# Patient Record
Sex: Male | Born: 1965 | Race: White | Hispanic: No | Smoking: Current every day smoker
Health system: Southern US, Community
[De-identification: ages and names within clinical notes are randomized; demographics above are authoritative.]

## PROBLEM LIST (undated history)

## (undated) DIAGNOSIS — K3184 Gastroparesis: Secondary | ICD-10-CM

## (undated) DIAGNOSIS — I255 Ischemic cardiomyopathy: Secondary | ICD-10-CM

## (undated) DIAGNOSIS — I509 Heart failure, unspecified: Secondary | ICD-10-CM

## (undated) DIAGNOSIS — E119 Type 2 diabetes mellitus without complications: Secondary | ICD-10-CM

## (undated) DIAGNOSIS — Z9114 Patient's other noncompliance with medication regimen: Secondary | ICD-10-CM

## (undated) DIAGNOSIS — I340 Nonrheumatic mitral (valve) insufficiency: Secondary | ICD-10-CM

## (undated) DIAGNOSIS — E785 Hyperlipidemia, unspecified: Secondary | ICD-10-CM

## (undated) DIAGNOSIS — I10 Essential (primary) hypertension: Secondary | ICD-10-CM

## (undated) DIAGNOSIS — I639 Cerebral infarction, unspecified: Secondary | ICD-10-CM

## (undated) DIAGNOSIS — I219 Acute myocardial infarction, unspecified: Secondary | ICD-10-CM

## (undated) DIAGNOSIS — K219 Gastro-esophageal reflux disease without esophagitis: Secondary | ICD-10-CM

## (undated) DIAGNOSIS — K599 Functional intestinal disorder, unspecified: Secondary | ICD-10-CM

## (undated) DIAGNOSIS — I214 Non-ST elevation (NSTEMI) myocardial infarction: Secondary | ICD-10-CM

## (undated) DIAGNOSIS — Z91148 Patient's other noncompliance with medication regimen for other reason: Secondary | ICD-10-CM

## (undated) DIAGNOSIS — G458 Other transient cerebral ischemic attacks and related syndromes: Secondary | ICD-10-CM

## (undated) DIAGNOSIS — I236 Thrombosis of atrium, auricular appendage, and ventricle as current complications following acute myocardial infarction: Secondary | ICD-10-CM

## (undated) DIAGNOSIS — I2510 Atherosclerotic heart disease of native coronary artery without angina pectoris: Secondary | ICD-10-CM

## (undated) HISTORY — DX: Hyperlipidemia, unspecified: E78.5

## (undated) HISTORY — DX: Acute myocardial infarction, unspecified: I21.9

## (undated) HISTORY — DX: Thrombosis of atrium, auricular appendage, and ventricle as current complications following acute myocardial infarction: I23.6

## (undated) HISTORY — DX: Other transient cerebral ischemic attacks and related syndromes: G45.8

## (undated) HISTORY — DX: Heart failure, unspecified: I50.9

## (undated) HISTORY — DX: Type 2 diabetes mellitus without complications: E11.9

## (undated) HISTORY — DX: Patient's other noncompliance with medication regimen for other reason: Z91.148

## (undated) HISTORY — DX: Atherosclerotic heart disease of native coronary artery without angina pectoris: I25.10

## (undated) HISTORY — DX: Nonrheumatic mitral (valve) insufficiency: I34.0

## (undated) HISTORY — DX: Functional intestinal disorder, unspecified: K59.9

## (undated) HISTORY — DX: Gastroparesis: K31.84

## (undated) HISTORY — DX: Patient's other noncompliance with medication regimen: Z91.14

## (undated) HISTORY — DX: Essential (primary) hypertension: I10

## (undated) HISTORY — DX: Ischemic cardiomyopathy: I25.5

---

## 2000-04-16 ENCOUNTER — Encounter: Payer: Self-pay | Admitting: Neurosurgery

## 2000-04-16 ENCOUNTER — Ambulatory Visit (HOSPITAL_COMMUNITY): Admission: RE | Admit: 2000-04-16 | Discharge: 2000-04-16 | Payer: Self-pay | Admitting: Neurosurgery

## 2000-04-23 ENCOUNTER — Encounter: Payer: Self-pay | Admitting: Neurosurgery

## 2000-04-23 ENCOUNTER — Ambulatory Visit (HOSPITAL_COMMUNITY): Admission: RE | Admit: 2000-04-23 | Discharge: 2000-04-23 | Payer: Self-pay | Admitting: Neurosurgery

## 2000-05-14 ENCOUNTER — Ambulatory Visit (HOSPITAL_COMMUNITY): Admission: RE | Admit: 2000-05-14 | Discharge: 2000-05-14 | Payer: Self-pay | Admitting: Neurosurgery

## 2000-05-14 ENCOUNTER — Encounter: Payer: Self-pay | Admitting: Neurosurgery

## 2000-05-28 ENCOUNTER — Encounter: Payer: Self-pay | Admitting: Neurosurgery

## 2000-05-28 ENCOUNTER — Ambulatory Visit (HOSPITAL_COMMUNITY): Admission: RE | Admit: 2000-05-28 | Discharge: 2000-05-28 | Payer: Self-pay | Admitting: Neurosurgery

## 2004-07-02 HISTORY — PX: CORONARY ANGIOPLASTY WITH STENT PLACEMENT: SHX49

## 2004-09-13 ENCOUNTER — Inpatient Hospital Stay (HOSPITAL_COMMUNITY): Admission: AD | Admit: 2004-09-13 | Discharge: 2004-09-20 | Payer: Self-pay | Admitting: Cardiology

## 2004-09-13 ENCOUNTER — Ambulatory Visit: Payer: Self-pay | Admitting: Cardiology

## 2004-09-13 DIAGNOSIS — I219 Acute myocardial infarction, unspecified: Secondary | ICD-10-CM

## 2004-09-13 HISTORY — DX: Acute myocardial infarction, unspecified: I21.9

## 2004-09-14 ENCOUNTER — Encounter: Payer: Self-pay | Admitting: Cardiology

## 2004-09-22 ENCOUNTER — Ambulatory Visit: Payer: Self-pay | Admitting: Internal Medicine

## 2004-09-22 ENCOUNTER — Observation Stay (HOSPITAL_COMMUNITY): Admission: AD | Admit: 2004-09-22 | Discharge: 2004-09-22 | Payer: Self-pay | Admitting: Cardiology

## 2004-09-24 ENCOUNTER — Inpatient Hospital Stay (HOSPITAL_COMMUNITY): Admission: EM | Admit: 2004-09-24 | Discharge: 2004-09-30 | Payer: Self-pay | Admitting: Emergency Medicine

## 2004-09-24 ENCOUNTER — Ambulatory Visit: Payer: Self-pay | Admitting: *Deleted

## 2004-10-11 ENCOUNTER — Ambulatory Visit: Payer: Self-pay | Admitting: Cardiology

## 2004-10-23 ENCOUNTER — Ambulatory Visit: Payer: Self-pay | Admitting: Cardiology

## 2004-10-26 ENCOUNTER — Ambulatory Visit: Payer: Self-pay | Admitting: Cardiology

## 2004-11-17 ENCOUNTER — Ambulatory Visit: Payer: Self-pay | Admitting: Cardiology

## 2005-07-02 HISTORY — PX: CORONARY ARTERY BYPASS GRAFT: SHX141

## 2005-08-30 ENCOUNTER — Ambulatory Visit: Payer: Self-pay | Admitting: Cardiology

## 2005-10-02 ENCOUNTER — Ambulatory Visit: Payer: Self-pay | Admitting: Internal Medicine

## 2005-10-03 ENCOUNTER — Inpatient Hospital Stay (HOSPITAL_COMMUNITY): Admission: EM | Admit: 2005-10-03 | Discharge: 2005-10-04 | Payer: Self-pay | Admitting: Emergency Medicine

## 2005-10-03 ENCOUNTER — Encounter: Payer: Self-pay | Admitting: Cardiology

## 2005-10-04 ENCOUNTER — Encounter: Payer: Self-pay | Admitting: Vascular Surgery

## 2005-10-12 ENCOUNTER — Inpatient Hospital Stay (HOSPITAL_COMMUNITY): Admission: RE | Admit: 2005-10-12 | Discharge: 2005-10-16 | Payer: Self-pay | Admitting: Surgery

## 2005-12-25 ENCOUNTER — Encounter: Admission: RE | Admit: 2005-12-25 | Discharge: 2005-12-25 | Payer: Self-pay | Admitting: Surgery

## 2006-01-04 ENCOUNTER — Ambulatory Visit: Payer: Self-pay | Admitting: Cardiology

## 2006-01-11 ENCOUNTER — Ambulatory Visit: Payer: Self-pay | Admitting: Cardiology

## 2006-01-16 ENCOUNTER — Ambulatory Visit: Payer: Self-pay | Admitting: Cardiology

## 2006-02-04 ENCOUNTER — Ambulatory Visit: Payer: Self-pay | Admitting: Cardiology

## 2006-02-19 ENCOUNTER — Ambulatory Visit: Payer: Self-pay | Admitting: Cardiology

## 2006-02-27 ENCOUNTER — Ambulatory Visit: Payer: Self-pay | Admitting: Cardiology

## 2006-03-26 ENCOUNTER — Ambulatory Visit: Payer: Self-pay | Admitting: Cardiology

## 2006-04-12 ENCOUNTER — Ambulatory Visit: Payer: Self-pay | Admitting: Cardiology

## 2006-07-11 ENCOUNTER — Ambulatory Visit: Payer: Self-pay | Admitting: Cardiology

## 2006-09-04 ENCOUNTER — Encounter: Payer: Self-pay | Admitting: Cardiology

## 2006-09-11 ENCOUNTER — Ambulatory Visit: Payer: Self-pay | Admitting: Cardiology

## 2008-05-10 ENCOUNTER — Encounter: Payer: Self-pay | Admitting: Cardiology

## 2009-08-18 ENCOUNTER — Emergency Department (HOSPITAL_COMMUNITY): Admission: EM | Admit: 2009-08-18 | Discharge: 2009-08-18 | Payer: Self-pay | Admitting: Emergency Medicine

## 2009-12-24 ENCOUNTER — Encounter: Payer: Self-pay | Admitting: Cardiology

## 2010-01-21 ENCOUNTER — Emergency Department (HOSPITAL_COMMUNITY)
Admission: EM | Admit: 2010-01-21 | Discharge: 2010-01-21 | Payer: Self-pay | Source: Home / Self Care | Admitting: Emergency Medicine

## 2010-03-07 ENCOUNTER — Encounter: Payer: Self-pay | Admitting: Cardiology

## 2010-03-13 ENCOUNTER — Encounter: Payer: Self-pay | Admitting: Cardiology

## 2010-04-05 ENCOUNTER — Encounter: Payer: Self-pay | Admitting: Cardiology

## 2010-05-24 ENCOUNTER — Ambulatory Visit: Payer: Self-pay | Admitting: Cardiology

## 2010-05-24 DIAGNOSIS — E1165 Type 2 diabetes mellitus with hyperglycemia: Secondary | ICD-10-CM

## 2010-05-24 DIAGNOSIS — F17200 Nicotine dependence, unspecified, uncomplicated: Secondary | ICD-10-CM

## 2010-05-24 DIAGNOSIS — R0602 Shortness of breath: Secondary | ICD-10-CM

## 2010-05-24 DIAGNOSIS — Z951 Presence of aortocoronary bypass graft: Secondary | ICD-10-CM

## 2010-05-24 DIAGNOSIS — K219 Gastro-esophageal reflux disease without esophagitis: Secondary | ICD-10-CM

## 2010-05-24 DIAGNOSIS — E7800 Pure hypercholesterolemia, unspecified: Secondary | ICD-10-CM

## 2010-05-24 DIAGNOSIS — I509 Heart failure, unspecified: Secondary | ICD-10-CM | POA: Insufficient documentation

## 2010-05-24 DIAGNOSIS — I7389 Other specified peripheral vascular diseases: Secondary | ICD-10-CM | POA: Insufficient documentation

## 2010-05-24 DIAGNOSIS — I2510 Atherosclerotic heart disease of native coronary artery without angina pectoris: Secondary | ICD-10-CM | POA: Insufficient documentation

## 2010-05-24 DIAGNOSIS — Z9119 Patient's noncompliance with other medical treatment and regimen: Secondary | ICD-10-CM

## 2010-05-24 DIAGNOSIS — I252 Old myocardial infarction: Secondary | ICD-10-CM

## 2010-05-24 DIAGNOSIS — I5022 Chronic systolic (congestive) heart failure: Secondary | ICD-10-CM | POA: Insufficient documentation

## 2010-05-24 DIAGNOSIS — I10 Essential (primary) hypertension: Secondary | ICD-10-CM | POA: Insufficient documentation

## 2010-05-24 DIAGNOSIS — J449 Chronic obstructive pulmonary disease, unspecified: Secondary | ICD-10-CM

## 2010-05-29 ENCOUNTER — Telehealth: Payer: Self-pay | Admitting: Cardiology

## 2010-05-29 ENCOUNTER — Encounter: Payer: Self-pay | Admitting: Cardiology

## 2010-06-09 ENCOUNTER — Encounter: Payer: Self-pay | Admitting: Cardiology

## 2010-07-23 ENCOUNTER — Encounter: Payer: Self-pay | Admitting: Surgery

## 2010-07-28 LAB — CBC
HCT: 45.8 % (ref 39.0–52.0)
MCV: 87.6 fL (ref 78.0–100.0)
Platelets: 135 10*3/uL — ABNORMAL LOW (ref 150–400)
RBC: 5.23 MIL/uL (ref 4.22–5.81)
WBC: 9.1 10*3/uL (ref 4.0–10.5)

## 2010-07-28 LAB — COMPREHENSIVE METABOLIC PANEL
ALT: 20 U/L (ref 0–53)
Albumin: 4 g/dL (ref 3.5–5.2)
CO2: 28 mEq/L (ref 19–32)
Calcium: 9.6 mg/dL (ref 8.4–10.5)
Creatinine, Ser: 1.09 mg/dL (ref 0.4–1.5)
Glucose, Bld: 202 mg/dL — ABNORMAL HIGH (ref 70–99)
Potassium: 4.4 mEq/L (ref 3.5–5.1)
Total Protein: 6.6 g/dL (ref 6.0–8.3)

## 2010-07-31 ENCOUNTER — Ambulatory Visit (HOSPITAL_COMMUNITY)
Admission: RE | Admit: 2010-07-31 | Discharge: 2010-07-31 | Payer: Self-pay | Source: Home / Self Care | Attending: Oral Surgery | Admitting: Oral Surgery

## 2010-07-31 LAB — GLUCOSE, CAPILLARY: Glucose-Capillary: 200 mg/dL — ABNORMAL HIGH (ref 70–99)

## 2010-08-01 NOTE — Letter (Signed)
Summary: External Correspondence/ OFFICE VISIT [facility]  External Correspondence/ OFFICE VISIT [facility]   Imported By: Dorise Hiss 04/06/2010 10:46:49  _____________________________________________________________________  External Attachment:    Type:   Image     Comment:   External Document

## 2010-08-01 NOTE — Op Note (Signed)
NAME:  Edwin White, Edwin White NO.:  [id number]  MEDICAL RECORD NO.:  [id number]          PATIENT TYPE:  AMB  LOCATION:  SDS                          FACILITY:  [facility]  PHYSICIAN:  Georgia Lopes, M.D.  DATE OF BIRTH:  [birth year]  DATE OF PROCEDURE:  07/31/2010 DATE OF DISCHARGE:  07/31/2010                              OPERATIVE REPORT   PREOPERATIVE DIAGNOSIS:  Dental caries, nonrestorable teeth #2, #3, #4, #5, #6, #7, #11, #12, #13, #14, #15, #19, #20, #21, #22, #23, #24, #25, #26, #27, #28, #29, #30, and #31.  POSTOPERATIVE DIAGNOSIS:  Dental caries, nonrestorable teeth #2, #3, #4, #5, #6, #7, #11, #12, #13, #14, #15, #19, #20, #21, #22, #23, #24, #25, #26, #27, #28, #29, #30 and #31.  PROCEDURES:  Removal of teeth #2, #3, #4, #5, #6, #7, #11, #12, #13, #14, #15, #19, #20, #21, #22, #23, #24, #25, #26, #27, #28, #29, #30 and #31, alveoplasty maxilla and mandible.  SURGEON:  Georgia Lopes, MD  ASSISTANTS:  __________.  ANESTHESIA:  General nasal, Dr. Gypsy Balsam attending.  INDICATIONS FOR PROCEDURE:  [name] is a 45 year old white male with extensive medical history of recurrent congestive heart failure, ischemic cardiomyopathy with an ejection fraction 15% to 20%, coronary artery disease, mitral regurg, type 2 diabetes, history of apical thrombus, history of multiple TIAs who was referred to my office for removal of all remaining teeth by his general dentist.  Because of the complexity of the surgery, the number of teeth we removed, and the patient's poor medical condition, it was recommended that the patient go to [facility] where he could be intubated and the airway protected and have anesthesiologist monitor the patient during surgery.  PROCEDURE:  The patient was taken to the operating room, placed on the table in a supine position.  General anesthesia was administered intravenously and nasal endotracheal tube was placed and secured.  The eyes were  protected.  Throat pack was suctioned after the patient was draped and then a throat pack was placed.  2% lidocaine 1:100,000 epinephrine was infiltrated in an inferior alveolar block on the right and left side and in maxillary buccal and palatal infiltration, a total of 19 mL was utilized.  A bite block was placed on the right side of the mouth and left side was operated first.  A #15 blade was used to make a full-thickness incision around teeth #19, #20, #21, #22, #23, #24, #25, and #26 in the mandible and around teeth #11, #12, #13, #14, #15 in the maxilla.  The periosteum was reflected around these teeth and then a striker handpiece with a fissure bur was used to remove bone air proximally between teeth #11, #12, #13, #14, #15, #19, #20, #21, #22. Then the teeth were elevated with a 301 elevator and the teeth were removed in the mandible with the #150 universal forceps and in the maxilla with a #150 universal forceps.  The sockets were then curetted, the periosteum was reflected further to expose the alveolar ridge, then alveoplasty was performed with an egg-shaped bur and a bone file and the areas were irrigated  and closed with 3-0 chromic.  The bite block was repositioned.  Attention was turned to the right side of the mouth.  A #15 blade was used to make a full-thickness incision around teeth #27, #28, #29, #30, #31 and #2, #3, #4, #5, #6, #7.  The periosteum was reflected and then bone was removed in approximately between the posterior teeth and the maxilla and mandible.  Then the 301 elevator was used to elevate teeth.  The lower teeth were then removed with a #151 forceps and the maxillary teeth were removed with the #150 forceps. Sockets were then curetted.  The periosteum was further reflected to expose the alveolar crest in both incision areas and then the egg-shaped bur and bone file was used to perform alveoplasty.  The areas were irrigated and then closed with 3-0 chromic.   The oral cavity was then palpated, inspected, and found to have good contour and good closure and then the posterior pharynx was suctioned.  Throat pack was removed.  The patient was taken to the recovery room extubated, breathing spontaneously in good condition.  ESTIMATED BLOOD LOSS:  Minimum.  COMPLICATIONS:  None.  SPECIMENS:  None.     Georgia Lopes, M.D.     SMJ/MEDQ  D:  07/31/2010  T:  08/01/2010  Job:  [id number]  Electronically Signed by Ocie Doyne M.D. on 08/01/2010 07:22:43 AM

## 2010-08-01 NOTE — Progress Notes (Signed)
Summary: PHONE: PAIN IN LEGS   Phone Note Call from Patient Call back at Home Phone [phone]   Caller: Joana Reamer- cell # [phone] Reason for Call: Talk to Doctor Summary of Call: Had ABI's done today. Mr. Gasper thought he was suppose to have an Echo but we see no mention of this. Patient is now having pain in both legs from hips down. Want to know if he can have something for pain. Patient has started taking Lortab 500 mg that was given to [facility] in [place], [place] please call French Ana on her cell # [phone] Initial call taken by: Zachary George,  May 29, 2010 3:16 PM  Follow-up for Phone Call        Discussed above with wife.  Advised her that ABI results not back yet.  Also thought you mentioned him having echo done. Hoover Brunette, LPN  May 30, 2010 10:35 AM   Additional Follow-up for Phone Call Additional follow up Details #1::        CALLED TODAY FOR ABI RESULTS    RETURN CALL ON CELL [phone] Dorise Hiss  June 02, 2010 2:51 PM Additional Follow-up by: Dorise Hiss,  June 02, 2010 2:41 PM    Additional Follow-up for Phone Call Additional follow up Details #2::    call patient to reassured that ABI results are normal. Follow-up by: Lewayne Bunting, MD, [facility],  June 09, 2010 9:17 AM  Additional Follow-up for Phone Call Additional follow up Details #3:: Details for Additional Follow-up Action Taken: Does patient need to have echo?   Was planning to have dental work done.  Thinks he was suppose to from conversation during OV.  Dictation states echo "at some point" .  Hoover Brunette, LPN  June 09, 2010 11:15 AM  Does not need echo for dental work at this point. Can be addressed next OV.  Lewayne Bunting, MD, [facility]  June 09, 2010 11:28 AM  Patient notified.    Hoover Brunette, LPN  June 09, 2010 11:41 AM

## 2010-08-01 NOTE — Letter (Signed)
Summary: External Correspondence/ [facility]ICE [facility]IT [facility]  External Correspondence/ [facility]ICE [facility]   Imported By: Dorise Hiss 04/06/2010 10:48:22  _____________________________________________________________________  External Attachment:    Type:   Image     Comment:   External Document

## 2010-08-01 NOTE — Letter (Signed)
Summary: External Correspondence/ MEDICAL  CLEARANCE DR. [facility]  External Correspondence/ MEDICAL  CLEARANCE DR. [facility]   Imported By: Dorise Hiss 05/04/2010 15:35:47  _____________________________________________________________________  External Attachment:    Type:   Image     Comment:   External Document

## 2010-08-01 NOTE — Medication Information (Signed)
Summary: RX Folder/ [facility]  RX Folder/ [facility]   Imported By: Dorise Hiss 04/06/2010 11:03:44  _____________________________________________________________________  External Attachment:    Type:   Image     Comment:   External Document

## 2010-08-01 NOTE — Letter (Signed)
Summary: Letter/ DR. Sheliah Plane FOR MEDICAL CLEARANCE  Letter/ DR. Sheliah Plane FOR MEDICAL CLEARANCE   Imported By: Dorise Hiss 06/09/2010 12:16:05  _____________________________________________________________________  External Attachment:    Type:   Image     Comment:   External Document

## 2010-08-01 NOTE — Assessment & Plan Note (Signed)
Summary: EST- NEEDS SURGICAL CLEARANCE   Visit Type:  Pre-op Evaluation Primary Edwin White:  Dr. Franchot Mimes   History of Present Illness: the patient has an ischemic cardiomyopathy with prior coronary artery bypass grafting. He also has right middle cerebral artery stenosis not amenable to interventional procedure. Laboratory work from August 2011 shows triglycerides of 218 cholesterol 206 HDL cholesterol 35 LDL cholesterol 127. echocardiogram from 2009 shows an ejection fraction of 35%. This was done the patient's primary care office. No chf admissions. No scp or sob.  Leg pain last few months. Legs hurting when walking. Nu,mess/neuropathy in hands.  Feels likes walking on needles. Poor dentition-ull dental extraction. Needs clearance. A I and exercise BI. Only right dorsal is pedis is palpable. Feet feel cold all the time. Echo, increase lisinopril, ABI's ICD decision Plavix DC 7 days before.   Preventive Screening-Counseling & Management  Alcohol-Tobacco     Smoking Status: current     Packs/Day: 1.0  Comments: started smoking at 45 yrs old  Current Medications (verified): 1)  Furosemide 80 Mg Tabs (Furosemide) .... Take 1 Tablet By Mouth Once A Day 2)  Aspirin Ec 325 Mg Tbec (Aspirin) .... Take 2  Tablet By Mouth Daily 3)  Glyburide 5 Mg Tabs (Glyburide) .... Take 1 Tablet By Mouth One To 2 Times Per Day 4)  Lisinopril 20 Mg Tabs (Lisinopril) .... Take 1 Tablet By Mouth Once A Day 5)  Plavix 75 Mg Tabs (Clopidogrel Bisulfate) .... Take One Tablet By Mouth Daily 6)  Pravastatin Sodium 20 Mg Tabs (Pravastatin Sodium) .... Take One Tablet By Mouth Daily At Bedtime 7)  Nifedipine 30 Mg Xr24h-Tab (Nifedipine) .... Take 1 Tablet By Mouth Once A Day 8)  Potassium Chloride Crys Cr 20 Meq Cr-Tabs (Potassium Chloride Crys Cr) .... Take One Tablet By Mouth Daily 9)  Bisoprolol-Hydrochlorothiazide 5-6.25 Mg Tabs (Bisoprolol-Hydrochlorothiazide) .... Take 1 Tablet By Mouth Once A Day 10)   Lantus Solostar 100 Unit/ml Soln (Insulin Glargine) .... Inject Subcutaneously As Needed As Directed  Allergies (verified): No Known Drug Allergies  Comments:  Nurse/Medical Assistant: The patient's medications were reviewed with the patient and were updated in the Medication List. Pt brought medication bottles to office visit.  Cyril Loosen, RN, BSN (May 24, 2010 1:50 PM)  Past History:  Past Medical History: Last updated: 05/24/2010  1.  Recurrent congestive heart failure.      1.  Currently compensated.  2.  Severe ischemic cardiomyopathy.      1.  Ejection fraction 15-20% by current echocardiogram.  3.  Coronary artery disease.      1.  Status post three vessel coronary artery bypass graft April 2007:          left internal mammary artery to left anterior descending; saphenous          vein graft to first obtuse marginal; saphenous vein graft to right          coronary artery.  4.  Medication noncompliance.      1.  Currently reporting compliance.  5.  Ongoing tobacco.  6.  Moderate mitral regurgitation.  7.  Type 2 diabetes mellitus.  8.  History of apical thrombus.      1.  Previously treated with Coumadin.  9.  History of multiple transient ischemic attacks.      1.  Secondary to right middle cerebral artery stenosis - not amendable          to intervention, treated with Plavix.  10.  Hyperlipidemia. 11. MI September 13, 2004 and had a Cypher stent  Past Surgical History: Last updated: 05/24/2010 CABG Cardiac Catheterization  Social History: Last updated: 05/24/2010 Disabled  Tobacco Use - Yes.   Risk Factors: Smoking Status: current (05/24/2010) Packs/Day: 1.0 (05/24/2010)  Social History: Packs/Day:  1.0  Review of Systems       The patient complains of muscle weakness and joint pain.  The patient denies fatigue, malaise, fever, weight gain/loss, vision loss, decreased hearing, hoarseness, chest pain, palpitations, shortness of breath, prolonged  cough, wheezing, sleep apnea, coughing up blood, abdominal pain, blood in stool, nausea, vomiting, diarrhea, heartburn, incontinence, blood in urine, leg swelling, rash, skin lesions, headache, fainting, dizziness, depression, anxiety, enlarged lymph nodes, easy bruising or bleeding, and environmental allergies.    Vital Signs:  Patient profile:   45 year old male Height:      72 inches Weight:      233.25 pounds BMI:     31.75 Pulse rate:   74 / minute BP sitting:   128 / 87  (left arm) Cuff size:   regular  Vitals Entered By: Cyril Loosen, RN, BSN (May 24, 2010 1:41 PM)  Nutrition Counseling: Patient's BMI is greater than 25 and therefore counseled on weight management options. Comments Surgical clearance for dental work. Pt will have teeth pulled with anes. C/o legs hurting (all the time) for the last 3 months. Concerned about BP staying up despite taking meds. Also wants to know about the blood clot in the back of his head.    Physical Exam  Additional Exam:  General: Well-developed, well-nourished in no distress head: Normocephalic and atraumatic eyes PERRLA/EOMI intact, conjunctiva and lids normal nose: No deformity or lesions mouthpoor dentition normal posterior pharynx neck: Supple, no JVD.  No masses, thyromegaly or abnormal cervical nodes lungs: Normal breath sounds bilaterally without wheezing.  Normal percussion heart: regular rate and rhythm with normal S1 and S2, no S3 or S4.  PMI is normal.  No pathological murmurs abdomen: Normal bowel sounds, abdomen is soft and nontender without masses, organomegaly or hernias noted.  No hepatosplenomegaly musculoskeletal: Back normal, normal gait muscle strength and tone normal pulsus: unable to palpate pulses Extremities: No peripheral pitting edema neurologic: Alert and oriented x 3 skin: Intact without lesions or rashes cervical nodes: No significant adenopathy psychologic: Normal affect    Impression &  Recommendations:  Problem # 1:  POSTSURGICAL AORTOCORONARY BYPASS STATUS (ICD-V45.81) the patient is status post coronary artery bypass grafting.  He has no recurrent chest pain.  He started on Plavix which can be discontinued prior to full mouth dental extraction.  Typically Plavix does not need to be held for dental extraction but given the fact it is a full mouth dental extraction certainly this is indicated.  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  He appears to have claudication.  We will order ABIs.  Problem # 3:  CHRONIC SYSTOLIC HEART FAILURE (ICD-428.22) the patient has LV systolic dysfunction and increase his lisinopril.  At some point will need a follow-up echocardiogram and decide on ICD. The following medications were removed from the medication list:    Nitrostat 0.4 Mg Subl (Nitroglycerin) .Marland Kitchen... 1 tablet under tongue at onset of chest pain; you may repeat every 5 minutes for up to 3 doses. His updated medication list for this problem includes:    Furosemide 80  Mg Tabs (Furosemide) .Marland Kitchen... Take 1 tablet by mouth once a day    Aspirin Ec 325 Mg Tbec (Aspirin) .Marland Kitchen... Take 2  tablet by mouth daily    Lisinopril 20 Mg Tabs (Lisinopril) .Marland Kitchen... Take 1 tablet by mouth once a day    Plavix 75 Mg Tabs (Clopidogrel bisulfate) .Marland Kitchen... Take one tablet by mouth daily    Nifedipine 30 Mg Xr24h-tab (Nifedipine) .Marland Kitchen... Take 1 tablet by mouth once a day    Bisoprolol-hydrochlorothiazide 5-6.25 Mg Tabs (Bisoprolol-hydrochlorothiazide) .Marland Kitchen... Take 1 tablet by mouth once a day  Orders: ABI (ABI)  Other Orders: EKG w/ Interpretation (93000)  Patient Instructions: 1)  ABI's  2)  Stop Plavix 7 days before dental surgery 3)  Increase Lisinopril to 20mg  daily 4)  Follow up in  6 months.   Prescriptions: LISINOPRIL 20 MG TABS (LISINOPRIL) Take 1 tablet by mouth once a day  #30 x 6    Entered by:   Hoover Brunette, LPN   Authorized by:   Lewayne Bunting, MD, [facility]   Signed by:   Hoover Brunette, LPN on 93/23/5573   Method used:   Electronically to        [facility]  [address] (retail)       [address]       [place], [place]  [address]       Ph: [phone]       Fax: [phone]   RxID:   [id number]

## 2010-09-21 LAB — COMPREHENSIVE METABOLIC PANEL
ALT: 21 U/L (ref 0–53)
BUN: 14 mg/dL (ref 6–23)
CO2: 23 mEq/L (ref 19–32)
Calcium: 9.6 mg/dL (ref 8.4–10.5)
Creatinine, Ser: 0.91 mg/dL (ref 0.4–1.5)
GFR calc non Af Amer: >60 mL/min (ref >60)
Glucose, Bld: 263 mg/dL — ABNORMAL HIGH (ref 70–99)
Total Protein: 7 g/dL (ref 6.0–8.3)

## 2010-09-21 LAB — APTT: aPTT: 28 seconds (ref 24–37)

## 2010-09-21 LAB — CBC
HCT: 45.3 % (ref 39.0–52.0)
Hemoglobin: 15.8 g/dL (ref 13.0–17.0)
MCHC: 34.8 g/dL (ref 30.0–36.0)
MCV: 91.7 fL (ref 78.0–100.0)
MCV: 91.7 fL (ref 78.0–100.0)
Platelets: 167 10*3/uL (ref 150–400)
RBC: 4.94 MIL/uL (ref 4.22–5.81)
RDW: 13.8 % (ref 11.5–15.5)
RDW: 13.8 % (ref 11.5–15.5)
WBC: 10.5 10*3/uL (ref 4.0–10.5)

## 2010-09-21 LAB — POCT CARDIAC MARKERS
CKMB, poc: <1 ng/mL — ABNORMAL LOW (ref 1.0–8.0)
Troponin i, poc: <0.05 ng/mL (ref 0.00–0.09)
Troponin i, poc: <0.05 ng/mL (ref 0.00–0.09)

## 2010-09-21 LAB — PROTIME-INR
INR: 0.98 (ref 0.00–1.49)
Prothrombin Time: 12.9 seconds (ref 11.6–15.2)

## 2010-09-21 LAB — DIFFERENTIAL
Basophils Absolute: 0.1 10*3/uL (ref 0.0–0.1)
Eosinophils Absolute: 0 10*3/uL (ref 0.0–0.7)
Eosinophils Absolute: 0.1 10*3/uL (ref 0.0–0.7)
Lymphocytes Relative: 20 % (ref 12–46)
Lymphocytes Relative: 21 % (ref 12–46)
Lymphs Abs: 2.1 10*3/uL (ref 0.7–4.0)
Lymphs Abs: 2.2 10*3/uL (ref 0.7–4.0)
Monocytes Relative: 4 % (ref 3–12)
Neutro Abs: 8.1 10*3/uL — ABNORMAL HIGH (ref 1.7–7.7)
Neutrophils Relative %: 73 % (ref 43–77)
Neutrophils Relative %: 76 % (ref 43–77)

## 2010-09-21 LAB — D-DIMER, QUANTITATIVE: D-Dimer, Quant: 0.4 ug/mL-FEU (ref 0.00–0.48)

## 2010-09-21 LAB — BASIC METABOLIC PANEL
BUN: 15 mg/dL (ref 6–23)
Creatinine, Ser: 0.87 mg/dL (ref 0.4–1.5)
GFR calc non Af Amer: >60 mL/min (ref >60)
Glucose, Bld: 302 mg/dL — ABNORMAL HIGH (ref 70–99)

## 2010-11-17 NOTE — Consult Note (Signed)
NAME:  Edwin White, Edwin White NO.:  [id number]   MEDICAL RECORD NO.:  [id number]           PATIENT TYPE:   LOCATION:                                 FACILITY:   PHYSICIAN:  Theodore Demark, P.A. LHCDATE OF BIRTH:  1966/02/17   DATE OF CONSULTATION:  DATE OF DISCHARGE:  09/30/2004                                   CONSULTATION   PRIMARY CARDIOLOGIST:  Jonelle Sidle, M.D. [facility]   PRIMARY CARE PHYSICIAN:  Dr. Sherryll Burger.   CHIEF COMPLAINT:  Left-sided weakness.   SECONDARY DIAGNOSES:  1.  Coronary artery disease, status post Cypher stent to the ostial LAD in      2006 secondary to anterior wall myocardial infarction.  2.  Ischemic cardiomyopathy with an EF of 20% and an apical thrombus at      cath.  3.  Diabetes.  4.  Hypertension.  5.  Gastroesophageal reflux disease symptoms.  6.  History of sternal fracture.  7.  History of priapism.  8.  Hyperlipidemia.  9.  History of back surgery.  10. Occasional melena and hematuria.   PROCEDURE:  1.  MRI/MRA of the brain.  2.  CT of the head without contrast.  3.  Carotid Doppler's.   HOSPITAL COURSE:  [name] is a 45 year old male with a history of  coronary artery disease.  He had an MI September 13, 2004 and had a Cypher stent  to the LAD.  He had a left upper extremity and lower extremity weakness  since leaving the hospital and his symptoms worsened.  He was admitted for  further evaluation and treatment and a neurological consult was called.   He was seen by neurology and evaluated by Dr. Thad Ranger.  His exam was normal  at the time of the dictation but Dr. Thad Ranger felt that because the MRI and  MRA showed focal segmental area of severe stenosis involving the right  middle cerebral artery he felt that the patient could be having hemodynamic  right hemispheric TIA's.  An interventional radiology consult was called.   [name] was seen by Dr. Corliss Skains regarding cerebral angiogram with  possible angioplasty of the  right cerebral artery.  This was performed on  September 29, 2004.  There was a stump of the proximal right middle cerebral  artery with extensive lenticular strip laterals opacifying the trifurcation  branches of the right middle cerebral artery which are attenuated.  The  tight focal stenosis of the left posterior cerebral artery was noted  proximally.  No percutaneous intervention was done.   [name] was anticoagulated with heparin and Coumadin.  The plan was for  full cross coverage but on September 30, 2004 [name] left the hospital against  medical advice.  His INR at that time was 1.4.  He was given instructions on  Coumadin and it is hoped that he will make and keep followup appointments.  Of note he had been prescribed Coumadin 10 mg a day prior to admission and  his INR on admission was therapeutic at 3.0.   [name] left AMA  on September 30, 2004.      Theodore Demark, P.A. [facility]     RB/MEDQ  D:  05/23/2005  T:  05/24/2005  Job:  [id number]   cc:   Casimiro Needle L. Thad Ranger, M.D.  Fax: [phone]   Kirstie Peri, MD  Fax: [phone]

## 2010-11-17 NOTE — H&P (Signed)
NAME:  Edwin White, CUFF NO.:  [id number]   MEDICAL RECORD NO.:  [id number]          PATIENT TYPE:  INP   LOCATION:  2308                         FACILITY:  [facility]   PHYSICIAN:  Jonelle Sidle, M.D. LHCDATE OF BIRTH:  15-Jul-1965   DATE OF ADMISSION:  09/13/2004  DATE OF DISCHARGE:                                HISTORY & PHYSICAL   PRIMARY CARE PHYSICIAN:  Dr. Kirstie Peri in [facility].   CHIEF COMPLAINT:  Chest pain.   HISTORY OF PRESENT ILLNESS:  [name] is a 45 year old male with a  reported history of hypertension, type 2 diabetes mellitus, dyslipidemia,  gastroesophageal reflux disease, and ongoing tobacco use. He states that he  awoke in the early morning hours with chest pressure at approximately 3 a.m.  There was associated radiation of discomfort to his arms and also shortness  of breath. These symptoms waxed and waned overnight. However, he got up this  morning and went to work as usual. At the urging of his employer and family,  he ultimately presented to the emergency department at [facility]. He was noted to have Q waves in the anteroseptal leads and just  under a millimeter of ST segment elevation in leads V1-V3. This represented  a change from previous tracing in October 2005 and was also associated with  an elevated troponin I level of 14. He was placed on heparin, Integrilin,  and also treated with aspirin and nitroglycerin. He is now transported to  our facility in anticipation of diagnostic angiography. On arrival in the  unit he was complaining of mild residual chest pressure and his  electrocardiogram continues to show minimal ST segment elevation in leads V1-  V5 with Q waves in leads V1-V3. After discussing the risks and benefits of  diagnostic coronary angiography with an eye towards revascularization, he is  being transported to the cardiac catheterization laboratory in anticipation  of such.   ALLERGIES:  No known drug allergies.   PRESENT MEDICATIONS:  These are not entirely clear at this time. He takes  Glucovance and Actos, as well as a medication for reflux disease and some  type of medication for cholesterol.   PAST MEDICAL HISTORY:  1.  Type 2 diabetes mellitus.  2.  Dyslipidemia.  3.  Hypertension.  4.  Gastroesophageal reflux disease.  5.  History of previous injury in the 1980s with subsequent sternal      fracture.  6.  Status post reported presentation with priapism associated reportedly      with sickle cell, treated at [facility] in [place].      I do not have any other specifics in this regard at this time.  7.  No clearly documented prior history of coronary artery disease or      myocardial infarction.   SOCIAL HISTORY:  The patient works at [employer] in [place], [place]. He has a one-pack-per-day tobacco use history for several years.   FAMILY HISTORY:  Significant for premature cardiovascular disease. The  patient states that his father died  between the ages of 75 and 62 with heart  disease.   REVIEW OF SYMPTOMS:  As described in history of present illness. He has had  intermittent chest discomfort for the past week, although thought it may  have been due to reflux. He has had no fevers or chills, cough, hemoptysis,  melena, hematochezia, peripheral edema, palpitations, or syncope.   PHYSICAL EXAMINATION:  VITAL SIGNS:  Temperature is 98.4 degrees, heart rate  90 in sinus rhythm, blood pressure is 114/51, oxygen saturation is 97% on  room air.  GENERAL:  This is an overweight male lying supine in no acute distress.  NECK:  Reveals no elevated jugular venous pressure or bilateral carotid  bruits. No thyromegaly is noted.  LUNGS:  Clear to auscultation bilaterally with nonlabored breathing at rest.  CARDIAC:  Reveals a regular rate and rhythm with a soft S3 but no  pericardial rub or loud murmur.  ABDOMEN:  Soft,  nontender, with normal active bowel sounds.  EXTREMITIES:  Show no pitting edema. Peripheral pulses are 2+.   LABORATORY DATA:  From [facility] shows a glucose of 214,  BUN 8, creatinine 0.9, sodium 138, potassium 4.7, chloride 105, bicarb 26.  Initial CK 974, CK-MB 92, relative index 9.4%, troponin I 14.15. Wbc's 12.5,  hemoglobin 16.1, hematocrit 46.2, platelets 208. INR 1.0.   Chest x-ray is pending.   IMPRESSION:  1.  Acute coronary syndrome/anterior wall myocardial infarction with symptom      onset approximately 3 a.m. Mild residual chest pressure remains.  2.  Type 2 diabetes mellitus.  3.  Hypertension.  4.  Dyslipidemia.  5.  Ongoing tobacco use history.  6.  Reported history of gastroesophageal reflux disease.  7.  Reported history of priapism associated with sickle cell apparently      evaluated at [facility] in [place], specifics not      available.   PLAN:  1.  The patient will be transported urgently to the cardiac catheterization      lab for diagnostic angiography with an eye towards revascularization.      The risks and benefits have been explained to the patient and he agrees      to proceed.  2.  Will continue aspirin, Integrilin, and heparin for the time being.  3.  Smoking cessation consult.  4.  Follow up lipid status.  5.  Need to obtain outpatient medication list and update appropriately.  6.  Further plans to follow.      SGM/MEDQ  D:  09/13/2004  T:  09/13/2004  Job:  [id number]   cc:   Kirstie Peri, MD  [address]  [place] [address]  Fax: [phone]

## 2010-11-17 NOTE — Op Note (Signed)
NAME:  CAIUS, SILBERNAGEL NO.:  [id number]   MEDICAL RECORD NO.:  [id number]          PATIENT TYPE:  INP   LOCATION:  3704                         FACILITY:  [facility]   PHYSICIAN:  Salvadore Farber, M.D. LHCDATE OF BIRTH:  Nov 24, 1965   DATE OF PROCEDURE:  09/22/2004  DATE OF DISCHARGE:  09/22/2004                                 OPERATIVE REPORT   PROCEDURE:  Thrombin injection of right groin pseudoaneurysm.   INDICATION:  Mr. Klich is a 45 year old gentleman who suffered anterior  myocardial infarction one week ago.  Underwent angiography and percutaneous  revascularization of the LAD.  He has had persistent groin pain since.  He  has been managed on Coumadin, Plavix, and aspirin.  This morning he was seen  at our office in [place].  INR was 7.5.  CT done at [facility]  demonstrated right groin pseudoaneurysm.  He was referred to [facility] for  definitive therapy.  Ultrasound demonstrated a reasonable neck on the  aneurysm.  I decided to proceed with thrombin injection.   PROCEDURAL TECHNIQUE:  Using ultrasound guidance, after prepping the site  with Betadine and local anesthesia with 1% lidocaine, a spinal needle was  advanced into the pseudoaneurysm cavity.  Entry into the cavity was  confirmed by ultrasound.  Approximately 500 IU of thrombin was injected into  the cavity.  This resulted in prompt thrombosis of the entirety of the  pseudoaneurysm cavity.  Ultrasound confirmed persistent flow in the common  femoral vein and common femoral artery.   COMPLICATIONS:  None.   IMPRESSION/RECOMMENDATIONS:  Successful thrombin injection of right groin  pseudoaneurysm.  Will discharge home later today with pain medicines.      WED/MEDQ  D:  09/22/2004  T:  09/24/2004  Job:  [id number]

## 2010-11-17 NOTE — H&P (Signed)
NAME:  ASA, [name] NO.:  [id number]   MEDICAL RECORD NO.:  [id number]          PATIENT TYPE:  OBV   LOCATION:  1823                         FACILITY:  [facility]   PHYSICIAN:  Creta Levin, M.D. LHCDATE OF BIRTH:  [birth year]   DATE OF ADMISSION:  09/24/2004  DATE OF DISCHARGE:                                HISTORY & PHYSICAL   PHYSICIANS:  1.  Jonelle Sidle, M.D., primary cardiologist.  2.  Kirstie Peri, M.D., primary care physician.   CHIEF COMPLAINT:  Left-sided weakness.   HISTORY OF PRESENT ILLNESS:  Edwin White is a 45 year old male with a recent  inferior MI who presents with left upper extremity and left lower extremity  weakness. He states that he has had these symptoms since leaving the  hospital but they have gotten significantly worse since earlier today. He  does not describe them very well but he does admit to some difficulty with  walking being the major problem. In fact, it sounds like he really  describing some gait instability/incoordination. He has had no visual  disturbance, dysarthria, dysphagia, sensory deficits, or paresthesias. He  states that he did have some mild substernal chest discomfort that was  atypical but similar to that when he had his myocardial infarction. He also  states that he was seen in clinic on Friday and was told that his INR was  7.5 but that no changes were made to his Coumadin regimen.   PAST MEDICAL HISTORY:  1.  Coronary artery disease.      1.  Acute myocardial infarction on September 13, 2004 treated with primary          PCI. He received a 3.0 x 18 mm Cypher stent to the ostial LAD.  2.  Ischemic cardiomyopathy with an ejection fraction of 20-30%.  3.  Possible apical thrombus.  4.  Diabetes.  5.  Gastroesophageal reflux disease.  6.  History of a sternal fracture.  7.  History of priapism in the past, at which time he was told that he has      sickle cell trait (details are unknown).  8.   Hyperlipidemia.   ALLERGIES:  No known drug allergies.   MEDICATIONS:  1.  Coumadin 10 mg q.h.s.  2.  Lipitor 80 mg q.h.s.  3.  Glucovance 5/500 mg q.d.  4.  Actos 30 mg q.d.  5.  Aspirin 81 mg q.d.  6.  Plavix 75 mg q.d.  7.  Coreg 6.25 mg b.i.d.  8.  Lisinopril 20 mg q.d.  9.  Aldactone 25 mg q.d.  10. Nicotine patch 7 mg q.d.  11. Aciphex 20 mg q.d.  12. Nitroglycerin p.r.n., although he has not needed it.   SOCIAL HISTORY:  [place]. He works at  [employer]. He has a 24-pack-year smoking history and is now smoking  one-quarter pack per day. He has three boys-ages 11, 3, and 2. He denies  alcohol or illicit drug use. He does not use herbal medications.   FAMILY HISTORY:  His mother  is alive in her 64s, although he does not know  much about her. His father died between age 35 and 48 of coronary disease.  He states he has two brothers with coronary disease in their 40s and one  sister with coronary disease in her 56s.   REVIEW OF SYMPTOMS:  Positive for chest pain that is described as mild  epigastric/chest discomfort associated with belching, shortness of breath,  and weakness/gait instability. He also complains of right groin pain and was  recently seen back in the emergency department for a groin hematoma and  vascular complication that may have been treated with thrombin injection.   PHYSICAL EXAMINATION:  VITAL SIGNS:  Temperature is 98.5, blood pressure  119/73, heart rate is 86, respiratory rate is 18.  GENERAL:  In general, he is a poorly kempt 45 year old male in no acute  distress.  HEENT:  Generally unremarkable. His dentition was poor.  NECK:  JVP normal. Carotid upstroke normal. No bruits.  LUNGS:  Clear.  CARDIOVASCULAR:  Regular rate and rhythm with a nondisplaced PMI. He did  have an S3. He had no audible murmurs.  ABDOMEN:  Soft, nontender, and nondistended without masses or bruits.  GENITOURINARY:  He had  diffuse ecchymosis involving his right vascular  access site. This appeared stable. It was tender. There was no bruit.  EXTREMITIES:  No clubbing, cyanosis, or edema. His distal pulses were  strong.  NEUROLOGICAL:  Cranial nerves II through XII intact. He was alert and  oriented x 4. He had normal motor and sensory exams. Deep tendon reflexes  were equal throughout. He had slightly abnormal finger-to-nose but otherwise  normal cerebellar and proprioceptive tests.   LABORATORY DATA:  Chest x-ray is currently pending. ECG showed a normal  sinus rhythm with low anterior forces and some nonspecific ST changes. He  did have an IVCD. He had no obvious injury or ischemia.   His labs thus far show a white count of 17.2 with 85% neutrophils, the  hemoglobin 13.6, hematocrit 38.9, platelets 240,000. Sodium 135, potassium  4.7, chloride 102, bicarbonate 31, BUN 13, creatinine 0.8, glucose 131. PTT  71, PT 24.3, with an INR of 3.0.   ASSESSMENT/PLAN:  1.  Possible transient ischemic attack:  Dr. Doug Sou evaluated the      patient in the emergency department and contacted Dr. Melvyn Novas.      She felt that the patient would best be admitted to the cardiology      service. We will ask for her assistance in the morning but I have a      feeling that this is not very typical for a transient ischemic attack.      If it is a transient ischemic attack, then I would presume that is      involving the posterior circulation. I will get a MRI/MRA to evaluate      this anatomy. Unfortunately, I am not sure what we would do if it were a      transient ischemic attack. Certainly with the possibility of an apical      thrombus, then thromboembolism could be implicated. With an INR of 3.0,      I am not sure that we could do much more to prevent this from happening.      He will be brought in for a 23-hour observation. 2.  Cardiovascular:  He did complain of chest discomfort that is a bit       similar to  when he had his myocardial infarction, although I do not see      anything on the initial echocardiogram. He will be ruled out for      myocardial infarction. His current cardiac medications will be continued      at their current doses. At some point, he will need to be evaluated for      an ICD; however, given that he is less than 40 days out from his      myocardial infarction, it is premature. More over, with the possibility      of a thrombus, EFT testing would be quite challenging and I am not sure      if it would be safe.  3.  Groin bleeding:  This has been stable. What is interesting is he did      receive some oxycodone for pain management. I wonder if this is      contributing to his atypical neurologic complaints.  4.  Diabetes:  He will be continued on Actos and sliding scale insulin. With      the severity of his left ventricular dysfunction, however, I am hesitant      to continue to the Glucophage.      RPK/MEDQ  D:  09/24/2004  T:  09/24/2004  Job:  [id number]

## 2010-11-17 NOTE — Assessment & Plan Note (Signed)
[facility]                            [facility] OFF[facility] NOTE   Edwin, White                  MRN:          [id number]  DATE:02/04/2006                            DOB:          01/01/1966    Edwin White is a 45 year old, Caucasian gentleman, who returns today for  followup status post recent hospitalization for congestive heart failure  exacerbation in setting of severe ischemic cardiomyopathy with an EF of 15  to 20% status post coronary artery bypass grafting in April of this year.  [name] also has a history of medical noncompliance and ongoing tobacco  use.  He states he has been feeling slightly better since his  hospitalization.  He complains of dyspnea on exertion with mild activity.  He becomes fatigued very quickly.  He denies any orthopnea, PND, or  peripheral edema.  Denies any episodes of angina.  He complains of dyspnea  on exertion and general fatigue more than anything else.  He stays compliant  with his medications and abstinence from dietary sodium.  Unfortunately, he  continues to smoke a half a pack a day at this time.   PAST MEDICAL HISTORY:  1.  Congestive heart failure/ischemic cardiomyopathy, EF of 15 to 20% by a      current echocardiogram done on July the 13th.  2.  Coronary artery disease status post coronary artery bypass grafting x3      in April of 2007.  3.  Medical noncompliance.  4.  Ongoing tobacco abuse.  5.  Moderate mitral regurgitation.  6.  Type 2 diabetes.  7.  History of apical thrombus previously treated with Coumadin.  8.  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  9.  Hyperlipidemia.  10. GERD.  11. Chronic obstructive pulmonary disease.   REVIEW OF SYSTEMS:  As stated above in History of Present Illness.   ALLERGIES:  No allergies documented at this time.   CURRENT  MEDICATIONS:  1.  Plavix 75 mg daily.  2.  Aspirin 81 mg daily.  3.  Glyburide 5 mg daily.  4.  Lisinopril 20 mg daily.  5.  Lovastatin 10 mg daily.  6.  Bisoprolol/HCTZ 2.5/6.25 mg daily.  7.  Metformin 500 mg b.i.d.  8.  Aldactone 25 mg daily.  9.  Lasix 40 mg b.i.d.   PHYSICAL EXAMINATION:  VITAL SIGNS:  Blood pressure 104/60, heart rate 86,  weight 256 pounds.  GENERAL:  [name] is in no acute distress.  HEENT:  Normocephalic, atraumatic.  Pupils are equal, round, and react to  light.  Sclerae are clear.  NECK:  Supple without lymphadenopathy, negative JVD at 45 degree angle.  LUNGS:  Slightly decreased in bilateral bases, otherwise clear to  auscultation.  CARDIOVASCULAR:  S1 and S2, a regular rate and rhythm.  ABDOMEN:  Soft and nontender, positive bowel sounds.  LOWER EXTREMITIES:  Without clubbing, cyanosis, or edema.  The patient has  2+ DP bilaterally.  NEUROLOGICAL:  The  patient is alert and oriented x3.  Cranial nerves II-XII  grossly intact at this time.   IMPRESSION:  Class II congestive heart failure with an ejection fraction of  15%.  The patient is currently on 2.5 of Bisoprolol.  Will increase dose to  5 mg today.  Check a BMET and a BNP as patient is on Aldactone and  diuretics.  Will see patient back in four weeks.                                   Dorian Pod, ACNP   MB/MedQ  DD:  02/04/2006  DT:  02/04/2006  Job #:  [id number]

## 2010-11-17 NOTE — Assessment & Plan Note (Signed)
[facility]                            [facility] NOTE   Edwin White, Edwin White                  MRN:          [id number]  DATE:02/19/2006                            DOB:          Nov 21, 1965    HISTORY OF PRESENT ILLNESS:  Patient is a 45 year old male with ischemic  cardiomyopathy with an ejection fraction 10-15% status post bypass surgery  in April 2007.  The patient has had continued problems with volume overload  post surgery.  He was last seen 02/04/06 when his weight was at 256 pounds.  He now comes in complaining of increased shortness of breath with his weight  up seven pounds to 263 pounds.  Patient states that he feels tight in his  hands and legs and has some abdominal swelling.  He has increased shortness  of breath, denies any chest pain.  The patient is not compliant with his  medical regiment.  The patient states that he is compliant, however, with  his fluid restriction.  He continues to smoke, unfortunately and has  requested medication to discontinue tobacco.  However, patient cannot afford  Chantix at this point in time.   MEDICATIONS:  As listed and does include Plavix 75 mg, aspirin one tablet a  day, Glyburide 5 mg a day, Lovastatin 10 mg po daily, Metformin 500 bid,  Aldactone 25 mg po daily, Lasix 40 mg po bid, hydrochlorothiazide 5/6.25 mg  a day.   PHYSICAL EXAMINATION:  VITAL SIGNS:  Blood pressure 138/90, heart rate 110  beats per minute, weight 263 pounds.  NECK:  No thrush.  No carotid bruits.  LUNGS:  Clear breath sounds bilaterally.  HEART:  Tachycardiac, normal S1/S2. S3 is audible.  No pathologic murmurs.  ABDOMEN:  Soft, non-tender.  EXTREMITIES:  No cyanosis, clubbing or edema.  12-lead echocardiogram:  Sinus tachycardia, left atrial enlargement, non-  specific T wave changes.   PROBLEM:  1. Ischemic cardiomyopathy ejection fraction 10-15%.  2. Coronary artery disease status post bypass  grafting April 2007.  3. Medical noncompliance.  4. Congestive heart failure with recurrent volume overload.  5. Mitral regurgitation.  6. Ongoing tobacco.  7. Type 2 diabetes mellitus.  8. History of apical thrombus treated with Coumadin.  9. 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  10.Lipidemia.  11.GERD.  12.Chronic obstructive pulmonary disease.   PLAN:  1. The patient continues to be non-compliant with his medical regimen.  He      has gained significant amount of weight and we discussed fluid      restriction again in the office today.  2. It is possible the patient's congestive heart failure symptoms have      worsened by the increase in __________  recently.  I have asked him to      increase his Lasix to 80 mg in the morning and 40 mg in the evening.      Unfortunately he does not know which is his diuretic and so he will      come back in  the morning and bring his medications to straighten this      out.  3. The patient will followup with [place] again in four weeks.  We need to keep      a close eye on his fluid management.  4. I have given the patient a prescription for Xanax prn to help him with      his anxiety related to his tobacco use.  5. The patient also will need to be provided with a scale at home to      monitor his weight.                                   Learta Codding, MD, [facility]   GED/MedQ  DD:  02/19/2006  DT:  02/20/2006  Job #:  [id number]

## 2010-11-17 NOTE — Discharge Summary (Signed)
NAME:  Edwin White, Edwin White NO.:  [id number]   MEDICAL RECORD NO.:  [id number]          PATIENT TYPE:  INP   LOCATION:  2036                         FACILITY:  [facility]   PHYSICIAN:  Edwin White, M.D. LHCDATE OF BIRTH:  September 24, 1965   DATE OF ADMISSION:  09/13/2004  DATE OF DISCHARGE:  09/20/2004                           DISCHARGE SUMMARY - REFERRING   PROCEDURES:  Emergent coronary angiography/Cypher stenting, left anterior  descending September 13, 2004.   REASON FOR ADMISSION:  Edwin White is a 45 year old male with no current  history of heart disease but with multiple cardiac risk factors who  initially presented to [facility] emergency room with findings  suggestive of acute coronary syndrome.  He was referred to Dr. Simona White,  and following stabilization, arrangements were made for direct transfer to  [facility] for emergent cardiac catheterization.  Please refer to  dictated admission note for full details.   LABORATORY DATA:  INR 2.2 at discharge (preceded by 1.7).  Potassium 4.0,  glucose 101, BUN 8, creatinine 0.8 prior to discharge, hematocrit 36,  platelets 193,000 prior to discharge, hemoglobin A1c 9.1.  Lipid profile:  Total cholesterol 201, triglyceride 208, HDL 29, LDL 130 (cholesterol/HCL  ratio 6.9).  Cardiac enzymes:  CPK 824/44.5, BNP 252.   HOSPITAL COURSE:  Following transfer from [facility] emergency room,  the patient was taken directly to the cardiac catheterization lab undergoing  emergent groin angiography by Dr. Randa Evens (see report for full  details), revealing an ostial 90% LAD lesion and severe cardiomyopathy (EF  20%) with severe hypokinesis of all areas except __________.   Dr. Samule Ohm proceeded with successful Cypher stenting of the high grade  ostial LAD lesion with no noted complications.   Recommendations to treat with Plavix for at least one year, aspirin  indefinitely.   Postoperative course  notable for gradual up titration of medications as  blood pressure allowed.  Initially,  beta blocker was deferred given the  conflicts of acute congestive heart failure.   Postprocedure echocardiogram suggested ejection fraction of no greater than  30%.  There was a question of a target in the apex, and Dr. Myrtis Ser was  unable to rule out presence of an apical clot.   The patient was started on Lovenox and Coumadin.   The patient continues to do well postprocedure with no complaint of chest  pain.  He was tolerating up titration of various medications.  At the time  of discharge, Captopril was changed to Lisinopril.  Regarding  anticoagulation, the patient was to receive one last dose of Lovenox prior  to discharge; INR 2.2 at time of discharge.  The patient will have an early  follow up pro time in our [place] clinic.  The patient was also referred to  case management for medication assistance.  He is on Medicaid.  The patient  was also referred for smoking cessation consultation and was started on a  nicotine patch prior to discharge.   DISCHARGE MEDICATIONS:  1.  Coumadin 10 mg daily.  2.  Lisinopril 20 mg daily.  3.  Coreg 6.25 mg b.i.d.  4.  Plavix 75 mg daily.  5.  Coated aspirin 325 mg daily.  6.  Glucovance 5/500 mg daily.  7.  Actos 30 mg daily.  8.  Aciphex 20 mg daily.  9.  Lipitor 80 mg daily.  10. Nicotine patch 7 mg/24 hours as directed.  11. Aldactone 25 mg daily.  12. Nitrostat 0.4 mg as directed.   DISCHARGE INSTRUCTIONS:  1.  No strenuous activity until seen by physician.  2.  Patient instructed to refrain from nonsteroidal products.  3.  Strongly urged to stop smoking tobacco.  4.  Maintain low-fat, low-cholesterol, diabetic diet.   FOLLOWUP:  1.  The patient is scheduled for a follow up pro time on Friday, March 24,      at 8:30 a.m. at Dr. Ival Bible office.  2.  The patient is scheduled to follow up with Dr. Simona White on Friday,      April 7, at 2:30  p.m. at [facility] in [place].  3.  The patient is instructed to follow up with his primary care physician,      Dr. Kirstie Peri, for continued management of his diabetes __________.   DISCHARGE DIAGNOSES:  1.  Status post acute anterior wall myocardial infarction.      1.  In emergency room, Cypher stenting on 90% ostial left anterior          descending September 13, 2004.      2.  Severe left ventricular dysfunction, ejection fraction approximately          20%.      3.  Small right groin hematoma.  2.  Question apical thrombus.  3.  Dyslipidemia.  4.  Type 2 diabetes mellitus.  5.  Tobacco.  6.  Hypertension.  7.  Gastroesophageal reflux disease.      GS/MEDQ  D:  09/20/2004  T:  09/20/2004  Job:  [id number]   cc:   Kirstie Peri, MD  [address]  [place] [address]  Fax: [phone]   [facility]  [address]  [address]  [place], [place] [address]

## 2010-11-17 NOTE — Assessment & Plan Note (Signed)
[facility]                          [facility] OFFICE NOTE   KOBEN, DAMAN                  MRN:          [id number]  DATE:09/11/2006                            DOB:          December 17, 1965    REFERRING PHYSICIAN:  Kirstie Peri, MD   REASON FOR VISIT:  Evaluation of congestive heart failure.   HISTORY OF THE PRESENT ILLNESS:  The patient is a 45 year old white male  with a history of large myocardial infarction status post coronary  bypass grafting in April 2007 secondary to multivessel disease.  The  patient has known left ventricular dysfunction with an ejection fraction  of 15-20%.  He also has a prior history of a TIA secondary to right  middle cerebral artery stenosis.   The patient states that most recently he developed cough and congestion  and he has been taking Allegra D unfortunately.  His blood pressure is  not markedly elevated, it is 196/122.  He went to see Dr. Sherryll Burger a couple  of days ago and an electrocardiogram.  This reportedly did not show any  acute changes.  The patient states that he has chest pain upon raking  leaves. He has exertional dyspnea.  He also reports pain in the left jaw  area related to poor dentition and probably a tooth abscess.   The patient ran out of his Lasix and has noncompliant with his drug  regimen.   MEDICATIONS:  1. Lisinopril 40 mg by mouth daily.  2. bisoprolol /hydrochlorothiazide 5/6.25 daily.  3. Plavix 75 mg by mouth daily.  4. Aspirin otherwise dose 81 mg every day.  5. Glyburide 5 mg per day.  6. Aldosterone 25 mg by mouth daily.  7. Potassium is at 30 mg by mouth daily.  8. Lasix 80 mg I daily; however, the patient ran out of this two days      ago.  9. Omeprazole 20 mg by mouth daily.  10.Insulin 30 units subcutaneously daily.   PHYSICAL EXAMINATION:  VITAL SIGNS:  Blood pressure is 196/122, heart  rate 73, O2 saturation 97% on room air, and weight is 261 pounds, which  has not changed from prior visit.  GENERAL APPEARANCE:  In general this is a well-nourished white male in  no apparent distress, but  complaining of congestion.  HEENT:  Pupils anisocoria.  Pharynx is clear.  NECK:  The neck is supple.  Normal carotid upstroke. No carotid bruits.  LUNGS:  The lungs have clear breath sounds bilaterally with a few  rhonchi.  HEART:  The patient's heart has a regular rate and rhythm with sinus  rhythm, and no murmurs, rubs or gallops.  ABDOMEN:  The abdomen is soft.  EXTREMITIES:  The extremity exam shows no cyanosis, clubbing or edema.   PROBLEM LIST:  1. Coronary artery disease:      a.     Status post myocardial infarction.      b.     Status post coronary artery bypass graft in April 2007 with       multivessel disease.  2. History of congestive heart failure with a ejection  fraction of      20%.  3. Severe hypertension, uncontrolled.  4. History of multiple transient ischemic attacks secondary to right      middle cerebral artery stenosis; stable.  5. Dyslipidemia.  6. Diabetes.  7. Chronic obstructive pulmonary disease.  8. Problems with compliance.  9. Presumed tooth abscess with left periauricular adenopathy.  10.Chronic stable angina.   PLAN:  1. The patient will need aggressive blood pressure control.  I have      added nifedipine 30 mg by mouth, longacting, to his medical      regimen.  2. I also refilled his Lasix which the patient ran out of two days      ago.  3. The patient will go to the mall to have his blood pressure checked      on a regular basis and make sure that it becomes under control      rather quickly.  4. I have advised the patient to stop taking Allegra D due to my      concern for it causing worsening hypertension.  5. The patient definitely has a tooth abscess and I have asked him to      urgently call the dentist and to make an appointment.  In the      interim I have provided him with pen-VK 500 mg by mouth  three times      a day.  6. The patient will follow up with [place] in one month.     Learta Codding, MD,FACC  Electronically Signed    GED/MedQ  DD: 09/11/2006  DT: 09/13/2006  Job #: [id number]   cc:   Kirstie Peri, MD

## 2010-11-17 NOTE — Assessment & Plan Note (Signed)
[facility]                            [facility] OFFICE NOTE   CHIMA, ASTORINO                  MRN:          [id number]  DATE:01/16/2006                            DOB:          1966/04/07    PRIMARY CARDIOLOGIST:  Dr. Lewayne Bunting.   REASON FOR OFFICE VISIT:  Edwin White now presents for early post hospital  followup.   The patient was recently hospitalized here at [facility] and seen in  consultation by Dr. Andee Lineman on July 13th for management of recurrent  congestive heart failure in the setting of known medication noncompliance.   The patient was placed on aggressive diuretic regimen with instructions to  be discharged on Lasix (he was not on this prior to admission), and now  presents for followup.   The patient reports having been hospital for only 24 hours  and that he has  since been feeling much better with no recurrent symptoms of orthopnea or  paroxysmal nocturnal dyspnea.  The patient reports compliance with his  medications and abstinence from dietary sodium.  Unfortunately, he continues  to smoke but he is currently down to a half a pack a day.   CURRENT MEDICATIONS:  1.  Plavix.  2.  Aspirin 81 daily.  3.  Metformin 500 b.i.d.  4.  Glyburide 5 daily.  5.  Lisinopril 20 daily.  6.  Lovastatin 10 daily.  7.  Bisoprolol/HCTZ 2.5/6.25 daily.  8.  Lasix 40 b.i.d.  9.  Aldactone 25 q.d.   PHYSICAL EXAMINATION:  VITAL SIGNS:  Blood pressure 112/72, pulse 90,  regular, weight 251 (down 6 pounds from recent office visit).  GENERAL:  A 45 year old male, sitting upright, in no respiratory distress.  NECK:  Palpable carotid pulses without bruits; no JVD at 90 degrees.  LUNGS:  Clear to auscultation in all fields.  HEART:  Regular rate and rhythm (S1, S2).  No significant murmurs.  EXTREMITIES:  Palpable dorsalis pedis pulses with trace pedal edema.  NEURO:  No focal deficits.   IMPRESSION:  1.  Recurrent congestive heart  failure.      1.  Currently compensated.  2.  Severe ischemic cardiomyopathy.      1.  Ejection fraction 15-20% by current echocardiogram.  3.  Coronary artery disease.      1.  Status post three vessel coronary artery bypass graft April 2007:          left internal mammary artery to left anterior descending; saphenous          vein graft to first obtuse marginal; saphenous vein graft to right          coronary artery.  4.  Medication noncompliance.      1.  Currently reporting compliance.  5.  Ongoing tobacco.  6.  Moderate mitral regurgitation.  7.  Type 2 diabetes mellitus.  8.  History of apical thrombus.      1.  Previously treated with Coumadin.  9.  History of multiple transient ischemic attacks.      1.  Secondary to right middle cerebral artery stenosis - not amendable  to intervention, treated with Plavix.  10. Hyperlipidemia.   PLAN:  At this point, [name] is presenting with compensated congestive  heart failure and reporting compliance with his medications.  He is  clinically stable with no current overt signs or symptoms suggestive of  decompensated heart failure but will continue to need close monitoring here  in the clinic.  Therefore, we have schedule him to return in four weeks to  follow up with Dr. Andee Lineman.  In the interim, we will check a followup BMET  today for close monitoring of electrolytes and renal function, given that he  was just discharged on both Lasix and Aldactone.   Of note, the patient continues to maintain compliance with his medications,  we will then entertain the possibility of referring him for consideration  AICD implantation for treatment of his severe ischemic cardiomyopathy.                                   Gene Serpe, PA-C                                Learta Codding, MD, [facility]   GS/MedQ  DD:  01/16/2006  DT:  01/16/2006  Job #:  [id number]   cc:   Kirstie Peri, MD

## 2010-11-17 NOTE — Discharge Summary (Signed)
NAME:  Edwin White, Edwin White NO.:  [id number]   MEDICAL RECORD NO.:  [id number]          PATIENT TYPE:  INP   LOCATION:  2013                         FACILITY:  [facility]   PHYSICIAN:  Edwin White, M.D.     DATE OF BIRTH:  1965-08-06   DATE OF ADMISSION:  10/12/2005  DATE OF DISCHARGE:  10/16/2005                                 DISCHARGE SUMMARY   PRIMARY ADMITTING DIAGNOSIS:  Severe three-vessel coronary artery disease.   ADDITIONAL/DISCHARGE DIAGNOSES:  1.  Severe three-vessel coronary artery disease.  2.  Status post myocardial infarction, status post percutaneous transluminal      coronary angioplasty and stent to the left anterior descending artery in      March 2006.  3.  Hypertension.  4.  Type 2 noninsulin-dependent diabetes.  5.  Gastroesophageal reflux disease.  6.  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  7.  History of chronic back problems status post surgery.  8.  History of tobacco abuse.   PROCEDURES PERFORMED:  1.  Coronary artery bypass grafting x3 (saphenous vein graft to the first      obtuse marginal, saphenous vein graft to the distal right coronary      artery, left internal mammary artery to the LAD).  2.  Endoscopic vein harvest to the right leg.   HISTORY:  The patient is a 45 year old male with a known history of coronary  artery disease.  He is status post a previous anterior myocardial infarction  in March 2006 at which time he underwent PTCA and drug-eluting stent to the  LAD.  On October 02, 2005, he presented to the emergency department at [facility] with a two-week history of exertional chest pain.  He ultimately  ruled out for myocardial infarction.  He underwent cardiac catheterization  on October 03, 2005 which showed severe three-vessel coronary artery disease.  Left  ventricular ejection fraction was estimated at 45-50% with mild  anterior hypokinesis and no mitral regurgitation.  An echo was also  performed which showed an ejection fraction of 20% with anterior apical  akinesis.  Because of his significant disease and recurrent symptoms, he was  referred to Dr. Evelene White for outpatient consideration of surgical  revascularization.  Dr. Laneta White felt that CABG was his best treatment and  because of his history of intracranial vascular disease a preoperative  neurological consultation was obtained.  All the risks, benefits and  alternatives of surgery were explained to the patient and his family and  they agreed to proceed.  Prior to surgery, an MR angiogram was performed of  the brain which showed no significant change from his scan approximately one  year ago.  He is scheduled for outpatient admission to [facility]  on October 12, 2005 to proceed with surgery.   HOSPITAL COURSE:  Edwin White was admitted to [facility] on October 12, 2005 and was taken to  the operating room where he underwent CABG x3 as  described in detail above performed by Dr. Laneta White.  He tolerated the  procedure well and was transferred to the SICU in stable condition.  He was  able to be extubated shortly after surgery.  He was hemodynamically stable  and doing well on postoperative day #1.  At that time, his hemodynamic  monitoring devices and chest tubes were removed.  He was kept in the unit  for further observation overnight and by postoperative day #2 was stable and  ready for transfer to the floor.  Postoperatively, he has done very well.  His blood pressures have been trending upward and he has started on a beta  blocker.  He has also been somewhat volume overloaded and has been started  on Lasix and potassium supplementation.  He is ambulating the halls with  cardiac rehab phase one and is making good progress.  He has been seen by  the tobacco cessation  counselor and was given educational information on  quitting smoking.  He is tolerating a regular diet and is having normal  bowel and bladder function.  His surgical incision sites were healing well.  He is maintaining normal sinus rhythm.  His blood sugars were starting to  trend upward as well.  He has been restarted on his home doses of glyburide  and metformin at this time.   His labs on the morning of October 15, 2005 show hemoglobin of 11.8,  hematocrit 33.8, platelets 113, white count 10.5.  Sodium 137, potassium 3.6  which has been supplemented, BUN 8, creatinine 0.9.  His most recent chest x-  ray showed perihilar atelectasis.  It is anticipated that if he continues to  progress well over the next 24-48 hours and no acute changes occur he will  hopefully be ready for discharge home by October 16, 2005.   DISCHARGE MEDICATIONS:  1.  Aspirin 325 mg q.d.  2.  Zebeta 2.5 mg q.d.  3.  Lovastatin 10 mg q.h.s.  4.  Lasix 40 mg q.d. x1 week.  5.  K-Dur 20 mEq q.d. x1 week.  6.  Plavix 75 mg q.d.  7.  Glyburide 5 mg q.d.  8.  Metformin 500 mg b.i.d.  9.  Zantac 150 mg q.d.  10. Tylox 1-2 q.4-6h. p.r.n. for pain.   DISCHARGE INSTRUCTIONS:  He is asked to refrain from driving, heavy lifting  or strenuous activity.  He may continue ambulating daily and using his  incentive spirometer.  He may shower daily and clean his incisions with soap  and water.  He will continue low-fat, low-sodium, carbohydrate modified  diet.   DISCHARGE FOLLOWUP:  He will make an appointment see Dr. Gala White in 2  weeks and have a chest x-ray at that visit.  He will then see Dr. Laneta White on  Nov 06, 2005 at 11:45 a.m.  He will contact our office in the interim if he  experiences any problems or has questions.      Edwin White, P.A.      Edwin White, M.D.  Electronically Signed    GC/MEDQ  D:  10/15/2005  T:  10/16/2005  Job:  [id number]   cc:   Arvilla Meres, M.D. [facility]  [employer] [address]  [place]  [place] [address]   Kirstie Peri, MD  Fax: [phone]

## 2010-11-17 NOTE — Consult Note (Signed)
NAME:  Edwin White, Edwin White                ACCOUNT NO.:  [id number]   MEDICAL RECORD NO.:  [id number]          PATIENT TYPE:  OBV   LOCATION:  3701                         FACILITY:  [facility]   PHYSICIAN:  Sanjeev K. Deveshwar, M.D.DATE OF BIRTH:  10/04/1965   DATE OF CONSULTATION:  09/26/2004  DATE OF DISCHARGE:                                   CONSULTATION   CHIEF COMPLAINT:  The patient to be evaluated for cerebral angiogram and  possible angioplasty/stent of the right middle cerebral artery.   BRIEF HISTORY:  We were asked to see this 45 year old male admitted to [facility] on September 24, 2004 with TIA-like symptoms.  The patient  recently had a large anterior MI on September 13, 2004, treated with a Cypher  stent to the LAD.  His ejection fraction at that time was noted to be 20% to  30%.  The patient was also noted to have a question of an apical thrombus.  He was placed on Coumadin, aspirin, and Plavix.   Since discharge from the hospital, the patient has had multiple episodes of  left-sided weakness, consistent with TIAs.  This is despite therapeutic  anticoagulation on Coumadin with the addition of the aspirin and Plavix.  The patient was seen in consultation by Dr. Thad Ranger.  An MRA/MRI was  performed on September 25, 2004 that showed severe stenosis of the right middle  cerebral artery with acute to subacute ischemic changes of the right  cerebral hemisphere.  There was no aneurysm noted.  Dr. Thad Ranger requested a  consult from Dr. Corliss Skains for possible cerebral angiogram and intervention.   PAST MEDICAL HISTORY:  1.  Diabetes mellitus.  2.  Hyperlipidemia.  3.  Hypertension.  4.  Gastroesophageal reflux disease.  5.  Coronary artery disease, with the above-noted recent MI.  6.  The patient also has a history of a sternal fracture.  7.  A history of priapism in the past, reportedly due to sickle cell trait.  8.  He is status post surgery for a pilonidal cyst.   ALLERGIES:   No known drug allergies.   CURRENT MEDICATIONS:  1.  Aspirin 81 mg daily.  2.  Lipitor 80 mg daily.  3.  Coreg 6.25 mg b.i.d.  4.  Plavix 75 mg daily.  5.  Novolin regular insulin.  6.  Lisinopril 10 mg daily.  7.  Nicotine patches.  8.  Protonix 40 mg daily.  9.  Actos 30 mg daily.  10. Spironolactone 25 mg daily.  11. Coumadin was discontinued today.   SOCIAL HISTORY:  The patient is married.  He has 3 sons.  He lives in [place]  with his wife.  He has a 24 pack year history of tobacco use.  He does not  use alcohol.  He works for [employer].   FAMILY HISTORY:  His mother is alive in her 17s.  His father died in his 51s  from complications of coronary artery disease.  He has 2 brothers and 1  sister with early coronary disease.   REVIEW OF SYSTEMS:  Totally  negative, except for recent weight loss,  diaphoresis on the day of admission, recent sore throat, and dyspnea on  exertion.  He had a groin hematoma following his stent placement.  He has  had some symptoms of indigestion and mild epigastric discomfort.  He has had  the above-noted left-sided weakness.  He denies seizures, aphasia,  dysarthria, dysphagia, diplopia, numbness.   LABORATORY DATA:  Hemoglobin was 14.3, hematocrit 42, RBC's 4.38, WBC's  17.2, platelets 240,000, and INR today of 3.4.  PTT was 91, BUN 13,  creatinine 0.8, potassium 4.7, sodium 135, glucose 131.  Troponin was 0.5.  CK-MB was 83, and an MB of 1.7.   PHYSICAL EXAMINATION:  GENERAL:  A pleasant 45 year old white male with  somewhat of a flat affect, but no acute distress.  VITAL SIGNS:  Blood pressure 127/70, pulse 91, respirations 20, temperature  99.  Oxygen saturation 95%.  HEENT:  Unremarkable.  NECK:  No bruits, no jugular venous distention.  HEART:  Regular rate and rhythm without murmur.  LUNGS:  Decreased breath sounds, although his lung sounds were clear.  ABDOMEN:  Slightly obese, soft, nontender.  EXTREMITIES:  Pulses intact  without edema.  The right groin is extremely  ecchymotic, and there is a hematoma noted.  NEUROLOGIC:  Grossly intact with 5/5 muscle strength throughout.   IMPRESSION:  1.  Recent left-sided weakness, consistent with multiple transient ischemic      attacks.  2.  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  3.  Recent anterior myocardial infarction on September 13, 2004, status post      Cypher stent to the left anterior descending.  4.  Ejection fraction of 20% to 30%.  5.  Question apical thrombus.  6.  History of diabetes.  7.  History of hyperlipidemia.  8.  History of hypertension.  9.  History of gastroesophageal reflux disease.  10. History of sternal fracture.  11. History of priapism in the past, questionably due to sickle cell trait.  12. History of tobacco use.  13. Coumadin therapy discontinued today.  14. Leukocytosis.   PLAN:  Dr. Corliss Skains and myself had a long discussion with the patient and  his wife today.  Dr. Corliss Skains recommended a cerebral angiogram prior to  deciding whether or not to proceed with possible stenting of the right  middle cerebral artery.  The risks and benefits were discussed with the  patient and his wife in detail.  It may be a situation where, following the  angiogram, a determination could be made that would be too risky to proceed  with attempts at stenting the vessel.  The patient and his wife are aware of  this possibility.  They other option would be to do nothing, in which case  the patient could go on to have a stroke, which may be mild or could be  devastating, including the possibility of death.  The risks of proceeding  with the angioplasty and stent could also include a major stroke, myocardial  infarction, or death.  The patient is aware of this possibility.  They do  agree to proceed.   The patient's Coumadin is currently on hold.  His INR would have to be 1.8 or less in  order to perform the angiogram with the consideration for a  possible stent.  The patient will probably need heparin therapy once his INR  is less than 2.  We  will continue to follow the patient and make  arrangements for the cerebral angiogram once his INR is within an  appropriate range.      DR/MEDQ  D:  09/26/2004  T:  09/26/2004  Job:  [id number]   cc:   Casimiro Needle L. Thad Ranger, M.D.  [address]  [address]  [place]  [place] [address]  Fax: [phone]   Dr. Sherryll Burger, Corrin Parker, M.D. [facility]

## 2010-11-17 NOTE — Consult Note (Signed)
NAME:  Edwin White NO.:  [id number]   MEDICAL RECORD NO.:  [id number]          PATIENT TYPE:  INP   LOCATION:  6533                         FACILITY:  [facility]   PHYSICIAN:  Marlan Palau, M.D.  DATE OF BIRTH:  03-09-1966   DATE OF CONSULTATION:  DATE OF DISCHARGE:                                   CONSULTATION   HISTORY OF PRESENT ILLNESS:  Edwin White is a 45 year old right-  handed white male born 12-03-1965, with a history of cerebrovascular  disease and coronary artery disease. This patient was seen by Dr. Thad Ranger  from our group in March of 2006 after he presented with an anterior wall MI.  The patient had an episode of transient left-sided weakness after return  home following a LAD stent placement. The patient was noted to have an  ejection fraction around 20% at that time possible with a neural thrombus.  The patient had been treated with Coumadin. The patient underwent a cerebral  angiogram that revealed an occluded right middle cerebral artery with  evidence of collateral flow through the lenticulare vessels. The patient  also had stenotic origin of the left posterior cerebral artery. The patient  was seen by Dr. Corliss Skains but was not felt to be a candidate for a stenting  procedure. The patient had been treated with Coumadin as an outpatient but  has had trouble getting medications due to lack of insurance coverage. The  patient has been off of Coumadin for several months and was supposed to be  on Plavix but it is not clear he was taking this properly. The patient comes  in with unstable angina and at this point is felt to have significant 3-  vessel coronary artery disease with restenosis of the LAD vessel. The  patient is being considered for a CABG procedure and neurology consultation  was obtained for neurologic clearance prior to surgery. This patient has not  had any recurring neurologic events since March of 2006. No reports of  headache, visual disturbance, numbness or weakness on the face, arms or  legs, speech disturbance, gait disturbance.   PAST MEDICAL HISTORY:  1.  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  Left posterior cerebrovascular artery stenosis.  1.  Unstable angina with 3-vessel coronary artery disease.  2.  Ischemic cardiomyopathy with an ejection fraction of 20-30%/  3.  History of anterior wall MI in March, 2006 status post LAD stent.  4.  History of hypertension.  5.  Hypercholesterolemia.  6.  Gastroesophageal reflux disease.  7.  Lumbosacral spine surgery.  8.  Diabetes.  9.  Tobacco abuse.  10. Medical noncompliance.   ALLERGIES:  No known drug allergies.   HABITS:  Continues to smoke a pack of cigarettes a day but claims he has cut  back from smoking three packs a day. The patient does not drink alcohol.   CURRENT MEDICATIONS:  1.  Aspirin 81 mg daily.  2.  Zebeta 5 mg daily.  3.  Glyburide 5 mg  daily.  4.  Sliding scale Insulin.  5.  Imdur 60 mg daily.  6.  Zocor 20 mg daily.  7.  Tylenol if needed.  8.  Xanax 0.25 mg b.i.d. p.r.n.  9.  Ambien 10 mg if needed.   SOCIAL HISTORY:  This patient lives in the [place] area. He  is married and has three children, one with cerebral palsy. Works as a [employer]  [employer].   FAMILY HISTORY:  Mother died of cancer. Father died with heart disease. The  patient has four brothers and sisters, some with heart disease, one with  stroke, one with seizures.   REVIEW OF SYSTEMS:  Notable for no recent fevers, chills. Patient denies  headache, vision changes, swallowing problems, neck pain. Denies shortness  of breath, has had some chest pain prior to admission. Denies abdominal  pain, nausea, vomiting, trouble with control of bowel or bladder. Denies any  black out episodes, numbness, weakness of face, arms or legs, no gait  disturbance.   PHYSICAL EXAMINATION:   VITAL SIGNS:  Blood pressure is 146/94, heart rate is  82, temperature afebrile, respiratory rate 18.  GENERAL:  This patient is a fairly well-developed white male who is alert  and cooperative at the time of examination.  HEENT:  Head is atraumatic. Eyes - pupils are round, react to light, disks  are flat bilaterally.  NECK:  Supple, no carotid bruits.  RESPIRATORY EXAMINATION:  Clear.  CARDIOVASCULAR EXAM:  Reveals distant heart sounds, no obvious murmurs, rubs  noted.  EXTREMITIES:  Without significant edema.  NEUROLOGIC EXAMINATION:  Cranial nerves as above. Facial symmetry is  present. The patient has good sensation of the face to pin prick, soft touch  bilaterally. He has good strength of facial muscles, shoulder shrug  bilaterally. Speech is well enunciated, not aphasic. Motor testing reveals  5/5 strength in all four extremities. Good symmetric motor tone, is 5/5.  Again, sensation is intact to pin prick, soft touch, vibratory sensation  throughout. The patient has good finger-to-nose to finger. Heel to shin.  Gait not tested. Deep tendon reflexes are symmetric, normal. Toes down going  bilaterally. No drift is seen.   LABORATORY DATA:  Notable for a white count of 7.5 thousand, hemoglobin of  13.5, hematocrit of 38.1, MCV of 91.1, platelets of 129,000. Sodium of 139,  potassium 3.3, chloride of 104, CO2 of 28, glucose 152, BUN 7, creatinine  0.9, calcium 8.6.  Urinalysis revealed specific gravity 1.028, pH was 6.5,  glucose of 500 mg/dL.   IMPRESSION:  1.  History of cerebrovascular disease.  2.  History of unstable angina.  3.  Three-vessel coronary artery disease.  4.  Ischemic cardiomyopathy.  5.  Diabetes.  6.  Hypertension.   This patient has multiple risk factors for cerebrovascular disease but at  this point has unstable angina. The patient has restenosis of the LAD vessel and is being considered for coronary artery bypass graft procedure. The  patient has had a  cerebral angiogram previously that showed an occluded  right middle cerebral artery but patient has already developed collateral  flow through the lenticulostriate vessels. The patient also has stenosis at  the origin of the left  posterior cerebral artery. The patient likely would be a candidate for the  CABG procedure from a neurologic stand-point. Will repeat the MRI angiogram  of intracranial vessels to reevaluate the stenosis. Will follow patient's  clinical course while in-house.      Frederico Hamman  Anne Hahn, M.D.  Electronically Signed     CKW/MEDQ  D:  10/04/2005  T:  10/05/2005  Job:  [id number]

## 2010-11-17 NOTE — Discharge Summary (Signed)
NAME:  Edwin White, Edwin White NO.:  [id number]   MEDICAL RECORD NO.:  [id number]          PATIENT TYPE:  INP   LOCATION:  3704                         FACILITY:  [facility]   PHYSICIAN:  Salvadore Farber, M.D. LHCDATE OF BIRTH:  02/16/1966   DATE OF ADMISSION:  09/22/2004  DATE OF DISCHARGE:  09/22/2004                                 DISCHARGE SUMMARY   DISCHARGING DIAGNOSIS:  Status post right groin pseudoaneurysm.   PROCEDURE THIS ADMISSION:  Thrombin injection of right groin pseudoaneurysm  by Dr. Randa Evens.   PAST MEDICAL HISTORY:  Status post cardiac catheterization September 20, 2004.   DISPOSITION:  Patient discharged home by Dr. Samule Ohm.  No discharge  instructions available at this time.  Evidently, patient was discharged home  with pain medications, however I have no record of prescription given to  patient available at this time or discharge instructions.   HOSPITAL COURSE:  Edwin White status post cardiac catheterization and  discharged home on September 20, 2004, who was noted to have a large hematoma  with his physical exam.  On September 22, 2004, patient was found to have  pseudoaneurysm by CT and ultrasound at [facility].  Patient  presented to [facility] for STAT PT, INR and a CBC.  Patient was given  Demerol 25 mg IV for pain control, patient placed on bedrest, for thrombin  injection into right hematoma.  Patient with a PT of 27.8 and INR of 3.8 on  September 22, 2004.  Hemoglobin 13.8 and hematocrit 39.  Patient had presented  to our [facility] office on a.m. of admission for INR check and he complained of  pain in right groin.  His INR at our [facility] office had been 7.5.  He was sent  to the hospital for re-evaluation, INR found to be 5.0 at that time.  The  patient had CT scan done showing a large right femoral pseudoaneurysm status  post successful thrombin injection by Dr. Samule Ohm.       MB/MEDQ  D:  12/11/2004  T:  12/11/2004  Job:  [id number]

## 2010-11-17 NOTE — Cardiovascular Report (Signed)
NAME:  Edwin White, Edwin White NO.:  [id number]   MEDICAL RECORD NO.:  [id number]          PATIENT TYPE:  INP   LOCATION:  2308                         FACILITY:  [facility]   PHYSICIAN:  Salvadore Farber, M.D. LHCDATE OF BIRTH:  [birth year]   DATE OF PROCEDURE:  09/13/2004  DATE OF DISCHARGE:                              CARDIAC CATHETERIZATION   PROCEDURE:  Left heart catheterization, left ventriculography, coronary  angiography, drug-eluting stent placement in the ostium of the left anterior  descending.   INDICATION:  Edwin White is a 45 year old gentleman with diabetes mellitus,  dyslipidemia, ongoing tobacco abuse, and hypertension.  He developed chest  discomfort approximately 3 a.m.  He did not seek medical attention until  approximately 11 a.m.  At that point, he was having no chest discomfort.  He  was then transferred urgently to [facility] to [facility] for further  evaluation.  Here, he was having no further chest discomfort.  However,  electrocardiogram suggested anterior myocardial infarction.  Given this, he  was referred for diagnostic angiography with an eye to percutaneous  revascularization.  We were aware that his lack of pain and more than 12  hours into his presentation suggested that the majority of the damage was  done.  However, given his young age, we elected to proceed aggressively in  the hope of salvage.  This was guided in part by the fact that the  electrocardiogram suggested a proximal LAD lesion, but he had Q waves only  in V1 and V2 with sparing distally, suggesting that he may have re-perfused  spontaneously or had some collateral support.   PROCEDURAL TECHNIQUE:  Informed consent was obtained.  Under 1% lidocaine  local anesthetic, a 6-French sheath was placed in the right common femoral  artery using a modified Seldinger technique.  Diagnostic angiography and  ventriculography were performed using JL4, JR4 and pigtail catheters.   These  images demonstrated 90% stenosis of the ostium of the LAD.  There was also  total occlusion of the distal circumflex supplying a relatively small PDA.  Consideration was made for bypass grafting versus percutaneous  revascularization.  Dr. Juanda Chance and I discussed it.  We both agreed that the  lesion could well be treated with percutaneous intervention and that given  its severity, it had a fairly high likelihood of reocclusion while he waited  for CABG.  We therefore decided to proceed urgently to percutaneously  revascularization.   Anticoagulation was initiated with additional bolus of eptifibatide to make  for a second bolus.  Six hundred milligrams of Plavix were administered  orally.  Additional heparin was given to achieve and maintain an ACT of  greater than 200 seconds.  A CLS 4 guide was advanced over a wire and  engaged in the ostium of the left main.  A Prowater wire was advanced to the  distal LAD without difficulty.  The lesion was predilated using a 3.0 x 15.0-  mm Maverick.  It was then stented using a 3.0 x 18.0-mm CYPHER deployed at  16 atmospheres.  The stent was then post-dilated using  a 3.5 x 15.0-mm  Quantum at 18 atmospheres for 2 sequential inflations.  Repeat angiography  demonstrated no residual stenosis, TIMI-3 flow to the distal vasculature and  no dissection.   Upon completion of the procedure, it was noted he had a soft hematoma in his  groin.  Manual compression was applied and then FemStop applied.   COMPLICATIONS:  Small groin hematoma.   FINDINGS:  1.  LV:  107/19/40.  EF approximately 20% with severe hypokinesis of the      entirety of the ventricle with the exception of the base.  2.  Left main:  Angiographically normal.  3.  LAD:  Large vessel giving rise to a single large diagonal.  There is an      ostial 90% stenosis.  This was treated to no residual using a drug-      eluting stent.  4.  Circumflex:  Large co-dominant vessel.  The second  obtuse marginal has      an 80% stenosis distally.  This vessel is fairly small.  The distal      circumflex has an approximately 15-mm total occlusion which appears      chronic.  The relatively small left PDA is well-collateralized from the      left system.  5.  RCA:  Small, co-dominant vessel which has diffuse luminal      irregularities, but no significant stenoses.   IMPRESSION/PLAN:  Successful drug-eluting stent placement in the culprit  lesion of the left anterior descending ostium.  He should be continued on  Plavix for at least a year.  Aspirin will be continued indefinitely.  ACE  inhibitor was initiated.  We will hold beta blocker initially, given his  acute heart failure.  He will be diuresed.  I hope to begin beta blocker  subsequently.  We will plan echocardiogram for the morning.  Smoking  cessation was strongly advised.      WED/MEDQ  D:  09/13/2004  T:  09/14/2004  Job:  [id number]

## 2010-11-17 NOTE — Discharge Summary (Signed)
NAME:  Edwin White, LEAMING NO.:  [id number]   MEDICAL RECORD NO.:  [id number]          PATIENT TYPE:  INP   LOCATION:  6533                         FACILITY:  [facility]   PHYSICIAN:  Arvilla Meres, M.D. LHCDATE OF BIRTH:  [birth year]   DATE OF ADMISSION:  10/03/2005  DATE OF DISCHARGE:  10/04/2005                                 DISCHARGE SUMMARY   PRIMARY CARDIOLOGIST:  Dr Simona Huh.   PRIMARY CARE PHYSICIAN:  Dr. Sherryll Burger in [place].   PRINCIPAL DIAGNOSIS:  Unstable angina.   OTHER DIAGNOSES:  1.  Coronary artery disease and ischemic cardiomyopathy, ejection fraction      45% to 50%.  2.  Hyperlipidemia.  3.  Hypertension.  4.  History of left ventricular apical thrombus.  5.  Type 2 diabetes mellitus.  6.  History of sternal fracture secondary to trauma in the 1980s.  7.  Tobacco abuse.   ALLERGIES:  NO KNOWN DRUG ALLERGIES.   PROCEDURES:  1.  Left heart cardiac catheterization.  2.  Two-dimensional echocardiogram.   HISTORY OF PRESENT ILLNESS:  [age]-old white male with prior  history of CAD, status post anterior wall MI, with placement drug-eluting  stents to the proximal LAD in March 2006.  He also has a history of ischemic  cardiomyopathy with a previously documented EF of 20% and LV apical thrombus  treated with chronic anticoagulation on Coumadin.  He presented to the [facility] ED on October 03, 2005 following a 2-week history of progressive  exertional chest pain.  Notably, he was admitted to [facility]  approximately 1 week prior to admission, where he rule out for MI and was  discharged home on Plavix therapy.  Unfortunately, he continued to have  exertional chest pain and had not been taking all of his medication  secondary to cost.  He also continues to smoke about 1 pack per day.  Decision was made to admit him for further evaluation.   HOSPITAL COURSE:  Following admission, Mr. Concannon ruled out for MI.  Given  his unstable  angina, a decision was made to pursue left heart cardiac  catheterization, which took place on April 4, revealing a 75% in-stent  restenosis within the proximal LAD previously placed stent as well as 75%  stenosis in the second obtuse marginal and total occlusion of the distal  left circumflex and a 75% stenosis in the distal RCA.  EF was 40% to 50%.  It was decided that given his multivessel disease, he would likely benefit  most from cardiovascular bypass surgery.  He was seen by Cardiothoracic  Surgery, who recommended neurologic evaluation prior to surgery secondary to  previously documented severe intracranial vascular disease noted on cerebral  angiogram approximately 1 year ago, at which time he had multiple TIAs.  Neurology was consulted and an MRA of the brain was performed.  Unfortunately, Mr. Tsou decided that he did not want to wait for  Neurology's recommendations based on MRA results and on the evening of April  5, decided to leave against medical advice.  He left without any  prescriptions  or discharge instructions.  Of note, subsequent evaluation and  MRA revealed persistent angiographic occlusion of the right middle cerebral  artery with suggestions of collaterals from the PCA as well as the  lenticulostriate vessels, compatible with.  It was otherwise a stable MR  angiogram.   DISCHARGE LABORATORY DATA:  Hemoglobin 13.5, hematocrit 38.1, WBC 7.5,  platelets 129,000.  Sodium 139, potassium 3.3, chloride 104, CO2 28, BUN 7,  creatinine 0.9, glucose 152, calcium 8.6, total bilirubin 0.4, alkaline  phosphatase 57, AST 17, ALT 19.  Hemoglobin A1c 9.5.  Cardiac markers were  negative x2.  Total cholesterol 187, triglycerides 214, HDL 32, LDL 112.  TSH 1.598.   DISPOSITION:  The patient left against medical advice.   DISCHARGE MEDICATIONS:  The patient was advised prior to leaving to continue  his home medications which included:  1.  Metformin 500 mg b.i.d.  2.   Lovastatin 10 mg nightly.  3.  Zantac 150 mg b.i.d.  4.  Nitroglycerin 0.4 mg sublingual p.r.n. chest pain.  5.  Glyburide 5 mg daily.  6.  Plavix 75 mg daily.  7.  Bisoprolol 2.5 mg daily.  8.  Isordil 20 mg daily.  9.  Aspirin 81 mg daily.   OUTSTANDING LABORATORY STUDIES:  None.   DURATION OF DISCHARGE ENCOUNTER:  Forty minutes.      Ok Anis, NP      Arvilla Meres, M.D. [facility]  Electronically Signed    CRB/MEDQ  D:  11/13/2005  T:  11/14/2005  Job:  [id number]

## 2010-11-17 NOTE — Consult Note (Signed)
NAME:  Edwin White, Edwin White                ACCOUNT NO.:  [id number]   MEDICAL RECORD NO.:  [id number]          PATIENT TYPE:  OBV   LOCATION:  3701                         FACILITY:  [facility]   PHYSICIAN:  Michael L. Reynolds, M.D.DATE OF BIRTH:  06/19/1966   DATE OF CONSULTATION:  09/25/2004  DATE OF DISCHARGE:                                   CONSULTATION   REFERRING PHYSICIAN:  Salvadore Farber, M.D.   CHIEF COMPLAINT:  left-sided weakness.   HISTORY OF PRESENT ILLNESS:  This is the initial inpatient consultation  evaluation of this 45 year old man with a past medical history which  includes coronary artery disease with a large anterior MI on September 13, 2004  resulting in ischemic cardiomyopathy, status post stent placement in the  left anterior descending artery. The patient was discharged from the  hospital on September 20, 2004. He states that at some point after his heart  attack he noticed that he was having intermittent spells involving the  left side and these have continued once he got home. He estimates that he  has about 15 of these and say they are sudden in onset and the left arm and  leg will suddenly get weak. If he is standing he might. If he is holding  something in the left hand it might drop. This last a few minutes and then  will return. This is not associated with any slurring of speech or visual  change. He denies ever having anything like this prior to his MI.   Because of these persistent symptoms, he was admitted for observation on  September 24, 2004. He came in with a MRI of the brain that day which did  demonstrate significant abnormalities. Subsequently, neurology consultation  was requested for management regarding this. The patient presently denies  any particular problems except for pain in the right groin related to a  hematoma which complicated his catheterization during his last  hospitalization.   PAST MEDICAL HISTORY:  Remarkable for coronary artery disease  as above. He  has been diagnosed with diabetes and hypertension, each for about the last  six years or so. He also has gastroesophageal reflux disease and significant  hyperlipidemia.   FAMILY HISTORY:  Dominated by early coronary artery disease in his father,  siblings.   SOCIAL HISTORY:  He lives in [place]  with his wife and children. He does have  a smoking history and now smokes a quarter a pack a day.   REVIEW OF SYMPTOMS:  Per the admission H&P.   MEDICATIONS:  Prior to admission, he was taking Coumadin, Lipitor,  Glucovance, Actos, baby aspirin, Plavix, Coreg, Lisinopril, Aldactone,  Aciphex, and a nicotine patch. These medications are continued in the  hospital.   PHYSICAL EXAMINATION:  VITAL SIGNS:  Temperature 98.6, blood pressure  112/74, pulse 74, respirations 18.  GENERAL:  This is a healthy-appearing man in no evident distress.  HEENT:  Normocephalic and atraumatic. Oropharynx benign.  NECK:  Supple without carotid bruits.  HEART:  Regular rate and rhythm without murmurs.  NEUROLOGICAL:  Mental status:  He is awake,  alert, and fully oriented.  Recent and remote memory are intact. Attention span, concentration, and fund  of knowledge are appropriate. Speech is fluent and not dysarthric. He has no  defects to naming. Mood is euthymic and affect appropriate. Cranial nerves:  Funduscopic exam is benign. Pupils are equal and reactive. Extraocular  movements full without nystagmus. Visual fields are full to confrontation.  Hearing is intact to conversational speech. Facial sensation is intact.  Face, tongue, and palate move normally and symmetrically. Motor normal bulk  and tone. Normal strength in all tested extremity muscles. Sensation intact  to light touch in all extremities. Coordination finger-to-nose is performed  adequately. He rises from chair easily. The stance is normal. He is able to  toe walk without much difficulty. Reflexes are 2+ and symmetric. Toes are   downgoing.   LABORATORY DATA:  A CBC shows white count 17.2, hemoglobin 13.6, platelets  240,000. Chemistries were unremarkable except for an elevated glucose of  131. INR on September 24, 2004 was 3.0. TSH and B12 are unremarkable. MRI of the  brain with MRA of the intracranial circulation performed yesterday is  personally reviewed. The study is remarkable for some white mater changes  which are read as acute and subacute but appear to be at least subacute in  age, adjacent to the superior surface of the right lateral ventricle. The  MRA is remarkable for marked stenosis of the right M1 segment of the middle  cerebral artery with considerable attenuation at the distal vessels.   IMPRESSION:  Transient spells of left-sided weakness by history. He has  normal examination at this time. 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   RECOMMENDATIONS:  We will check a carotid transcranial Doppler. He is  presently on pretty maximal medical therapy and failing it. Therefore, I  think it is reasonable for Dr. Grandville Silos. Deveshwar to evaluate him for  consideration of possible angioplasty of the right M1 segment. I spoke to he  and his wife and they are amenable to discussing this intervention and we  will make those arrangements in the morning. Stroke service to follow.      MLR/MEDQ  D:  09/25/2004  T:  09/25/2004  Job:  [id number]

## 2010-11-17 NOTE — H&P (Signed)
NAME:  Edwin White, Edwin White NO.:  [id number]   MEDICAL RECORD NO.:  [id number]          PATIENT TYPE:  EMS   LOCATION:  MAJO                         FACILITY:  [facility]   PHYSICIAN:  Arvilla Meres, M.D. LHCDATE OF BIRTH:  15-Mar-1966   DATE OF ADMISSION:  10/02/2005  DATE OF DISCHARGE:                                HISTORY & PHYSICAL   PRIMARY CARE PHYSICIAN:  Kirstie Peri, M.D.   CARDIOLOGIST:  Jonelle Sidle, M.D.   REASON FOR ADMISSION:  Progressive exertional angina.   PATIENT IDENTIFICATION/HISTORY OF PRESENT ILLNESS:  Edwin White is a 45-year-  old male with history of coronary artery disease, status post anterior  myocardial infarction with PTCA and drug-eluting stent to the ostial LAD in  March 2006.  This was complicated by significant left ventricular  dysfunction with an EF of 20%.  He was also found to have a possible apical  thrombus at the time of catheterization.  He also has a history of diabetes  and cerebrovascular disease.  He now presents to the emergency room with a  two-week history of exertional chest pain.  He says he was admitted last  week to [facility] where he ruled out for myocardial infarction and  was discharged home.  At that time he was noncompliant with many of his  medications including his Plavix.  He was restarted on his Plavix and  switched to many generic medications.  He said that over the past week he  has continued to have chest pain with any exertion at all.  This is relieved  with rest.  He has not had any nocturnal angina or resting angina.  He  denies any heart failure or symptoms.  He said he finally came to the  emergency room as he could no longer stand having the pain come off and on.  He admits to not being compliant with all his medications secondary to  finances.  In particular, he has not taken any Coumadin for three days.  Unfortunately, he is still smoking one pack of cigarettes a day.  In the ER  today, his EKG did not show any acute changes.  His point-of-care markers  are still pending.   REVIEW OF SYSTEMS:  He denies any heart failure.  There has been no  orthopnea, PND or lower extremity edema.  No claudication.  He denied any  neurologic symptoms.  He denies any melena, hematochezia, or other GI  bleeding.  He has not had fevers or chills.  There is no positional  component to his chest pain.  All other systems are negative except for HPI  and problem list.   PROBLEMS:  1.  Coronary artery disease.      1.  Anterior wall myocardial infarction in March 2006 with PCA and drug-          eluting stent to the ostial LAD.      2.  Post catheterization course was complicated by large right          pseudoaneurysm requiring thrombin injection.  2.  Ischemic cardiomyopathy with EF  of 20% by catheterization.  3.  Questionable apical thrombus by catheterization in March 2006.  4.  Hypertension.  5.  Hyperlipidemia.  6.  Medication noncompliance.  7.  Gastroesophageal reflux disease.  8.  History of sternal fracture.  9.  History of back surgery.  10. Diabetes.  11. History of TIA.      1.  MRI./MRA showed severe stenosis of the right middle cerebral artery.          This was evaluated by Dr. Corliss Skains and thought not to be amenable          to angioplasty.   CURRENT MEDICATIONS:  1.  Metformin 500 mg b.i.d.  2.  Lovastatin 10 mg a day.  3.  Zantac 150 mg daily.  4.  Nitroglycerin p.r.n.  5.  Glyburide 5 mg a day.  6.  Plavix 75 mg a day.  7.  Bisoprolol 2.5 mg a day.  8.  Isordil 20 mg a day.  9.  Aspirin 81 mg a day.   MEDICATIONS ALLERGIES:  No known drug allergies.   SOCIAL HISTORY:  He lives in [place] with his wife.  He continues to work as  a Naval architect. One pack of tobacco a day for many years.  Denies alcohol or  drugs.   FAMILY HISTORY:  His mother is alive in her 84's although he does not know  much about her.  Father died between the age of 92 and 5 of  coronary  disease.  He has two brothers with coronary disease in their 77's and one  sister with coronary disease in her 78's.   PHYSICAL EXAMINATION:  GENERAL:  He is lying flat in bed in no acute  distress.  VITAL SIGNS: Blood pressure 144/97, heart rate 75, afebrile, oxygen  saturation 99% on room air.  HEENT:  Sclerae anicteric.  EOMI.  No xanthelasma.  Mucous membranes are  moist.  NECK:  Supple.  There is no JVD.  Carotids are 2+ bilaterally and no bruits.  No lymphadenopathy or thyromegaly.  CARDIAC:  He has a regular rate and rhythm with distant heart sounds.  No  obvious murmurs, rubs or gallops.  LUNGS:  Clear to auscultation.  ABDOMEN:  Soft, nontender, nondistended.  No hepatosplenomegaly.  No bruits.  No masses.  Good bowel sounds.  EXTREMITIES:  Warm with no cyanosis, clubbing or edema.  Femoral pulses are  2+ bilaterally without bruits.  Dorsalis pedis pulses are 2+ bilaterally.  NEUROLOGIC:  He has a flat affect.  Cranial nerves II-XII intact.  He moves  all four extremities without difficulty.  He is alert and oriented x3.   LABORATORY DATA:  EKG shows normal sinus rhythm with left atrial  enlargement. There is evidence of a previous septal infarct.  No acute ST-T  wave changes.  Labs including point-of-care markers are pending.  Chest x-  ray is pending.   ASSESSMENT/PLAN:  1.  Chest pain.  This is concerning for unstable angina especially given his      medication noncompliance.  We will set him up for cardiac      catheterization once his INR is 1.6 or less.  In the interim we will      treat him with heparin, nitroglycerin, beta blocker, and statin.  2.  Ischemic cardiomyopathy with an EF of 20%.  Currently he does not have      any signs of symptoms of heart failure.  We will recheck an  echocardiogram to further evaluate his EF and the possibility of apical      thrombus. 3.  Tobacco use, ongoing.  He will need a smoking cessation consult.       Arvilla Meres, M.D. [facility]  Electronically Signed     DB/MEDQ  D:  10/03/2005  T:  10/03/2005  Job:  [id number] [id number]

## 2010-11-17 NOTE — H&P (Signed)
NAME:  Edwin White, Edwin White NO.:  [id number]   MEDICAL RECORD NO.:  [id number]          PATIENT TYPE:  INP   LOCATION:  3704                         FACILITY:  [facility]   PHYSICIAN:  Salvadore Farber, M.D. LHCDATE OF BIRTH:  04-05-1966   DATE OF ADMISSION:  09/22/2004  DATE OF DISCHARGE:  09/22/2004                                HISTORY & PHYSICAL   PRIMARY CARDIOLOGIST:  Jonelle Sidle, M.D. [facility]   PRIMARY CARE DOCTOR:  Kirstie Peri, M.D.   CHIEF COMPLAINT:  Right groin pain, status post cardiac catheterization.   A 45 year old Caucasian male who was recently discharged, on September 20, 2004,  status post acute MI with PCI.  He was D/C'd home on Coumadin 10 mg p.o.,  Plavix 75 mg p.o. every day, followed up at [facility] this a.m. for a PT  INR, finger stick for INR was found to be greater than 7.  The patient also  complained of pain in the right groin.  Further exam showed a large hematoma  to the right groin.  Per patient, the patient was sent to [facility]  for a venepuncture.  INR was found to be 5.0.  The patient states his groin  has been very discolored and painful since he was discharged home.   ALLERGIES:  No known drug allergies.   MEDICATIONS:  1.  Plavix 75.  2.  Coumadin 10.  3.  Lisinopril 20.  4.  Coreg 6.25 mg p.o. b.i.d.  5.  Aspirin 325.  6.  Glucovance 5/500 p.o. every day.  7.  Actos 30 daily.  8.  Aciphex 20 daily.  9.  Lipitor __________  .  10. Aldactone 25 daily.   PAST MEDICAL HISTORY:  1.  Hypertension.  2.  Type 2 diabetes.  3.  Status post acute MI.  4.  Status post cardiac catheterization with CYPHER stent to the ostial LAD      on September 13, 2004.  5.  GERD.  6.  History of sternal fracture secondary to trauma in 1980s.  7.  Left ventricular dysfunction with an EF of 20% by cath.   SOCIAL HISTORY:  The patient lives here in [place], [place] with his  wife.  He is a Curator.  He has three children.  He continues  to smoke one  pack a day.  Exercise:  Somewhat limited.  Denies any ETOH use.  Tries to  follow a low salt diabetic diet.  Denies any drug or herbal medication use.   FAMILY HISTORY:  No coronary artery disease on mother's side.  Father  deceased, age 23, MI CAD.  Siblings without CAD.   REVIEW OF SYSTEMS:  Positive for cough, positive for tremors on the left  side since cardiac catheterization, positive for pain in right groin with  discoloration.   PHYSICAL EXAMINATION:  VITAL SIGNS:  Temp 97.5, pulse 82, respirations 20,  blood pressure 113/76.  GENERAL:  He is alert and oriented.  No acute distress.  He complains of  pain in the right groin.  HEENT:  Pupils are equal,  round and reactive to light.  NECK:  Supple without lymphadenopathy.  Negative bruit.  Negative JVD.  CARDIOVASCULAR:  Heart regular rhythm.  S1 and S2.  Right groin positive  bruit.  LUNGS:  Clear to auscultation bilateral.  SKIN:  Right groin ecchymotic.  ABDOMEN:  Soft, nontender.  Positive bowel sounds.  EXTREMITIES:  No clubbing, cyanosis, or edema noted.  NEURO:  Alert and oriented x 3.  Cranial nerves II-XII grossly intact.   Chest x-ray:  Not available.  EKG:  Pending.  Lab work:  Pending.   Dr. Randa Evens in to see patient.  Lab work and scan from [facility] reviewed.  The patient is being prepped for a thrombin injection  right groin for pseudoaneurysm.  Further intervention and plan of care will  be decided by Dr. Samule Ohm after thrombin injection treatment.      MB/MEDQ  D:  09/22/2004  T:  09/23/2004  Job:  [id number]

## 2010-11-17 NOTE — Op Note (Signed)
NAME:  Edwin White, Edwin White NO.:  [id number]   MEDICAL RECORD NO.:  [id number]          PATIENT TYPE:  INP   LOCATION:  2306                         FACILITY:  [facility]   PHYSICIAN:  Evelene Croon, M.D.     DATE OF BIRTH:  06/05/1966   DATE OF PROCEDURE:  10/12/2005  DATE OF DISCHARGE:                                 OPERATIVE REPORT   PREOPERATIVE DIAGNOSIS:  Severe three vessel coronary artery disease.   POSTOPERATIVE DIAGNOSIS:  Severe three vessel coronary artery disease.   OPERATIVE PROCEDURE:  Median sternotomy, extracorporeal circulation,  coronary bypass graft surgery x3 using a left internal mammary artery graft  to left anterior descending coronary, with a saphenous vein graft to the  first obtuse marginal branch of the left circumflex coronary artery, and a  saphenous vein graft to the distal right coronary.  Endoscopic vein  harvesting from the right leg.   SURGEON:  Evelene Croon, M.D.   ASSISTANT:  Constance Holster, P.A.-C.   ANESTHESIA:  General endotracheal.   CLINICAL HISTORY:  This patient is a 45 year old gentleman with history of  coronary disease status post a large anterior MI with PTCA with a drug-  eluting stent to the ostium of the LAD in March 2006.  This was complicated  by significant left ventricular dysfunction with an ejection fraction of  about 20%.  There is a question of an apical thrombus at that time and he  has been treated with Coumadin.  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  He  presented to the emergency room on October 02, 2005, with a two week history of  exertional chest pain.  He was admitted to [facility] the week  before and myocardial infarction was ruled out and he was discharged home.  On his most recent admission, his electrocardiogram did not show any acute  changes.  He ruled out with negative enzymes.  He subsequently underwent  cardiac  catheterization on October 03, 2005, which showed severe three vessel  disease.  The left main was normal.  The LAD was a large vessel that coursed  around the apex.  The previously placed stent in the ostium of the LAD was  patent but there was about 75% stenosis at the proximal end of the stent at  the ostium.  There was also about 40% tubular stenosis within the stent.  There was small first and second diagonal branches that were diffusely  diseased.  The left circumflex was a large vessel that gave off a large  first marginal and a moderate size second marginal branch.  The first  marginal had about 40-50% tubular stenosis.  In the second marginal, there  was about 75% ostial stenosis and a 95% mid vessel lesion.  This was a  diffusely diseased vessel.  The left circumflex is totally occluded beyond  this in the AV groove.  There were bridging collaterals that fed the distal  left circumflex revealing two small posterolateral branches.  The right  coronary was a moderate  size vessel.  It gave off a large acute marginal  branch and terminated as a moderate sized posterior descending branch.  There was about 50% midvessel stenosis and 75% distal stenosis prior to the  posterior descending.  Left ventricular ejection fraction was estimated a 45-  50% with mild anterior hypokinesis.  There is no mitral regurgitation.  The  patient had an echocardiogram which was read as an ejection fraction of 20%  with anterior apical akinesis.  After review of the angiogram and  examination of the patient, it  was felt that coronary bypass graft surgery  was the best treatment.  I was concerned about his cerebrovascular disease  and his increased risk of stroke due to cerebral hypoperfusion.  His workup  the last year showed that he had a completely occluded right middle cerebral  artery and a high grade posterior cerebral artery stenosis.  He had  significant collaterals.  He had three TIAs last year with  symptoms of left  hemiparesis that completely resolved.  Neurology consultation was obtained  and Dr. Anne Hahn felt that we should repeat his MR angiogram to see if there  had been a significant change in his cerebrovascular disease.  This was  performed and showed no significant change from his scans one year ago.  Therefore, we decided to proceed with coronary bypass surgery since this was  the only reasonable treatment for his coronary disease and I thought he was  at significant risk of death due to an ostial LAD stenosis.  I discussed the  operative procedure of coronary bypass surgery with he and his wife.  I  discussed possible off pump bypass surgery if it could be done.  I discussed  alternatives, benefits, and risks including but not limited to bleeding,  blood transfusion, infection, stroke, myocardial infarction, graft failure,  and death.  I also discussed the importance of maximum cardiac risk factor  reduction including complete smoking cessation.  They understood and agreed  to proceed.   OPERATIVE PROCEDURE:  The patient was taken off the room and placed on the  table in supine position.  After induction of general endotracheal  anesthesia, a Foley catheter was placed in bladder using sterile technique.  Then, the chest, abdomen, and both lower extremities were prepped and draped  in the usual sterile manner.  The chest was entered through a median  sternotomy incision and the pericardium opened in the midline.  Examination  of the heart showed good right ventricular contractility.  The heart was  dilated.  The ascending aorta had no palpable plaques in it.  The left  internal mammary artery was harvested from the chest wall as a pedicle  graft.  This was a medium caliber vessel with excellent blood flow through  it.  At the same time, a segment of greater saphenous vein was harvested from the right leg using endoscopic vein harvest technique.  This vein was  of medium size  and good quality.   Then, the chest retractor was placed.  The heart was retracted towards the  right side to evaluate the obtuse marginal branch.  Whenever I began moving  the chart, the patient developed hypotension and atrial fibrillation with a  rapid ventricular response.  The first obtuse marginal branch was  intramyocardial.  This could not be visualized in any portion because it was  completely intramyocardial.  The second marginal branch was a small vessel  which was diffusely diseased and not felt to be suitable  for grafting.  These two vessels crossed over each other in close proximity on the  angiogram.  I also evaluated the distal right coronary artery which was  diffusely diseased.  There was one small area just before the posterior  descending branch which was suitable for grafting.  The LAD was also  diffusely diseased and thickened but graftable.  I did not feel this would  be suitable for off pump surgery given his hemodynamic instability and ST  changes on the monitor.  Therefore, preparations were made to place the  patient on cardiopulmonary bypass.  He was heparinized and when an adequate  activated clotting time was achieved, the distal ascending aorta was  cannulated using 20-French aortic cannula for arterial in flow.  Venous  outflow was achieved using a two-stage venous cannula for the right atrial  appendage.  An antegrade cardioplegia and vent cannula was inserted in the  aortic root.   The patient placed on cardiopulmonary bypass and distal coronaries  identified.  Then, the aorta was crossclamped and 500 mL of cold blood  antegrade cardioplegia was administered in the aortic root with quick arrest  the heart.  Systemic hypothermia to 28 degrees centigrade and topical  hypothermia with iced saline was used.  A temperature probe was placed in  the septum and an insulating pad in the pericardium.   The first distal anastomosis was performed to the first  obtuse marginal  branch.  This was located in its midportion within the muscle and was lying  fairly deep intramyocardial.  It was a moderate size vessel with an internal  diameter of about 1.75 mm.  The conduit used was a segment of greater  saphenous vein, the anastomosis performed in an end-to-side manner  continuous 7-0 Prolene suture.  Flow was measured through the graft and was  excellent.   The second distal anastomosis was performed to the distal right coronary.  The internal diameter was about 1.75 mm.  The conduit used was a second  segment of greater saphenous vein.  The anastomosis was performed in an end-  to-side manner using continuous 7-0 Prolene suture.  Flow was measured  through the graft and was excellent.  Then, another dose of cardioplegia was  given down the vein graft and the aortic root.   The third distal anastomosis was performed to the distal LAD.  The internal diameter was about 1.75 mm.  The conduit used was the left internal mammary  graft and this was brought through an opening in the left pericardium  anterior to the phrenic nerve.  It was anastomosed to the LAD in an end-to-  side manner using continuous 8-0 Prolene suture.  The pedicle was sutured to  the epicardium with 6-0 Prolene sutures.  Then, with the crossclamp in  place, the two proximal vein graft anastomoses were performed to the aortic  root in an end-to-side manner using continuous 6-0 Prolene suture.  Then,  the clamp was removed from mammary pedicle.  There was rapid rewarming of  the ventricular septum and return of spontaneous ventricular fibrillation.  The crossclamp was removed with a time of 71 minutes, and the patient was  rewarmed to 37 degrees centigrade.  The proximal and distal anastomoses  appeared hemostatic and the lie of the graft satisfactory.  Graft markers  were placed around the proximal anastomoses.  Two temporary right  ventricular and right atrial pacing wires were  placed and brought through  the skin.   When the  patient rewarmed to 37 degrees centigrade, he was weaned from  cardiopulmonary bypass on low-dose dopamine.  Total bypass time was 87  minutes.  Cardiac function appeared good with a cardiac output of 5 liters  per minute.  Protamine was given and the venous and aortic cannulae were  removed without difficulty.  Hemostasis was achieved.  Three chest tubes  were placed with two in the posterior pericardium, one in the left pleural  space, and one in the anterior mediastinum.  The pericardium was loosely  reapproximated over the heart.  The sternum was closed with #6 stainless  steel wires.  The fascia was closed with continuous #1 Vicryl suture.  The  subcutaneous tissues were closed with continuous 2-0 Vicryl and the skin  with 3-0 Vicryl subcuticular closure.  The lower extremity vein harvest site  was closed in layers in a similar manner.  Sponge, needle, and instrument  counts were correct according to the scrub nurse.  A dry sterile dressing  was applied over the incisions and around the chest tubes which were hooked  to Pleur-Evac suction.  The patient remained hemodynamically stable and was  transferred to the SICU in guarded but stable condition.      Evelene Croon, M.D.  Electronically Signed     BB/MEDQ  D:  10/12/2005  T:  10/12/2005  Job:  [id number]

## 2010-11-17 NOTE — Cardiovascular Report (Signed)
NAME:  Edwin, White NO.:  [id number]   MEDICAL RECORD NO.:  [id number]          PATIENT TYPE:  INP   LOCATION:  6533                         FACILITY:  [facility]   PHYSICIAN:  Arvilla Meres, M.D. LHCDATE OF BIRTH:  [birth year]   DATE OF PROCEDURE:  10/03/2005  DATE OF DISCHARGE:                              CARDIAC CATHETERIZATION   DATE OF PROCEDURE:  October 03, 2005.   PRIMARY CARE PHYSICIAN:  Dr. Kirstie Peri in [place].   CARDIOLOGIST:  Dr. Simona Huh.   PATIENT IDENTIFICATION:  [name] is a 45 year old male with multiple  medical problems including diabetes, hyperlipidemia and COPD with ongoing  tobacco use.  He suffered a large anterolateral myocardial infarction in  March 2006 and subsequently had a Cypher drug-eluting stent placed to the  ostial LAD.  Several months later he had recurrent chest pain and had a  balloon angioplasty of the diagonal.  Over the past several weeks he has had  a progressive/unstable angina and was admitted.  He ruled out for myocardial  infarction with serial cardiac markers and was brought to the  catheterization lab.   PROCEDURES PERFORMED:  1.  Selective coronary angiography.  2.  Left heart cath.  3.  Left ventriculogram.  4.  Left subclavian angiography.  5.  Right heart cath.   DESCRIPTION OF PROCEDURE:  A 6-French arterial sheath was placed in right  femoral artery using a modified Seldinger technique.  Standard catheters  including JL-4, JR-4 and angled pigtail were used for the catheterization.  All catheters are exchanged over wire.  There is no apparent complications.   A 7-French sheath was placed in the right femoral vein using a modified  Seldinger technique and a standard Swan-Ganz catheter was used for the right  heart cath.   FINDINGS:  Central aortic pressure 154/113 with a mean of 132, LV 146/16  with EDP of 27.  RA was mean of 12, RV 50/11, PA 47/25 with a mean of 36,  wedge pressure mean of 26.   Fick cardiac output was 4.6 L/min.  Fick cardiac  index 2 L/min.  Pulmonary vascular resistance was 2.2 Woods units.   Left main was short, angiographically normal.   LAD was a long vessel coursing to the apex.  It gave off two small diagonals  followed by a moderate-sized diagonal.  There was previously placed stent in  the ostial portion of LAD.  At the ostium and LAD there was a 75% stenosis  including extending into the ostium of the stent.  Following this there was  a 40% tubular stenosis.  Both the first and second diagonals are small and  diffusely diseased.   Left circ was a large vessel, gave off a large OM-1, a moderate-sized OM-2  and then left circumflex was totally occluded in the distal AV groove.  There were bridging collaterals which fed the distal left circ revealing two  small posterolateral's.  In the first OM there is a 40% tubular lesion, in  the ostium of the second OM there is a 75% ostial lesion and a 95%  lesion in  the midsection.   Right coronary artery was a moderate-sized dominant vessel.  It gave off an  RV branch and a large acute marginal and terminated in a moderate-sized PDA.  There is a 50% lesion in the midsection and a 75% lesion distally prior to  the PDA.  Left ventriculogram done in the RAO position showed an EF of 45-  50% with mild anterior hypokinesis.  There is no mitral regurgitation.   ASSESSMENT:  1.  Three-vessel coronary artery disease with in-stent restenosis of the      ostial left anterior descending.  2.  Mild left ventricular dysfunction with increased filling pressures.  3.  Severe noncompliance with medications including Plavix.  4.  Diabetes.  5.  Ongoing tobacco use.   PLAN/DISCUSSION:  Given his high risk of in-stent restenosis with his  noncompliance and diabetes, I think at this point although angioplasty of  the ostial LAD and distal right circumflex are possible,  I think his best  option is probably bypass surgery.  I  reviewed this with Dr. Juanda Chance who  agrees.  We will consult CVTS.      Arvilla Meres, M.D. [facility]  Electronically Signed     DB/MEDQ  D:  10/03/2005  T:  10/03/2005  Job:  [id number]

## 2010-12-14 ENCOUNTER — Encounter: Payer: Self-pay | Admitting: Cardiology

## 2011-01-23 ENCOUNTER — Encounter: Payer: Self-pay | Admitting: Cardiology

## 2011-01-23 ENCOUNTER — Ambulatory Visit (INDEPENDENT_AMBULATORY_CARE_PROVIDER_SITE_OTHER): Payer: Medicaid Other | Admitting: Cardiology

## 2011-01-23 VITALS — BP 150/85 | HR 68 | Resp 17 | Wt 249.4 lb

## 2011-01-23 DIAGNOSIS — I7389 Other specified peripheral vascular diseases: Secondary | ICD-10-CM

## 2011-01-23 DIAGNOSIS — I10 Essential (primary) hypertension: Secondary | ICD-10-CM

## 2011-01-23 DIAGNOSIS — I509 Heart failure, unspecified: Secondary | ICD-10-CM

## 2011-01-23 DIAGNOSIS — I2510 Atherosclerotic heart disease of native coronary artery without angina pectoris: Secondary | ICD-10-CM

## 2011-01-23 DIAGNOSIS — Z951 Presence of aortocoronary bypass graft: Secondary | ICD-10-CM

## 2011-01-23 DIAGNOSIS — I5022 Chronic systolic (congestive) heart failure: Secondary | ICD-10-CM

## 2011-01-23 MED ORDER — CARVEDILOL 12.5 MG PO TABS: 12.5000 mg | ORAL_TABLET | Freq: Two times a day (BID) | ORAL | Status: DC

## 2011-01-23 MED ORDER — ASPIRIN EC 81 MG PO TBEC: 81.0000 mg | DELAYED_RELEASE_TABLET | Freq: Every day | ORAL | Status: AC

## 2011-01-23 NOTE — Assessment & Plan Note (Signed)
The patient likely has peripheral polyneuropathy from diabetes versus pseudo-claudication and he has an appointment scheduled with neurology. ABIs have been done last year which showed no significant peripheral vascular disease.

## 2011-01-23 NOTE — Assessment & Plan Note (Signed)
No recurrent chest pain. However the patient bypass surgery 2007 and he may need in the nuclear perfusion study prior to consideration of ICD implantation.

## 2011-01-23 NOTE — Progress Notes (Signed)
HPI The patient has a history of nonischemic cardiomyopathy with prior coronary bypass grafting. Bypass surgery was performed in 2007. Initially his ejection fraction was 15-20%. Echocardiogram from 2009 showed an ejection fraction of 35%. Clinically the patient has had no recent hospitalizations for heart failure. He denies any chest pain or shortness of breath at rest. His in NYHA class II. He has no orthopnea or PND. I performed a bedside echocardiogram, but the study was technically very difficult but overall it appears that his ejection fraction is less than 35% multiple segmental wall motion abnormalities particularly apically, anteroseptal and septal. I told the patient that we will order formal echocardiogram because I suspect his ejection fraction falls in the range where we need to consider an ICD. The patient also had not had stress testing since 2007 as certainly this may need to be a consideration prior to the referral. The patient has been evaluated for peripheral vascular disease with ABIs last year which were within normal limits. He continues to have pain in his lower extremities but mainly when sitting. His symptoms are not consistent with claudication but rather consistent with diabetic neuropathy or pseudo-claudication particularly as it worsened when he sitting. His blood pressure somewhat poorly controlled particularly in light of his ischemic cardiomyopathy. He denies any presyncope or syncope he reports no palpitations. I discussed with the patient the need to proceed with a formal echocardiogram and a referral to the EP service or consideration of ICD implant. Of note is that the patient still smokes a pack a day but he is very compliant with his medical therapy despite some financial difficulties he has with co-pay  Allergies  Allergen Reactions  . Pravastatin Other (See Comments)    PT CANNOT NOT WALK OR MOVE.    Current Outpatient Prescriptions on File Prior to Visit    Medication Sig Dispense Refill  . clopidogrel (PLAVIX) 75 MG tablet Take 75 mg by mouth daily.        . furosemide (LASIX) 80 MG tablet Take 80 mg by mouth daily.        Marland Kitchen glyBURIDE (DIABETA) 5 MG tablet Take 5 mg by mouth. Take 1-2 times per day       . insulin glargine (LANTUS SOLOSTAR) 100 UNIT/ML injection Inject into the skin. As needed as directed       . lisinopril (PRINIVIL,ZESTRIL) 20 MG tablet Take 20 mg by mouth daily.        Marland Kitchen NIFEdipine (PROCARDIA-XL) 30 MG (OSM) 24 hr tablet Take 30 mg by mouth daily.        . potassium chloride SA (K-DUR,KLOR-CON) 20 MEQ tablet Take 20 mEq by mouth daily.          Past Medical History  Diagnosis Date  . CHF (congestive heart failure)   . Ischemic cardiomyopathy   . Coronary artery disease   . History of medication noncompliance   . Tobacco user   . Moderate mitral regurgitation by prior echocardiogram   . Type 2 diabetes mellitus   . Post-infarction apical thrombus   . Crescendo transient ischemic attacks   . Hyperlipidemia   . Heart attack September 13, 2004    Past Surgical History  Procedure Date  . Coronary artery bypass graft   . Cardiac catheterization     Family History  Problem Relation Age of Onset  . Coronary artery disease Father     History   Social History  . Marital Status: Legally Separated    Spouse  Name: N/A    Number of Children: N/A  . Years of Education: N/A   Occupational History  . Not on file.   Social History Main Topics  . Smoking status: Smoker, Current Status Unknown  . Smokeless tobacco: Not on file  . Alcohol Use:   . Drug Use:   . Sexually Active:    Other Topics Concern  . Not on file   Social History Narrative   Disabled.     WUJ:WJXBJYNWG positives as outlined above. The remainder of the 18  point review of systems is negative  PHYSICAL EXAM BP 150/85  Pulse 68  Resp 17  Wt 249 lb 6.4 oz (113.127 kg)  SpO2 97%   General: Well-developed, well-nourished in no  distress Head: Normocephalic and atraumatic Eyes:PERRLA/EOMI intact, conjunctiva and lids normal Ears: No deformity or lesions Mouth:normal dentition, normal posterior pharynx Neck: Supple, no JVD.  No masses, thyromegaly or abnormal cervical nodes Lungs: Normal breath sounds bilaterally without wheezing.  Normal percussion Chest: Status post sternotomy scar Cardiac: regular rate and rhythm with normal S1 and S2, no S3 or S4.  PMI is normal.  No pathological murmurs Abdomen: Normal bowel sounds, abdomen is soft and nontender without masses, organomegaly or hernias noted.  No hepatosplenomegaly MSK: Back normal, normal gait muscle strength and tone normal Vascular: Pulse is normal in all 4 extremities Extremities: No peripheral pitting edema Neurologic: Alert and oriented x 3 Skin: Intact without lesions or rashes Lymphatics: No significant adenopathy Psychologic: Normal affect  ECG: Normal sinus rhythm with anteroseptal infarct pattern old or age undetermined. No acute ischemic changes  Limited bedside echocardiogram: Ejection fraction likely less than 30%. Study was very difficult with poor windows. However there appear to be multiple segmental wall motion abnormalities in particular there is thinning of the apex, septum and anteroseptal wall as outlined above. There appears to be significant scar tissue.  ASSESSMENT AND PLAN

## 2011-01-23 NOTE — Patient Instructions (Signed)
Follow up as scheduled. Decrease Aspirin to 81 mg daily. Stop Bisoprolol/HCTZ and start Coreg (carvedilol) 12.5 mg. Take 1/2 tablet two times a day for 1 week and then increase to 1 tablet two times a day.  Your physician has requested that you have an echocardiogram. Echocardiography is a painless test that uses sound waves to create images of your heart. It provides your doctor with information about the size and shape of your heart and how well your heart's chambers and valves are working. This procedure takes approximately one hour. There are no restrictions for this procedure. Referral to Dr. Johney Frame in [place]. See appt above.

## 2011-01-23 NOTE — Assessment & Plan Note (Signed)
The patient is at increased risk for sudden death given his history of ischemic cardiomyopathy, LV dysfunction as well as significant scar tissue identified by limited echocardiogram today. The patient will be referred to the EP service as outlined above further ischemia evaluation may be in order after the results of the formal echocardiogram are available.

## 2011-01-23 NOTE — Assessment & Plan Note (Signed)
Blood pressure is poorly controlled and therefore I have changed him to carvedilol and further increaset his dose from perspective of his LV dysfunction but also from perspective of hypertension.

## 2011-01-23 NOTE — Assessment & Plan Note (Signed)
Significant LV dysfunction despite prior revascularization therapy. The patient is currently NYHA class II. he denies any chest pain or shortness of breath at rest. We will change his heart failure regimen and switch him to carvedilol initially 6.25 mg by mouth twice a day and then increase to 12.5 mg twice a day. He will also be scheduled for formal echocardiogram to properly assess wall motion abnormality and LV function. Aspirin can be decreased to 81 mg a day and Plavix continued.

## 2011-01-25 ENCOUNTER — Other Ambulatory Visit: Payer: Self-pay | Admitting: Cardiology

## 2011-01-25 ENCOUNTER — Other Ambulatory Visit (INDEPENDENT_AMBULATORY_CARE_PROVIDER_SITE_OTHER): Payer: Medicaid Other | Admitting: *Deleted

## 2011-01-25 DIAGNOSIS — I509 Heart failure, unspecified: Secondary | ICD-10-CM

## 2011-01-25 DIAGNOSIS — I2510 Atherosclerotic heart disease of native coronary artery without angina pectoris: Secondary | ICD-10-CM

## 2011-01-25 DIAGNOSIS — I5022 Chronic systolic (congestive) heart failure: Secondary | ICD-10-CM

## 2011-01-31 DIAGNOSIS — 419620001 Death: Secondary | SNOMED CT

## 2011-01-31 DEATH — deceased

## 2011-02-16 ENCOUNTER — Encounter: Payer: Self-pay | Admitting: *Deleted

## 2011-02-22 ENCOUNTER — Ambulatory Visit: Payer: Medicaid Other | Admitting: Internal Medicine

## 2011-04-25 ENCOUNTER — Ambulatory Visit: Payer: Medicaid Other | Admitting: Cardiology

## 2011-06-15 ENCOUNTER — Ambulatory Visit: Payer: Medicaid Other | Admitting: Cardiology

## 2011-08-02 ENCOUNTER — Telehealth: Payer: Self-pay | Admitting: *Deleted

## 2011-08-02 NOTE — Telephone Encounter (Signed)
Joana Reamer (fiance) left message on voice mail that they needed letter from [employer] stating he was able to take care of 2 small children, 7 & 48 year old.    Returned call - informed fiance he was last seen July 2012.  He would need to be seen before this could be done.  Stated he needed this note to take to lawyer for court.  Patient has OV scheduled Monday, 2/4 at 10:15.  States he has gastric test at [facility] that morning at 7:00 am.  Advised her to let us know if not done in time for appointment.  She verbalized understanding.

## 2011-08-06 ENCOUNTER — Ambulatory Visit (INDEPENDENT_AMBULATORY_CARE_PROVIDER_SITE_OTHER): Payer: Medicaid Other | Admitting: Cardiology

## 2011-08-06 ENCOUNTER — Encounter: Payer: Self-pay | Admitting: Cardiology

## 2011-08-06 VITALS — BP 162/107 | HR 77 | Ht 72.0 in | Wt 252.0 lb

## 2011-08-06 DIAGNOSIS — I509 Heart failure, unspecified: Secondary | ICD-10-CM

## 2011-08-06 DIAGNOSIS — I10 Essential (primary) hypertension: Secondary | ICD-10-CM | POA: Insufficient documentation

## 2011-08-06 DIAGNOSIS — I2510 Atherosclerotic heart disease of native coronary artery without angina pectoris: Secondary | ICD-10-CM

## 2011-08-06 DIAGNOSIS — K599 Functional intestinal disorder, unspecified: Secondary | ICD-10-CM | POA: Insufficient documentation

## 2011-08-06 DIAGNOSIS — I255 Ischemic cardiomyopathy: Secondary | ICD-10-CM | POA: Insufficient documentation

## 2011-08-06 DIAGNOSIS — K3184 Gastroparesis: Secondary | ICD-10-CM

## 2011-08-06 DIAGNOSIS — I2589 Other forms of chronic ischemic heart disease: Secondary | ICD-10-CM

## 2011-08-06 MED ORDER — HYOSCYAMINE SULFATE 0.125 MG PO TABS: 0.1250 mg | ORAL_TABLET | Freq: Four times a day (QID) | ORAL | Status: DC | PRN

## 2011-08-06 MED ORDER — CARVEDILOL 25 MG PO TABS: 25.0000 mg | ORAL_TABLET | Freq: Two times a day (BID) | ORAL | Status: DC

## 2011-08-06 NOTE — Patient Instructions (Signed)
   Increase Coreg to 25mg  twice a day    Hyoscyamine 0.125mg  every 6 hours as needed for abdominal pain Your physician wants you to follow up in: 6 months.  You will receive a reminder letter in the mail one-two months in advance.  If you don't receive a letter, please call our office to schedule the follow up appointment

## 2011-08-06 NOTE — Progress Notes (Signed)
Edwin Bottoms, MD, [facility] ABIM Board Certified in Adult Cardiovascular Medicine,Internal Medicine and Critical Care Medicine    CC: Followup patient ischemic cardiomyopathy  HPI:  The patient is a 46 year old male with ischemic cardiomyopathy status post prior bypass grafting. The patient has had improvement of his ejection fraction but his last echocardiogram obtained July 2012. His ejection fraction was 40-45% with no evidence of LV thrombus. There were regional wall motion abnormalities but no significant valvular heart disease. From a cardiac standpoint the patient is stable. This morning he had a gastric emptying study done because of recurrent and frequent bloating. The patient also severe constipation requiring both lubiprostone and MiraLAX. He complains of symptoms of functional dyspepsia as well as lower abdominal pain and cramps which is associated with symptoms of anxiety and mood changes. The patient is hypertensive this morning but has not taking his medications yet. He also reports that his diabetes has been poorly controlled. Is requesting a letter today that he stable from a cardiac perspective to take care of his 2 children are currently in foster care. Of note is that the patient drinks 3 pots of coffee a day  PMH: reviewed and listed in Problem List in Electronic Records (and see below) Past Medical History  Diagnosis Date  . CHF (congestive heart failure)     No recurrent admissions for heart failure.  . Ischemic cardiomyopathy     Ejection fraction improved from 15%-20% to 40%-45% in 2012 by echocardiogram  . Coronary artery disease     Status post coronary bypass grafting in 2007  . History of medication noncompliance   . Tobacco user   . Moderate mitral regurgitation by prior echocardiogram   . Type 2 diabetes mellitus   . Post-infarction apical thrombus     Anticoagulation discontinued  . Crescendo transient ischemic attacks   . Hyperlipidemia   . Heart  attack September 13, 2004    Followed by bypass surgery  . Gastroparesis     Gastric emptying study scheduled  . Functional bowel disorder     Bloating and abdominal cramping as well as constipation requiring lubiprostone  . Hypertension     Poorly controlled   Past Surgical History  Procedure Date  . Coronary artery bypass graft   . Cardiac catheterization     Allergies/SH/FHX : available in Electronic Records for review  Allergies  Allergen Reactions  . Pravastatin Other (See Comments)    PT CANNOT NOT WALK OR MOVE.   History   Social History  . Marital Status: Legally Separated    Spouse Name: N/A    Number of Children: N/A  . Years of Education: N/A   Occupational History  . Not on file.   Social History Main Topics  . Smoking status: Current Everyday Smoker -- 0.5 packs/day for 34 years    Types: Cigarettes  . Smokeless tobacco: Never Used  . Alcohol Use: Not on file  . Drug Use: Not on file  . Sexually Active: Not on file   Other Topics Concern  . Not on file   Social History Narrative   Disabled.    Family History  Problem Relation Age of Onset  . Coronary artery disease Father     Medications: Current Outpatient Prescriptions  Medication Sig Dispense Refill  . aspirin EC 81 MG tablet Take 1 tablet (81 mg total) by mouth daily.  150 tablet  2  . carvedilol (COREG) 25 MG tablet Take 1 tablet (25 mg total)  by mouth 2 (two) times daily.  60 tablet  6  . clopidogrel (PLAVIX) 75 MG tablet Take 75 mg by mouth daily.        . fish oil-omega-3 fatty acids 1000 MG capsule Take 1 g by mouth 2 (two) times daily.       . furosemide (LASIX) 80 MG tablet Take 80 mg by mouth daily.        Marland Kitchen gabapentin (NEURONTIN) 400 MG capsule Take 400 mg by mouth 2 (two) times daily.      Marland Kitchen glyBURIDE (DIABETA) 5 MG tablet Take 5 mg by mouth 2 (two) times daily with a meal.       . insulin glargine (LANTUS SOLOSTAR) 100 UNIT/ML injection Inject into the skin as directed.       Marland Kitchen  lisinopril (PRINIVIL,ZESTRIL) 20 MG tablet Take 20 mg by mouth daily.        Marland Kitchen lubiprostone (AMITIZA) 24 MCG capsule Take 24 mcg by mouth 2 (two) times daily with a meal.      . NIFEdipine (PROCARDIA-XL) 30 MG (OSM) 24 hr tablet Take 30 mg by mouth daily.        Marland Kitchen omeprazole (PRILOSEC) 40 MG capsule Take 40 mg by mouth daily.      . polyethylene glycol (MIRALAX / GLYCOLAX) packet Take 17 g by mouth daily.        . potassium chloride SA (K-DUR,KLOR-CON) 20 MEQ tablet Take 20 mEq by mouth daily.        . hyoscyamine (LEVSIN, ANASPAZ) 0.125 MG tablet Take 1 tablet (0.125 mg total) by mouth every 6 (six) hours as needed for cramping. Or abdominal pain.  30 tablet  1    ROS: No nausea or vomiting. No fever or chills.No melena or hematochezia.No bleeding.No claudication. Bloating and abdominal cramps. Postprandial fullness  Physical Exam: BP 162/107  Pulse 77  Ht 6' (1.829 m)  Wt 252 lb (114.306 kg)  BMI 34.18 kg/m2  SpO2 96% General: Well-nourished white male, in no distress Neck: Normal carotid upstroke and no carotid bruits. No thyromegaly nonnodular thyroid. JVP 6 cm Lungs: Clear breath sounds bilaterally without wheezing Cardiac: Regular rate and rhythm with normal S1-S2, no pathological murmurs Vascular: No edema. Distal pulses are difficult to palpate Skin: Warm and dry Physcologic: Depressed affect  12lead ECG: Not available Limited bedside ECHO:N/A   Patient Active Problem List  Diagnoses  . DIAB W/O MENTION COMP TYPE II/UNS TYPE UNCNTRL  . PURE HYPERCHOLESTEROLEMIA  . NONDEPENDENT TOBACCO USE DISORDER  . OLD MYOCARDIAL INFARCTION  . CHRONIC SYSTOLIC HEART FAILURE  . PVD WITH CLAUDICATION  . CHRONIC AIRWAY OBSTRUCTION NEC  . ESOPHAGEAL REFLUX  . PERS HX NONCOMPLIANCE W/MED TX PRS HAZARDS HLTH  . POSTSURGICAL AORTOCORONARY BYPASS STATUS  . Functional bowel disorder  . Gastroparesis-gastric emptying study pending   . Ischemic cardiomyopathy-ejection fraction improved to  40-45%   . Coronary artery disease-status post coronary bypass grafting without recurrent chest pain   . Hypertension-poorly controlled     PLAN   From a cardiac standpoint the patient is doing well. He has had no admissions for recurrent heart failure he has had significant improvement in his LV function  Blood pressure has been poorly controlled but the patient did not take his medications. However, we will further increase his carvedilol to 25 mg by mouth twice a day  The patient is waiting for the results of the gastric emptying study to rule out gastroparesis. I told him to consider  taking domperidone he has poor gastric emptying. I gave him information how to obtain approval from the FDA for import as his drug is not available in the [place].  In addition the patient also appears to have significant symptoms of functional bowel disorder and could benefit from a FODMAP diet. I gave the patient a pamphlet and instructions.  He also drinks a large amount of coffee which is contributing to his symptoms. I asked him to discontinue this practice.  I also gave the patient a prescription of Hyociamine to use sublingual.  I have also written a letter for cord that the patient is stable from a cardiovascular perspective to daycare obvious children that are currently in foster care.

## 2011-08-08 ENCOUNTER — Ambulatory Visit: Payer: Medicaid Other | Admitting: Cardiology

## 2011-10-08 ENCOUNTER — Other Ambulatory Visit: Payer: Self-pay | Admitting: *Deleted

## 2011-10-08 MED ORDER — HYOSCYAMINE SULFATE 0.125 MG SL SUBL: 0.1250 mg | SUBLINGUAL_TABLET | Freq: Four times a day (QID) | SUBLINGUAL | Status: DC | PRN

## 2011-12-05 ENCOUNTER — Other Ambulatory Visit: Payer: Self-pay | Admitting: *Deleted

## 2011-12-05 MED ORDER — HYOSCYAMINE SULFATE 0.125 MG PO TABS: 0.1250 mg | ORAL_TABLET | Freq: Four times a day (QID) | ORAL | Status: DC | PRN

## 2012-03-13 ENCOUNTER — Telehealth: Payer: Self-pay | Admitting: Cardiology

## 2012-03-13 NOTE — Telephone Encounter (Signed)
Received return mail.  Updated address from [address] to [address].  Called patient since appt was due.  Advised of letter and Dr. Andee Lineman no longer with practice.  Offered to schedule appt.  Patient said at this time he did not have transportation to office, will need to wait.  Advised I would send reminder in month.  Patient ok, will call if needs to be seen.

## 2012-04-01 DIAGNOSIS — I639 Cerebral infarction, unspecified: Secondary | ICD-10-CM

## 2012-04-01 HISTORY — DX: Cerebral infarction, unspecified: I63.9

## 2012-04-19 ENCOUNTER — Emergency Department (HOSPITAL_COMMUNITY): Payer: Medicaid Other

## 2012-04-19 ENCOUNTER — Inpatient Hospital Stay (HOSPITAL_COMMUNITY)
Admission: EM | Admit: 2012-04-19 | Discharge: 2012-04-23 | DRG: 069 | Disposition: A | Discharge status: Home / Self Care | Payer: Medicaid Other | Attending: Internal Medicine | Admitting: Internal Medicine

## 2012-04-19 ENCOUNTER — Encounter (HOSPITAL_COMMUNITY): Payer: Self-pay | Admitting: Emergency Medicine

## 2012-04-19 DIAGNOSIS — I255 Ischemic cardiomyopathy: Secondary | ICD-10-CM

## 2012-04-19 DIAGNOSIS — IMO0001 Reserved for inherently not codable concepts without codable children: Secondary | ICD-10-CM | POA: Diagnosis present

## 2012-04-19 DIAGNOSIS — I2589 Other forms of chronic ischemic heart disease: Secondary | ICD-10-CM | POA: Diagnosis present

## 2012-04-19 DIAGNOSIS — J4489 Other specified chronic obstructive pulmonary disease: Secondary | ICD-10-CM | POA: Diagnosis present

## 2012-04-19 DIAGNOSIS — Z91199 Patient's noncompliance with other medical treatment and regimen due to unspecified reason: Secondary | ICD-10-CM

## 2012-04-19 DIAGNOSIS — Z951 Presence of aortocoronary bypass graft: Secondary | ICD-10-CM

## 2012-04-19 DIAGNOSIS — E1149 Type 2 diabetes mellitus with other diabetic neurological complication: Secondary | ICD-10-CM | POA: Diagnosis present

## 2012-04-19 DIAGNOSIS — I2510 Atherosclerotic heart disease of native coronary artery without angina pectoris: Secondary | ICD-10-CM | POA: Diagnosis present

## 2012-04-19 DIAGNOSIS — I7389 Other specified peripheral vascular diseases: Secondary | ICD-10-CM

## 2012-04-19 DIAGNOSIS — K3184 Gastroparesis: Secondary | ICD-10-CM | POA: Diagnosis present

## 2012-04-19 DIAGNOSIS — I5022 Chronic systolic (congestive) heart failure: Secondary | ICD-10-CM | POA: Diagnosis present

## 2012-04-19 DIAGNOSIS — E7800 Pure hypercholesterolemia, unspecified: Secondary | ICD-10-CM

## 2012-04-19 DIAGNOSIS — K599 Functional intestinal disorder, unspecified: Secondary | ICD-10-CM

## 2012-04-19 DIAGNOSIS — F17200 Nicotine dependence, unspecified, uncomplicated: Secondary | ICD-10-CM | POA: Diagnosis present

## 2012-04-19 DIAGNOSIS — E785 Hyperlipidemia, unspecified: Secondary | ICD-10-CM | POA: Diagnosis present

## 2012-04-19 DIAGNOSIS — E876 Hypokalemia: Secondary | ICD-10-CM | POA: Diagnosis not present

## 2012-04-19 DIAGNOSIS — Z9119 Patient's noncompliance with other medical treatment and regimen: Secondary | ICD-10-CM

## 2012-04-19 DIAGNOSIS — I10 Essential (primary) hypertension: Secondary | ICD-10-CM | POA: Diagnosis present

## 2012-04-19 DIAGNOSIS — R4701 Aphasia: Secondary | ICD-10-CM | POA: Diagnosis present

## 2012-04-19 DIAGNOSIS — G459 Transient cerebral ischemic attack, unspecified: Principal | ICD-10-CM | POA: Diagnosis present

## 2012-04-19 DIAGNOSIS — I509 Heart failure, unspecified: Secondary | ICD-10-CM | POA: Diagnosis present

## 2012-04-19 DIAGNOSIS — R29810 Facial weakness: Secondary | ICD-10-CM | POA: Diagnosis present

## 2012-04-19 DIAGNOSIS — D696 Thrombocytopenia, unspecified: Secondary | ICD-10-CM | POA: Diagnosis present

## 2012-04-19 DIAGNOSIS — Z8249 Family history of ischemic heart disease and other diseases of the circulatory system: Secondary | ICD-10-CM

## 2012-04-19 DIAGNOSIS — Z8673 Personal history of transient ischemic attack (TIA), and cerebral infarction without residual deficits: Secondary | ICD-10-CM

## 2012-04-19 DIAGNOSIS — J449 Chronic obstructive pulmonary disease, unspecified: Secondary | ICD-10-CM | POA: Diagnosis present

## 2012-04-19 DIAGNOSIS — K219 Gastro-esophageal reflux disease without esophagitis: Secondary | ICD-10-CM

## 2012-04-19 DIAGNOSIS — I252 Old myocardial infarction: Secondary | ICD-10-CM

## 2012-04-19 LAB — DIFFERENTIAL
Basophils Absolute: 0 10*3/uL (ref 0.0–0.1)
Eosinophils Relative: 0 % (ref 0–5)
Lymphocytes Relative: 29 % (ref 12–46)
Lymphs Abs: 3.2 10*3/uL (ref 0.7–4.0)
Monocytes Absolute: 0.7 10*3/uL (ref 0.1–1.0)

## 2012-04-19 LAB — POCT I-STAT, CHEM 8
BUN: 7 mg/dL (ref 6–23)
Calcium, Ion: 1.17 mmol/L (ref 1.12–1.23)
Chloride: 102 mEq/L (ref 96–112)
Glucose, Bld: 134 mg/dL — ABNORMAL HIGH (ref 70–99)

## 2012-04-19 LAB — CBC
HCT: 45.2 % (ref 39.0–52.0)
HCT: 44.9 % (ref 39.0–52.0)
Hemoglobin: 16.6 g/dL (ref 13.0–17.0)
MCH: 31.8 pg (ref 26.0–34.0)
MCHC: 37 g/dL — ABNORMAL HIGH (ref 30.0–36.0)
MCV: 86.3 fL (ref 78.0–100.0)
RDW: 13.5 % (ref 11.5–15.5)
WBC: 11.2 10*3/uL — ABNORMAL HIGH (ref 4.0–10.5)

## 2012-04-19 LAB — TROPONIN I: Troponin I: <0.3 ng/mL (ref <0.30)

## 2012-04-19 LAB — COMPREHENSIVE METABOLIC PANEL
BUN: 8 mg/dL (ref 6–23)
CO2: 26 mEq/L (ref 19–32)
Calcium: 9.7 mg/dL (ref 8.4–10.5)
Creatinine, Ser: 1.02 mg/dL (ref 0.50–1.35)
GFR calc Af Amer: >90 mL/min (ref >90)
GFR calc non Af Amer: 85 mL/min — ABNORMAL LOW (ref >90)
Glucose, Bld: 139 mg/dL — ABNORMAL HIGH (ref 70–99)

## 2012-04-19 LAB — RAPID URINE DRUG SCREEN, HOSP PERFORMED
Cocaine: NOT DETECTED
Opiates: NOT DETECTED

## 2012-04-19 MED ORDER — INSULIN ASPART 100 UNIT/ML ~~LOC~~ SOLN: 0.0000 [IU] | SUBCUTANEOUS | Status: DC

## 2012-04-19 MED ORDER — INSULIN ASPART 100 UNIT/ML ~~LOC~~ SOLN: 0.0000 [IU] | Freq: Every day | SUBCUTANEOUS | Status: DC

## 2012-04-19 MED ORDER — NIFEDIPINE ER 30 MG PO TB24
30.0000 mg | ORAL_TABLET | Freq: Every day | ORAL | Status: DC
  Administered 2012-04-20 – 2012-04-23 (×4): 30 mg via ORAL
  Filled 2012-04-19 (×4): qty 1

## 2012-04-19 MED ORDER — LUBIPROSTONE 24 MCG PO CAPS
24.0000 ug | ORAL_CAPSULE | Freq: Two times a day (BID) | ORAL | Status: DC
  Administered 2012-04-20 – 2012-04-23 (×7): 24 ug via ORAL
  Filled 2012-04-19 (×11): qty 1

## 2012-04-19 MED ORDER — INSULIN ASPART 100 UNIT/ML ~~LOC~~ SOLN
0.0000 [IU] | Freq: Three times a day (TID) | SUBCUTANEOUS | Status: DC
  Administered 2012-04-20: 2 [IU] via SUBCUTANEOUS
  Administered 2012-04-20 – 2012-04-21 (×4): 3 [IU] via SUBCUTANEOUS
  Administered 2012-04-21: 8 [IU] via SUBCUTANEOUS
  Administered 2012-04-22: 3 [IU] via SUBCUTANEOUS
  Administered 2012-04-22: 5 [IU] via SUBCUTANEOUS
  Administered 2012-04-23 (×2): 3 [IU] via SUBCUTANEOUS

## 2012-04-19 MED ORDER — ACETAMINOPHEN 325 MG PO TABS: 650.0000 mg | ORAL_TABLET | ORAL | Status: DC | PRN

## 2012-04-19 MED ORDER — PANTOPRAZOLE SODIUM 40 MG PO TBEC
80.0000 mg | DELAYED_RELEASE_TABLET | Freq: Every day | ORAL | Status: DC
  Administered 2012-04-19 – 2012-04-23 (×5): 80 mg via ORAL
  Filled 2012-04-19 (×2): qty 2
  Filled 2012-04-19 (×2): qty 1
  Filled 2012-04-19: qty 2

## 2012-04-19 MED ORDER — OMEGA-3-ACID ETHYL ESTERS 1 G PO CAPS
1.0000 g | ORAL_CAPSULE | Freq: Every day | ORAL | Status: DC
  Administered 2012-04-19 – 2012-04-23 (×5): 1 g via ORAL
  Filled 2012-04-19 (×5): qty 1

## 2012-04-19 MED ORDER — GABAPENTIN 400 MG PO CAPS
400.0000 mg | ORAL_CAPSULE | Freq: Two times a day (BID) | ORAL | Status: DC
  Administered 2012-04-19 – 2012-04-23 (×8): 400 mg via ORAL
  Filled 2012-04-19 (×9): qty 1

## 2012-04-19 MED ORDER — INFLUENZA VIRUS VACC SPLIT PF IM SUSP
0.5000 mL | INTRAMUSCULAR | Status: AC
  Administered 2012-04-20: 0.5 mL via INTRAMUSCULAR
  Filled 2012-04-19: qty 0.5

## 2012-04-19 MED ORDER — PNEUMOCOCCAL VAC POLYVALENT 25 MCG/0.5ML IJ INJ
0.5000 mL | INJECTION | INTRAMUSCULAR | Status: AC
  Filled 2012-04-19: qty 0.5

## 2012-04-19 MED ORDER — CARVEDILOL 25 MG PO TABS
25.0000 mg | ORAL_TABLET | Freq: Two times a day (BID) | ORAL | Status: DC
  Administered 2012-04-19 – 2012-04-23 (×8): 25 mg via ORAL
  Filled 2012-04-19 (×9): qty 1

## 2012-04-19 MED ORDER — SODIUM CHLORIDE 0.9 % IV SOLN: INTRAVENOUS | Status: AC

## 2012-04-19 MED ORDER — FUROSEMIDE 80 MG PO TABS
80.0000 mg | ORAL_TABLET | Freq: Every day | ORAL | Status: DC
  Administered 2012-04-19 – 2012-04-23 (×5): 80 mg via ORAL
  Filled 2012-04-19 (×5): qty 1

## 2012-04-19 MED ORDER — GLYBURIDE 5 MG PO TABS
5.0000 mg | ORAL_TABLET | Freq: Two times a day (BID) | ORAL | Status: DC
  Administered 2012-04-20 – 2012-04-23 (×7): 5 mg via ORAL
  Filled 2012-04-19 (×10): qty 1

## 2012-04-19 MED ORDER — SODIUM CHLORIDE 0.9 % IV SOLN: INTRAVENOUS | Status: DC

## 2012-04-19 MED ORDER — OMEGA-3 FATTY ACIDS 1000 MG PO CAPS: 1.0000 g | ORAL_CAPSULE | Freq: Two times a day (BID) | ORAL | Status: DC

## 2012-04-19 MED ORDER — CLOPIDOGREL BISULFATE 75 MG PO TABS
75.0000 mg | ORAL_TABLET | Freq: Every day | ORAL | Status: DC
  Administered 2012-04-19 – 2012-04-23 (×5): 75 mg via ORAL
  Filled 2012-04-19 (×5): qty 1

## 2012-04-19 MED ORDER — POLYETHYLENE GLYCOL 3350 17 G PO PACK
17.0000 g | PACK | Freq: Every day | ORAL | Status: DC
  Administered 2012-04-20 – 2012-04-23 (×4): 17 g via ORAL
  Filled 2012-04-19 (×5): qty 1

## 2012-04-19 MED ORDER — LISINOPRIL 20 MG PO TABS
20.0000 mg | ORAL_TABLET | Freq: Every day | ORAL | Status: DC
  Administered 2012-04-20 – 2012-04-23 (×4): 20 mg via ORAL
  Filled 2012-04-19 (×4): qty 1

## 2012-04-19 MED ORDER — HEPARIN SODIUM (PORCINE) 5000 UNIT/ML IJ SOLN
5000.0000 [IU] | Freq: Three times a day (TID) | INTRAMUSCULAR | Status: DC
  Administered 2012-04-19 – 2012-04-23 (×11): 5000 [IU] via SUBCUTANEOUS
  Filled 2012-04-19 (×14): qty 1

## 2012-04-19 NOTE — Progress Notes (Signed)
Patient CBG 90, reports not eating all day. Snack of cheese and crackers given.

## 2012-04-19 NOTE — ED Notes (Signed)
Returned from CT scan, Dr. Roseanne Reno at bedside.

## 2012-04-19 NOTE — ED Notes (Addendum)
Family Contact Information:  Annamaria Boots (Sister) [phone]  Karoline Caldwell (Niece) [phone]

## 2012-04-19 NOTE — ED Notes (Signed)
Sudden onset stumbling, dysphasia witnessed by sisters. Upon EMS arrival pt with no weakness, difficulty speaking.

## 2012-04-19 NOTE — Consult Note (Signed)
Chief Complaint: New-onset speech difficulty.  HPI: Edwin White is an 46 y.o. male with a history of previous stroke, recurrent TIAs, hypertension, hyperlipidemia, type 2 diabetes mellitus, coronary artery disease with CABG and congestive heart failure, presenting with acute onset of speech difficulty. There was no associated focal weakness nor numbness. Patient has not been compliant with taking his medications including antiplatelet medication. He has not complained of chest pain today. He was last seen normal at 1630 today. CT scan of his head showed no left basal ganglia lacunar infarctions but no acute abnormality. NIH stroke score was 7, including in correct age and month which are thought to largely be due to expressive aphasia with paraphasic errors. Speech output improved including naming of objects as well as significant reduction in number paraphasic errors. Because of his fairly rapid improvement thrombolytic therapy with TPA was not elected.  LSN: [id number] today tPA Given: No: Rapid improvement in deficits. MRankin: 1  Past Medical History  Diagnosis Date  . CHF (congestive heart failure)     No recurrent admissions for heart failure.  . Ischemic cardiomyopathy     Ejection fraction improved from 15%-20% to 40%-45% in 2012 by echocardiogram  . Coronary artery disease     Status post coronary bypass grafting in 2007  . History of medication noncompliance   . Tobacco user   . Moderate mitral regurgitation by prior echocardiogram   . Type 2 diabetes mellitus   . Post-infarction apical thrombus     Anticoagulation discontinued  . Crescendo transient ischemic attacks   . Hyperlipidemia   . Heart attack September 13, 2004    Followed by bypass surgery  . Gastroparesis     Gastric emptying study scheduled  . Functional bowel disorder     Bloating and abdominal cramping as well as constipation requiring lubiprostone  . Hypertension     Poorly controlled    Family History    Problem Relation Age of Onset  . Coronary artery disease Father      Medications:  None history of poor compliance   Physical Examination: Blood pressure 174/98, temperature 98.3 F (36.8 C), temperature source Oral, resp. rate 18, height 6' (1.829 m), weight 99.791 kg (220 lb), SpO2 96.00%.  Neurologic Examination: Mental Status: Alert, incorrect responses for age and current month.  Speech fluent with slight word finding difficulty and paraphasic errors. Able to follow commands without difficulty. Cranial Nerves: II-Visual fields were normal. III/IV/VI-Pupils were equal and reacted. Extraocular movements were full and conjugate.    V/VII-no facial numbness and no facial weakness. VIII-normal. X-normal speech and symmetrical palatal movement. XII-midline tongue extension Motor: 5/5 bilaterally with normal tone and bulk Sensory: Normal throughout. Deep Tendon Reflexes: 2+ and symmetric. Plantars: Flexor bilaterally Cerebellar: Normal finger-to-nose and heel-to-shin testing bilaterally. Carotid auscultation: Normal   Ct Head Wo Contrast  04/19/2012  *RADIOLOGY REPORT*  Clinical Data: Right-sided aphasia  CT HEAD WITHOUT CONTRAST  Technique:  Contiguous axial images were obtained from the base of the skull through the vertex without contrast.  Comparison: [facility] CT head dated 12/24/2009, [facility] MRI brain dated 02/17/2008.  Findings: No evidence of parenchymal hemorrhage or extra-axial fluid collection. No mass lesion, mass effect, or midline shift.  No CT evidence of acute infarction.  Two lacunar infarcts in the left basal ganglia/thalamus (series 2/image 19), chronic.  Suspected subcortical white matter and periventricular small vessel ischemic changes, most prominently in the subcortical left frontal lobe (series 2/image 25).  Cerebral volume is age appropriate.  No ventriculomegaly.  Minimal fluid in the right maxillary sinus.  The visualized paranasal sinuses and mastoid air  cells are essentially clear.  No evidence of calvarial fracture.  IMPRESSION: No evidence of acute intracranial abnormality.  Old lacunar infarcts in the left basal ganglia/thalamus.  Suspected small vessel ischemic changes.  These results were called by telephone on 04/19/2012 at 1820 hours to Dr Roseanne Reno, who verbally acknowledged these results.   Original Report Authenticated By: Charline Bills, M.D.     Assessment: 46 y.o. male with significant risk factors for stroke as well as poor compliance with recommended preventive measures for stroke prevention, presenting with expressive aphasia which is improving rapidly. Probable recurrent TIA. However, acute infarction cannot be ruled out at this point.  Stroke Risk Factors - diabetes mellitus, hyperlipidemia and hypertension  Plan: 1. HgbA1c, fasting lipid panel 2. MRI, MRA  of the brain without contrast 3. PT consult, OT consult, Speech consult 4. Echocardiogram 5. Carotid dopplers 6. Prophylactic therapy-Antiplatelet med: Plavix 75 mg per day 7. Risk factor modification 8. Telemetry monitoring  C.R. Roseanne Reno, MD [facility]  04/19/2012, 6:37 PM

## 2012-04-19 NOTE — ED Notes (Signed)
Expressive aphasia improving per Dr. Roseanne Reno. Code stroke cancelled

## 2012-04-19 NOTE — ED Provider Notes (Signed)
History     CSN: [id number]  Arrival date & time 04/19/12  1755   First MD Initiated Contact with Patient 04/19/12 1803      Chief Complaint  Patient presents with  . Code Stroke    (Consider location/radiation/quality/duration/timing/severity/associated sxs/prior treatment) HPI Level 5 caveat due to need for intervention and aphasia Per EMS Pt with history of CAD, prior strokes was with family this afternoon in his normal state of health at 1630hrs. Shortly thereafter he began to have difficulty speaking. Brought by EMS as a code stroke, no reported focal weakness but unable to speak clearly.   Past Medical History  Diagnosis Date  . CHF (congestive heart failure)     No recurrent admissions for heart failure.  . Ischemic cardiomyopathy     Ejection fraction improved from 15%-20% to 40%-45% in 2012 by echocardiogram  . Coronary artery disease     Status post coronary bypass grafting in 2007  . History of medication noncompliance   . Tobacco user   . Moderate mitral regurgitation by prior echocardiogram   . Type 2 diabetes mellitus   . Post-infarction apical thrombus     Anticoagulation discontinued  . Crescendo transient ischemic attacks   . Hyperlipidemia   . Heart attack September 13, 2004    Followed by bypass surgery  . Gastroparesis     Gastric emptying study scheduled  . Functional bowel disorder     Bloating and abdominal cramping as well as constipation requiring lubiprostone  . Hypertension     Poorly controlled    Past Surgical History  Procedure Date  . Coronary artery bypass graft   . Cardiac catheterization     Family History  Problem Relation Age of Onset  . Coronary artery disease Father     History  Substance Use Topics  . Smoking status: Current Every Day Smoker -- 0.5 packs/day for 34 years    Types: Cigarettes  . Smokeless tobacco: Never Used  . Alcohol Use: Not on file      Review of Systems Unable to assess due to mental status.    Allergies  Pravastatin  Home Medications   Current Outpatient Rx  Name Route Sig Dispense Refill  . CARVEDILOL 25 MG PO TABS Oral Take 1 tablet (25 mg total) by mouth 2 (two) times daily. 60 tablet 6    D/C 12.5mg .  Dose increased on 08/06/11.  Marland Kitchen CLOPIDOGREL BISULFATE 75 MG PO TABS Oral Take 75 mg by mouth daily.      . OMEGA-3 FATTY ACIDS 1000 MG PO CAPS Oral Take 1 g by mouth 2 (two) times daily.     . FUROSEMIDE 80 MG PO TABS Oral Take 80 mg by mouth daily.      Marland Kitchen GABAPENTIN 400 MG PO CAPS Oral Take 400 mg by mouth 2 (two) times daily.    . GLYBURIDE 5 MG PO TABS Oral Take 5 mg by mouth 2 (two) times daily with a meal.     . HYOSCYAMINE SULFATE 0.125 MG SL SUBL Sublingual Place 1 tablet (0.125 mg total) under the tongue every 6 (six) hours as needed for cramping. 30 tablet 0  . HYOSCYAMINE SULFATE 0.125 MG PO TABS Oral Take 1 tablet (0.125 mg total) by mouth every 6 (six) hours as needed for cramping. Or abdominal pain. 30 tablet 1  . INSULIN GLARGINE 100 UNIT/ML [place] SOLN Subcutaneous Inject into the skin as directed.     Marland Kitchen LISINOPRIL 20 MG PO TABS Oral  Take 20 mg by mouth daily.      . LUBIPROSTONE 24 MCG PO CAPS Oral Take 24 mcg by mouth 2 (two) times daily with a meal.    . NIFEDIPINE 30 MG (OSM) PO TB24 Oral Take 30 mg by mouth daily.      Marland Kitchen OMEPRAZOLE 40 MG PO CPDR Oral Take 40 mg by mouth daily.    Marland Kitchen POLYETHYLENE GLYCOL 3350 PO PACK Oral Take 17 g by mouth daily.      Marland Kitchen POTASSIUM CHLORIDE CRYS ER 20 MEQ PO TBCR Oral Take 20 mEq by mouth daily.        There were no vitals taken for this visit.  Physical Exam  Nursing note and vitals reviewed. Constitutional: He appears well-developed and well-nourished.  HENT:  Head: Normocephalic and atraumatic.  Eyes: EOM are normal. Pupils are equal, round, and reactive to light.  Neck: Normal range of motion. Neck supple.  Cardiovascular: Normal rate, normal heart sounds and intact distal pulses.   Pulmonary/Chest: Effort normal and  breath sounds normal.  Abdominal: Bowel sounds are normal. He exhibits no distension. There is no tenderness.  Musculoskeletal: Normal range of motion. He exhibits no edema and no tenderness.  Neurological: He is alert. He has normal strength. No cranial nerve deficit or sensory deficit.       For full NIH stroke scale please see Neurology notes. Pt has an expressive aphasia but no other gross neuro deficits  Skin: Skin is warm and dry. No rash noted.  Psychiatric: He has a normal mood and affect.    ED Course  Procedures (including critical care time)  Labs Reviewed  CBC - Abnormal; Notable for the following:    WBC 11.2 (*)     MCHC 37.4 (*)     All other components within normal limits  COMPREHENSIVE METABOLIC PANEL - Abnormal; Notable for the following:    Glucose, Bld 139 (*)     GFR calc non Af Amer 85 (*)     All other components within normal limits  POCT I-STAT, CHEM 8 - Abnormal; Notable for the following:    Glucose, Bld 134 (*)     All other components within normal limits  PROTIME-INR  APTT  DIFFERENTIAL  TROPONIN I  URINE RAPID DRUG SCREEN (HOSP PERFORMED)   Ct Head Wo Contrast  04/19/2012  *RADIOLOGY REPORT*  Clinical Data: Right-sided aphasia  CT HEAD WITHOUT CONTRAST  Technique:  Contiguous axial images were obtained from the base of the skull through the vertex without contrast.  Comparison: [facility] CT head dated 12/24/2009, [facility] MRI brain dated 02/17/2008.  Findings: No evidence of parenchymal hemorrhage or extra-axial fluid collection. No mass lesion, mass effect, or midline shift.  No CT evidence of acute infarction.  Two lacunar infarcts in the left basal ganglia/thalamus (series 2/image 19), chronic.  Suspected subcortical white matter and periventricular small vessel ischemic changes, most prominently in the subcortical left frontal lobe (series 2/image 25).  Cerebral volume is age appropriate.  No ventriculomegaly.  Minimal fluid in the right maxillary  sinus.  The visualized paranasal sinuses and mastoid air cells are essentially clear.  No evidence of calvarial fracture.  IMPRESSION: No evidence of acute intracranial abnormality.  Old lacunar infarcts in the left basal ganglia/thalamus.  Suspected small vessel ischemic changes.  These results were called by telephone on 04/19/2012 at 1820 hours to Dr Roseanne Reno, who verbally acknowledged these results.   Original Report Authenticated By: Charline Bills, M.D.  No diagnosis found.    MDM  Dr. Roseanne Reno at bedside with Code Stroke team. Symptoms improving during exam, not a tPA candidate. Recommend admit for further evaluation.    Date: 04/19/2012  Rate: 87  Rhythm: normal sinus rhythm and premature ventricular contractions (PVC)  QRS Axis: left  Intervals: normal  ST/T Wave abnormalities: normal  Conduction Disutrbances:none  Narrative Interpretation:   Old EKG Reviewed: changes noted, ST changed improved          Charles B. Bernette Mayers, MD 04/19/12 2101

## 2012-04-20 ENCOUNTER — Observation Stay (HOSPITAL_COMMUNITY): Payer: Medicaid Other

## 2012-04-20 DIAGNOSIS — I5022 Chronic systolic (congestive) heart failure: Secondary | ICD-10-CM

## 2012-04-20 DIAGNOSIS — I2510 Atherosclerotic heart disease of native coronary artery without angina pectoris: Secondary | ICD-10-CM

## 2012-04-20 DIAGNOSIS — G459 Transient cerebral ischemic attack, unspecified: Secondary | ICD-10-CM

## 2012-04-20 DIAGNOSIS — D696 Thrombocytopenia, unspecified: Secondary | ICD-10-CM | POA: Diagnosis present

## 2012-04-20 LAB — GLUCOSE, CAPILLARY
Glucose-Capillary: 154 mg/dL — ABNORMAL HIGH (ref 70–99)
Glucose-Capillary: 118 mg/dL — ABNORMAL HIGH (ref 70–99)

## 2012-04-20 LAB — LIPID PANEL
Cholesterol: 245 mg/dL — ABNORMAL HIGH (ref 0–200)
Triglycerides: 187 mg/dL — ABNORMAL HIGH (ref <150)

## 2012-04-20 LAB — HEMOGLOBIN A1C: Hgb A1c MFr Bld: 8.5 % — ABNORMAL HIGH (ref <5.7)

## 2012-04-20 NOTE — Progress Notes (Signed)
*  PRELIMINARY RESULTS* Vascular Ultrasound Carotid Duplex (Doppler) has been completed.  Preliminary findings: Bilateral:  No evidence of hemodynamically significant internal carotid artery stenosis.   Vertebral artery flow is antegrade.      Farrel Demark, RDMS, RVT 04/20/2012, 3:30 PM

## 2012-04-20 NOTE — Progress Notes (Signed)
[facility] PROGRESS NOTE  KNOWLEDGE ESCANDON ZOX:[id number] DOB: 1965-10-23 DOA: 04/19/2012 PCP: Sheila Oats, MD   History of present illness  Edwin White is an 46 y.o. male with a history of previous stroke, HTN, prior TIA, hyperlipidemia, type 2 diabetes mellitus, coronary artery disease with CABG and congestive heart failure, to the ER with sudden onset of aphasia. He denied any slurred speech, upper or lower extremity weakness. He has no HA, stiff neck, fever or chills. Patient has not been compliant with taking his medications including antiplatelet medication. He has not complained of chest pain today. He was last seen normal at 1630 today. CT scan of his head showed no left basal ganglia lacunar infarctions but no acute abnormality. He has improvement in his speech once he is in the ER. Because of his fairly rapid improvement thrombolytic therapy with TPA was not elected. After neurology consulted, hospitalist was asked to admit him for TIA work up.  Assessment/Plan: 1. TIA: MRI does not show acute infarct. Seems to have mild persisting dysarthria and left LMN facial weakness. Followup 2-D echo and carotid Dopplers. Plavix for secondary stroke prevention. Modify secondary risk factors. Counseled patient regarding compliance with medications. 2. Uncontrolled DM 2: Reasonable inpatient control. Continue glyburide and Sliding scale insulin 3. Hypertension: Reasonable inpatient control. Continue Coreg, lisinopril and Procardia 4. Mild thrombocytopenia: Follow CBC name. 5. Hyperlipidemia: Patient is allergic to pravastatin. Continue fish oil. 6. Ischemic cardiomyopathy: In 07/12 echo showed LVEF of 40-45%. Continue is Plavix, lisinopril. Follow repeat echo. Not in overt heart failure. Continue Lasix. 7. History of chronic systolic congestive heart failure: Compensated. Continue Lasix. 8. Noncompliance: Counseled regarding compliance with medical therapy. Patient verbalized  understanding.  Code Status: Full Family Communication: Discussed directly with patient. Disposition Plan: Home   Consultants:  Neurology  Procedures:  MRI brain  CT head  Antibiotics:  None  HPI/Subjective: Indicates that he still has mild facial droop and slurred speech. Denies chest pain or palpitations.  Objective: Filed Vitals:   04/19/12 2121 04/20/12 0244 04/20/12 0605 04/20/12 1333  BP: 148/85 127/76 143/69 122/75  Pulse: 83 84 74 84  Temp: 98.3 F (36.8 C)  98.1 F (36.7 C) 100.6 F (38.1 C)  TempSrc: Oral  Oral   Resp: 20 18 20 20   Height: 6' (1.829 m)     Weight: 98.748 kg (217 lb 11.2 oz)     SpO2: 98% 96% 100% 96%    Intake/Output Summary (Last 24 hours) at 04/20/12 1437 Last data filed at 04/20/12 1333  Gross per 24 hour  Intake    640 ml  Output      0 ml  Net    640 ml   Filed Weights   04/19/12 1816 04/19/12 2121  Weight: 99.791 kg (220 lb) 98.748 kg (217 lb 11.2 oz)    Exam:   General exam: Comfortable.  Respiratory system: Clear. No increased work of breathing.  Cardiovascular system: S1 and S2, regular. No JVD, murmurs or gallops. Telemetry shows sinus rhythm with occasional PACs and PVCs.  Gastrointestinal system: Abdomen is nondistended, soft and nontender. Normal heart sounds heard.  Central nervous system: Alert and oriented. Mild dysarthria and subtle left lower motor neuron facial weakness.  Extremities: Symmetric 5 x 5 power.  Data Reviewed: Basic Metabolic Panel:  Lab 04/19/12 8119 04/19/12 1813 04/19/12 1810  NA -- 141 139  K -- 3.8 3.8  CL -- 102 101  CO2 -- -- 26  GLUCOSE -- 134* 139*  BUN -- 7 8  CREATININE 0.98 1.10 1.02  CALCIUM -- -- 9.7  MG -- -- --  PHOS -- -- --   Liver Function Tests:  Lab 04/19/12 1810  AST 29  ALT 17  ALKPHOS 75  BILITOT 0.5  PROT 7.6  ALBUMIN 4.4   No results found for this basename: LIPASE:5,AMYLASE:5 in the last 168 hours No results found for this basename:  AMMONIA:5 in the last 168 hours CBC:  Lab 04/19/12 2130 04/19/12 1813 04/19/12 1810  WBC 11.5* -- 11.2*  NEUTROABS -- -- 7.2  HGB 16.6 16.3 16.9  HCT 44.9 48.0 45.2  MCV 86.0 -- 86.3  PLT 146* -- 155   Cardiac Enzymes:  Lab 04/19/12 1810  CKTOTAL --  CKMB --  CKMBINDEX --  TROPONINI <0.30   BNP (last 3 results) No results found for this basename: PROBNP:3 in the last 8760 hours CBG:  Lab 04/20/12 1150 04/20/12 0654 04/19/12 2234  GLUCAP 191* 133* 90    No results found for this or any previous visit (from the past 240 hour(s)).   Studies: Ct Head Wo Contrast  04/19/2012  *RADIOLOGY REPORT*  Clinical Data: Right-sided aphasia  CT HEAD WITHOUT CONTRAST  Technique:  Contiguous axial images were obtained from the base of the skull through the vertex without contrast.  Comparison: [facility] CT head dated 12/24/2009, [facility] MRI brain dated 02/17/2008.  Findings: No evidence of parenchymal hemorrhage or extra-axial fluid collection. No mass lesion, mass effect, or midline shift.  No CT evidence of acute infarction.  Two lacunar infarcts in the left basal ganglia/thalamus (series 2/image 19), chronic.  Suspected subcortical white matter and periventricular small vessel ischemic changes, most prominently in the subcortical left frontal lobe (series 2/image 25).  Cerebral volume is age appropriate.  No ventriculomegaly.  Minimal fluid in the right maxillary sinus.  The visualized paranasal sinuses and mastoid air cells are essentially clear.  No evidence of calvarial fracture.  IMPRESSION: No evidence of acute intracranial abnormality.  Old lacunar infarcts in the left basal ganglia/thalamus.  Suspected small vessel ischemic changes.  These results were called by telephone on 04/19/2012 at 1820 hours to Dr Roseanne Reno, who verbally acknowledged these results.   Original Report Authenticated By: Charline Bills, M.D.    Mr Brain Ltd W/o Cm  04/20/2012  *RADIOLOGY REPORT*  Clinical Data:  Aphasia  MRI HEAD WITHOUT CONTRAST  Technique:  Multiplanar, multiecho pulse sequences of the brain and surrounding structures were obtained according to standard protocol without intravenous contrast.  Comparison: CT 04/19/2012.  Findings: The study is limited to diffusion weighted imaging only. This sequence is of good quality and shows no acute infarct.  There is evidence of chronic ischemia in the left basal ganglia, as noted on the recent CT.  The patient refused further imaging.  IMPRESSION: Limited study.  Negative for acute infarct.   Original Report Authenticated By: Camelia Phenes, M.D.     Scheduled Meds:    . sodium chloride   Intravenous STAT  . carvedilol  25 mg Oral BID  . clopidogrel  75 mg Oral Daily  . furosemide  80 mg Oral Daily  . gabapentin  400 mg Oral BID  . glyBURIDE  5 mg Oral BID WC  . heparin  5,000 Units Subcutaneous Q8H  . influenza  inactive virus vaccine  0.5 mL Intramuscular Tomorrow-1000  . insulin aspart  0-15 Units Subcutaneous TID WC  . insulin aspart  0-5 Units Subcutaneous QHS  . lisinopril  20 mg Oral Daily  . lubiprostone  24 mcg Oral BID WC  . NIFEdipine  30 mg Oral Daily  . omega-3 acid ethyl esters  1 g Oral Daily  . pantoprazole  80 mg Oral Daily  . pneumococcal 23 valent vaccine  0.5 mL Intramuscular Tomorrow-1000  . polyethylene glycol  17 g Oral Daily  . DISCONTD: fish oil-omega-3 fatty acids  1 g Oral BID  . DISCONTD: insulin aspart  0-15 Units Subcutaneous Q4H   Continuous Infusions:    . sodium chloride 50 mL/hr at 04/19/12 2202    Principal Problem:  *TIA (transient ischemic attack) Active Problems:  DIAB W/O MENTION COMP TYPE II/UNS TYPE UNCNTRL  Pure hypercholesterolemia  NONDEPENDENT TOBACCO USE DISORDER  Old myocardial infarction  CHRONIC AIRWAY OBSTRUCTION NEC  PERS HX NONCOMPLIANCE W/MED TX PRS HAZARDS HLTH  Gastroparesis  Ischemic cardiomyopathy  Hypertension     [facility]  [facility] Pager  [phone]. If 8PM-8AM, please contact night-coverage at [email or web address], password [facility] 04/20/2012, 2:37 PM  LOS: 1 day

## 2012-04-20 NOTE — Progress Notes (Signed)
Edwin White is an 46 y.o. male with a history of previous stroke, recurrent TIAs, hypertension, hyperlipidemia, type 2 diabetes mellitus, coronary artery disease with CABG and congestive heart failure, presenting with acute onset of speech difficulty, lasting 3-4 hours, no TPA was given, MRI DWI study was negative for acute stroke,     CT scan of his head showed old left basal ganglia lacunar infarctions but no acute abnormality.     He is back to his baseline today, in sleep, reluctant to be examined.  Past Medical History   Diagnosis  Date   .  CHF (congestive heart failure)      No recurrent admissions for heart failure.   .  Ischemic cardiomyopathy      Ejection fraction improved from 15%-20% to 40%-45% in 2012 by echocardiogram   .  Coronary artery disease      Status post coronary bypass grafting in 2007   .  History of medication noncompliance    .  Tobacco user    .  Moderate mitral regurgitation by prior echocardiogram    .  Type 2 diabetes mellitus    .  Post-infarction apical thrombus      Anticoagulation discontinued   .  Crescendo transient ischemic attacks    .  Hyperlipidemia    .  Heart attack  September 13, 2004     Followed by bypass surgery   .  Gastroparesis      Gastric emptying study scheduled   .  Functional bowel disorder      Bloating and abdominal cramping as well as constipation requiring lubiprostone   .  Hypertension      Poorly controlled    Family History   Problem  Relation  Age of Onset   .  Coronary artery disease  Father    Medications:  None history of poor compliance  Physical Examination:  Blood pressure 174/98, temperature 98.3 F (36.8 C), temperature source Oral, resp. rate 18, height 6' (1.829 m), weight 99.791 kg (220 lb), SpO2 96.00%.  Neurologic Examination:  Mental Status:    Able to follow commands without difficulty, no aphasia, no dysarthria.  Cranial Nerves:  II-Visual fields were normal.  III/IV/VI-Pupils were equal and  reacted. Extraocular movements were full and conjugate.  V/VII-no facial numbness and no facial weakness.  VIII-normal.  X-normal speech and symmetrical palatal movement.  XII-midline tongue extension  Motor: 5/5 bilaterally with normal tone and bulk  Sensory: Normal throughout.  Deep Tendon Reflexes: 2+ and symmetric.  Plantars: Flexor bilaterally  Cerebellar: Normal finger-to-nose and heel-to-shin testing bilaterally.  Carotid auscultation: Normal  Ct Head Wo Contrast: Two lacunar infarcts in the left basal ganglia/thalamus   Assessment: 46 y.o. male with significant risk factors for stroke as well as poor compliance with recommended preventive measures for stroke prevention, presenting with expressive aphasia which is improving rapidly. Probable recurrent TIA. However, acute infarction cannot be ruled out at this point.  Stroke Risk Factors - diabetes mellitus, hyperlipidemia and hypertension   Plan:  1.  Plavix 75 mg per day  2. Complete evaluation.

## 2012-04-20 NOTE — H&P (Signed)
[facility] History and Physical  Edwin White NGE:[id number] DOB: 10/09/1965    PCP:   Gentry Fitz.   Chief Complaint: aphasia     HPI:  Edwin White is an 46 y.o. male with a history of previous stroke, HTN, prior TIA, hyperlipidemia, type 2 diabetes mellitus, coronary artery disease with CABG and congestive heart failure, to the ER with sudden onset of aphasia.  He denied any slurred speech, upper or lower extremity weakness.  He has no HA, stiff neck, fever or chills. Patient has not been compliant with taking his medications including antiplatelet medication. He has not complained of chest pain today. He was last seen normal at 1630 today. CT scan of his head showed no left basal ganglia lacunar infarctions but no acute abnormality. He has improvement in his speech once he is in the ER.  Because of his fairly rapid improvement thrombolytic therapy with TPA was not elected.  After neurology consulted, hospitalist was asked to admit him for TIA work up.  Rewiew of Systems:  Constitutional: Negative for malaise, fever and chills. No significant weight loss or weight gain Eyes: Negative for eye pain, redness and discharge, diplopia, visual changes, or flashes of light. ENMT: Negative for ear pain, hoarseness, nasal congestion, sinus pressure and sore throat. No headaches; tinnitus, drooling, or problem swallowing. Cardiovascular: Negative for chest pain, palpitations, diaphoresis, dyspnea and peripheral edema. ; No orthopnea, PND Respiratory: Negative for cough, hemoptysis, wheezing and stridor. No pleuritic chestpain. Gastrointestinal: Negative for nausea, vomiting, diarrhea, constipation, abdominal pain, melena, blood in stool, hematemesis, jaundice and rectal bleeding.    Genitourinary: Negative for frequency, dysuria, incontinence,flank pain and hematuria; Musculoskeletal: Negative for back pain and neck pain. Negative for swelling and trauma.;  Skin: . Negative for pruritus,  rash, abrasions, bruising and skin lesion.; ulcerations Neuro: Negative for headache, lightheadedness and neck stiffness. Negative for weakness, altered level of consciousness , altered mental status, extremity weakness, burning feet, involuntary movement, seizure and syncope.  Psych: negative for anxiety, depression, insomnia, tearfulness, panic attacks, hallucinations, paranoia, suicidal or homicidal ideation    Past Medical History  Diagnosis Date  . CHF (congestive heart failure)     No recurrent admissions for heart failure.  . Ischemic cardiomyopathy     Ejection fraction improved from 15%-20% to 40%-45% in 2012 by echocardiogram  . Coronary artery disease     Status post coronary bypass grafting in 2007  . History of medication noncompliance   . Tobacco user   . Moderate mitral regurgitation by prior echocardiogram   . Type 2 diabetes mellitus   . Post-infarction apical thrombus     Anticoagulation discontinued  . Crescendo transient ischemic attacks   . Hyperlipidemia   . Heart attack September 13, 2004    Followed by bypass surgery  . Gastroparesis     Gastric emptying study scheduled  . Functional bowel disorder     Bloating and abdominal cramping as well as constipation requiring lubiprostone  . Hypertension     Poorly controlled    Past Surgical History  Procedure Date  . Coronary artery bypass graft   . Cardiac catheterization     Medications:  HOME MEDS: Prior to Admission medications   Medication Sig Start Date End Date Taking? Authorizing Provider  carvedilol (COREG) 25 MG tablet Take 1 tablet (25 mg total) by mouth 2 (two) times daily. 08/06/11 08/05/12 Yes June Leap, MD  clopidogrel (PLAVIX) 75 MG tablet Take 75 mg by mouth daily.  Yes Historical Provider, MD  fish oil-omega-3 fatty acids 1000 MG capsule Take 1 g by mouth 2 (two) times daily.    Yes Historical Provider, MD  furosemide (LASIX) 80 MG tablet Take 80 mg by mouth daily.     Yes Historical  Provider, MD  gabapentin (NEURONTIN) 400 MG capsule Take 400 mg by mouth 2 (two) times daily.   Yes Historical Provider, MD  glyBURIDE (DIABETA) 5 MG tablet Take 5 mg by mouth 2 (two) times daily with a meal.    Yes Historical Provider, MD  insulin glargine (LANTUS SOLOSTAR) 100 UNIT/ML injection Inject into the skin as directed.    Yes Historical Provider, MD  lisinopril (PRINIVIL,ZESTRIL) 20 MG tablet Take 20 mg by mouth daily.     Yes Historical Provider, MD  lubiprostone (AMITIZA) 24 MCG capsule Take 24 mcg by mouth 2 (two) times daily with a meal.   Yes Historical Provider, MD  NIFEdipine (PROCARDIA-XL) 30 MG (OSM) 24 hr tablet Take 30 mg by mouth daily.     Yes Historical Provider, MD  omeprazole (PRILOSEC) 40 MG capsule Take 40 mg by mouth daily.   Yes Historical Provider, MD  polyethylene glycol (MIRALAX / GLYCOLAX) packet Take 17 g by mouth daily.     Yes Historical Provider, MD  potassium chloride SA (K-DUR,KLOR-CON) 20 MEQ tablet Take 20 mEq by mouth daily.     Yes Historical Provider, MD  hyoscyamine (LEVSIN SL) 0.125 MG SL tablet Place 1 tablet (0.125 mg total) under the tongue every 6 (six) hours as needed for cramping. 10/08/11 10/18/11  June Leap, MD  hyoscyamine (LEVSIN, ANASPAZ) 0.125 MG tablet Take 1 tablet (0.125 mg total) by mouth every 6 (six) hours as needed for cramping. Or abdominal pain. 12/05/11 12/15/11  June Leap, MD     Allergies:  Allergies  Allergen Reactions  . Pravastatin Other (See Comments)    PT CANNOT NOT WALK OR MOVE.    Social History:   reports that he has been smoking Cigarettes.  He has a 17 pack-year smoking history. He has never used smokeless tobacco. He reports that he does not drink alcohol. His drug history not on file.  Family History: Family History  Problem Relation Age of Onset  . Coronary artery disease Father      Physical Exam: Filed Vitals:   04/19/12 2030 04/19/12 2121 04/20/12 0244 04/20/12 0605  BP: 159/95 148/85 127/76  143/69  Pulse: 81 83 84 74  Temp:  98.3 F (36.8 C)  98.1 F (36.7 C)  TempSrc:  Oral  Oral  Resp: 23 20 18 20   Height:  6' (1.829 m)    Weight:  98.748 kg (217 lb 11.2 oz)    SpO2: 97% 98% 96% 100%   Blood pressure 143/69, pulse 74, temperature 98.1 F (36.7 C), temperature source Oral, resp. rate 20, height 6' (1.829 m), weight 98.748 kg (217 lb 11.2 oz), SpO2 100.00%.  GEN:  Pleasant patient lying in the stretcher in no acute distress; cooperative with exam. PSYCH:  alert and oriented x4; does not appear anxious or depressed; affect is appropriate. HEENT: Mucous membranes pink and anicteric; PERRLA; EOM intact; no cervical lymphadenopathy nor thyromegaly or carotid bruit; no JVD; There were no stridor. Neck is very supple. Breasts:: Not examined CHEST WALL: No tenderness CHEST: Normal respiration, clear to auscultation bilaterally.  HEART: Regular rate and rhythm.  There are no murmur, rub, or gallops.   BACK: No kyphosis or scoliosis;  no CVA tenderness ABDOMEN: soft and non-tender; no masses, no organomegaly, normal abdominal bowel sounds; no pannus; no intertriginous candida. There is no rebound and no distention. Rectal Exam: Not done EXTREMITIES: No bone or joint deformity; age-appropriate arthropathy of the hands and knees; no edema; no ulcerations.  There is no calf tenderness. Genitalia: not examined PULSES: 2+ and symmetric SKIN: Normal hydration no rash or ulceration CNS: Cranial nerves 2-12 grossly intact no focal lateralizing neurologic deficit. He is having expressive aphasia, facial symmetry and tongue midline. DTR are normal bilaterally, cerebella exam is intact, barbinski is negative and strengths are equaled bilaterally.  No sensory loss.   Labs on Admission:  Basic Metabolic Panel:  Lab 04/19/12 4098 04/19/12 1813 04/19/12 1810  NA -- 141 139  K -- 3.8 3.8  CL -- 102 101  CO2 -- -- 26  GLUCOSE -- 134* 139*  BUN -- 7 8  CREATININE 0.98 1.10 1.02  CALCIUM --  -- 9.7  MG -- -- --  PHOS -- -- --   Liver Function Tests:  Lab 04/19/12 1810  AST 29  ALT 17  ALKPHOS 75  BILITOT 0.5  PROT 7.6  ALBUMIN 4.4   No results found for this basename: LIPASE:5,AMYLASE:5 in the last 168 hours No results found for this basename: AMMONIA:5 in the last 168 hours CBC:  Lab 04/19/12 2130 04/19/12 1813 04/19/12 1810  WBC 11.5* -- 11.2*  NEUTROABS -- -- 7.2  HGB 16.6 16.3 16.9  HCT 44.9 48.0 45.2  MCV 86.0 -- 86.3  PLT 146* -- 155   Cardiac Enzymes:  Lab 04/19/12 1810  CKTOTAL --  CKMB --  CKMBINDEX --  TROPONINI <0.30    CBG:  Lab 04/19/12 2234  GLUCAP 90     Radiological Exams on Admission: Ct Head Wo Contrast  04/19/2012  *RADIOLOGY REPORT*  Clinical Data: Right-sided aphasia  CT HEAD WITHOUT CONTRAST  Technique:  Contiguous axial images were obtained from the base of the skull through the vertex without contrast.  Comparison: [facility] CT head dated 12/24/2009, [facility] MRI brain dated 02/17/2008.  Findings: No evidence of parenchymal hemorrhage or extra-axial fluid collection. No mass lesion, mass effect, or midline shift.  No CT evidence of acute infarction.  Two lacunar infarcts in the left basal ganglia/thalamus (series 2/image 19), chronic.  Suspected subcortical white matter and periventricular small vessel ischemic changes, most prominently in the subcortical left frontal lobe (series 2/image 25).  Cerebral volume is age appropriate.  No ventriculomegaly.  Minimal fluid in the right maxillary sinus.  The visualized paranasal sinuses and mastoid air cells are essentially clear.  No evidence of calvarial fracture.  IMPRESSION: No evidence of acute intracranial abnormality.  Old lacunar infarcts in the left basal ganglia/thalamus.  Suspected small vessel ischemic changes.  These results were called by telephone on 04/19/2012 at 1820 hours to Dr Roseanne Reno, who verbally acknowledged these results.   Original Report Authenticated By: Charline Bills, M.D.     EKG: Independently reviewed. NSR, no acute ST T changes, one PVC.   Assessment/Plan Present on Admission:  .TIA (transient ischemic attack) .Pure hypercholesterolemia .Ischemic cardiomyopathy .NONDEPENDENT TOBACCO USE DISORDER .DIAB W/O MENTION COMP TYPE II/UNS TYPE UNCNTRL .Gastroparesis .CHRONIC AIRWAY OBSTRUCTION NEC .Hypertension   PLAN:  Will admit him for TIA work up.  These will include MRI, MRA of brain along with ECHO and coratid doppler.  He will receive ASA.  I strongly recommend that he quit smoking.  For his DM, will use SSI.  He will have his statin continued.  Will get his SBP in the 160 range.  Neurology will be following as well.  He is stable, full code, and will be admitted to [facility] service.  Other plans as per orders.  Code Status: FULL Unk Lightning, MD. [facility] Pager [phone] 7pm to 7am.  04/20/2012, 6:22 AM

## 2012-04-20 NOTE — Progress Notes (Signed)
MRI -- pt firmly refused any further imaging and the offer to call for antianxiety medications; nauseated when pt arrived to dept for exam. DWI only was completed.

## 2012-04-20 NOTE — Progress Notes (Signed)
  Echocardiogram 2D Echocardiogram has been performed.  Kailen Name 04/20/2012, 12:06 PM

## 2012-04-21 ENCOUNTER — Inpatient Hospital Stay (HOSPITAL_COMMUNITY): Payer: Medicaid Other

## 2012-04-21 DIAGNOSIS — E876 Hypokalemia: Secondary | ICD-10-CM

## 2012-04-21 LAB — BASIC METABOLIC PANEL
CO2: 29 mEq/L (ref 19–32)
Calcium: 9.2 mg/dL (ref 8.4–10.5)
GFR calc Af Amer: >90 mL/min (ref >90)
GFR calc non Af Amer: 79 mL/min — ABNORMAL LOW (ref >90)
Sodium: 138 mEq/L (ref 135–145)

## 2012-04-21 LAB — CBC
Platelets: 131 10*3/uL — ABNORMAL LOW (ref 150–400)
RBC: 4.9 MIL/uL (ref 4.22–5.81)
RDW: 13.8 % (ref 11.5–15.5)
WBC: 7.3 10*3/uL (ref 4.0–10.5)

## 2012-04-21 LAB — GLUCOSE, CAPILLARY
Glucose-Capillary: 176 mg/dL — ABNORMAL HIGH (ref 70–99)
Glucose-Capillary: 182 mg/dL — ABNORMAL HIGH (ref 70–99)
Glucose-Capillary: 200 mg/dL — ABNORMAL HIGH (ref 70–99)

## 2012-04-21 MED ORDER — LORAZEPAM 2 MG/ML IJ SOLN
1.0000 mg | Freq: Once | INTRAMUSCULAR | Status: AC
  Administered 2012-04-21: 1 mg via INTRAVENOUS
  Filled 2012-04-21: qty 1

## 2012-04-21 MED ORDER — POTASSIUM CHLORIDE CRYS ER 20 MEQ PO TBCR
40.0000 meq | EXTENDED_RELEASE_TABLET | Freq: Once | ORAL | Status: AC
  Administered 2012-04-21: 40 meq via ORAL
  Filled 2012-04-21: qty 2

## 2012-04-21 MED ORDER — INSULIN GLARGINE 100 UNIT/ML ~~LOC~~ SOLN
10.0000 [IU] | Freq: Every day | SUBCUTANEOUS | Status: DC
  Administered 2012-04-21: 10 [IU] via SUBCUTANEOUS

## 2012-04-21 NOTE — Progress Notes (Addendum)
Inpatient Diabetes Program Recommendations  AACE/ADA: New Consensus Statement on Inpatient Glycemic Control (2013)  Target Ranges:  Prepandial:   less than 140 mg/dL      Peak postprandial:   less than 180 mg/dL (1-2 hours)      Critically ill patients:  140 - 180 mg/dL   Reason for Visit: Results for Edwin White, Edwin White (MRN [id number]) as of 04/21/2012 15:37  Ref. Range 04/21/2012 06:35 04/21/2012 06:49 04/21/2012 11:38  Glucose-Capillary Latest Range: 70-99 mg/dL 409 (H) 811 (H) 914 (H)  Note according to health history, patient was on Lantus prior to admission (however dose not documented).  Consider adding Lantus 15 units daily while in the hospital.  Note A1C=8.5% indicating sub-optimal glycemic control prior to admit. Will follow.     Addendum 1630:  Spoke briefly to patient regarding diabetes control.  He states he was taking Lantus 40 units daily prior to admit.  He see's Dr. Clelia Croft in [place].

## 2012-04-21 NOTE — Progress Notes (Signed)
Stroke Team Progress Note  HISTORY Edwin White is an 46 y.o. male with a history of previous stroke, recurrent TIAs, hypertension, hyperlipidemia, type 2 diabetes mellitus, coronary artery disease with CABG and congestive heart failure, presenting with acute onset of speech difficulty. There was no associated focal weakness nor numbness. Patient has not been compliant with taking his medications including antiplatelet medication. He has not complained of chest pain today. He was last seen normal at 1630 04/19/2012. CT scan of his head showed no left basal ganglia lacunar infarctions but no acute abnormality. NIH stroke score was 7, including in correct age and month which are thought to largely be due to expressive aphasia with paraphasic errors. Speech output improved including naming of objects as well as significant reduction in number paraphasic errors. Because of his fairly rapid improvement thrombolytic therapy with TPA was not elected.  SUBJECTIVE No family is at the bedside.  Overall he feels his condition is completely resolved.   OBJECTIVE Most recent Vital Signs: Filed Vitals:   04/20/12 2218 04/21/12 0124 04/21/12 0559 04/21/12 1000  BP: 111/65 123/72 118/70 133/82  Pulse: 72 68 78 71  Temp: 98.4 F (36.9 C) 97.7 F (36.5 C) 98.1 F (36.7 C) 97.9 F (36.6 C)  TempSrc: Oral Oral Oral Oral  Resp: 18 20 20 17   Height:      Weight:      SpO2: 99% 95% 99% 96%   CBG (last 3)   Basename 04/21/12 1138 04/21/12 0649 04/21/12 0635  GLUCAP 178* 182* 176*    IV Fluid Intake:     . DISCONTD: sodium chloride 50 mL/hr at 04/19/12 2202    MEDICATIONS    . sodium chloride   Intravenous STAT  . carvedilol  25 mg Oral BID  . clopidogrel  75 mg Oral Daily  . furosemide  80 mg Oral Daily  . gabapentin  400 mg Oral BID  . glyBURIDE  5 mg Oral BID WC  . heparin  5,000 Units Subcutaneous Q8H  . influenza  inactive virus vaccine  0.5 mL Intramuscular Tomorrow-1000  . insulin aspart   0-15 Units Subcutaneous TID WC  . insulin aspart  0-5 Units Subcutaneous QHS  . lisinopril  20 mg Oral Daily  . LORazepam  1 mg Intravenous Once  . lubiprostone  24 mcg Oral BID WC  . NIFEdipine  30 mg Oral Daily  . omega-3 acid ethyl esters  1 g Oral Daily  . pantoprazole  80 mg Oral Daily  . pneumococcal 23 valent vaccine  0.5 mL Intramuscular Tomorrow-1000  . polyethylene glycol  17 g Oral Daily   PRN:  acetaminophen  Diet:  Carb Control thin liquids Activity:   Bathroom privileges with assistance DVT Prophylaxis:  Heparin 5000 units sq tid  CLINICALLY SIGNIFICANT STUDIES Basic Metabolic Panel:  Lab 04/21/12 6295 04/19/12 2130 04/19/12 1813 04/19/12 1810  NA 138 -- 141 --  K 3.0* -- 3.8 --  CL 100 -- 102 --  CO2 29 -- -- 26  GLUCOSE 167* -- 134* --  BUN 12 -- 7 --  CREATININE 1.08 0.98 -- --  CALCIUM 9.2 -- -- 9.7  MG -- -- -- --  PHOS -- -- -- --   Liver Function Tests:  Lab 04/19/12 1810  AST 29  ALT 17  ALKPHOS 75  BILITOT 0.5  PROT 7.6  ALBUMIN 4.4   CBC:  Lab 04/21/12 0500 04/19/12 2130 04/19/12 1810  WBC 7.3 11.5* --  NEUTROABS -- --  7.2  HGB 15.3 16.6 --  HCT 42.5 44.9 --  MCV 86.7 86.0 --  PLT 131* 146* --   Coagulation:  Lab 04/19/12 1810  LABPROT 14.2  INR 1.11   Cardiac Enzymes:  Lab 04/19/12 1810  CKTOTAL --  CKMB --  CKMBINDEX --  TROPONINI <0.30   Urinalysis: No results found for this basename: COLORURINE:2,APPERANCEUR:2,LABSPEC:2,PHURINE:2,GLUCOSEU:2,HGBUR:2,BILIRUBINUR:2,KETONESUR:2,PROTEINUR:2,UROBILINOGEN:2,NITRITE:2,LEUKOCYTESUR:2 in the last 168 hours Lipid Panel    Component Value Date/Time   CHOL 245* 04/20/2012 0530   TRIG 187* 04/20/2012 0530   HDL 39* 04/20/2012 0530   CHOLHDL 6.3 04/20/2012 0530   VLDL 37 04/20/2012 0530   LDLCALC 169* 04/20/2012 0530   HgbA1C  Lab Results  Component Value Date   HGBA1C 8.5* 04/19/2012    Urine Drug Screen:     Component Value Date/Time   LABOPIA NONE DETECTED 04/19/2012 1939    COCAINSCRNUR NONE DETECTED 04/19/2012 1939   LABBENZ NONE DETECTED 04/19/2012 1939   AMPHETMU NONE DETECTED 04/19/2012 1939   THCU NONE DETECTED 04/19/2012 1939   LABBARB NONE DETECTED 04/19/2012 1939    Alcohol Level: No results found for this basename: ETH:2 in the last 168 hours   CT of the brain  04/19/2012  No evidence of acute intracranial abnormality.  Old lacunar infarcts in the left basal ganglia/thalamus.  Suspected small vessel ischemic changes.    MRI of the brain  04/20/2012 Limited study.  Negative for acute infarct.    MRA of the brain    2D Echocardiogram  EF 35% to 40%. Probable severe hypokinesis of the inferior myocardium. Probable hypokinesis of the anteroseptal myocardium. Doppler parameters are consistent with abnormal left ventricular relaxation (grade 1 diastolic dysfunction). - Ventricular septum: Septal motion showed paradox. Impressions  Carotid Doppler  No evidence of hemodynamically significant internal carotid artery stenosis. Vertebral artery flow is antegrade.   CXR    EKG  normal sinus rhythm.   Therapy Recommendations none needed  Physical Exam   Mental Status:  Able to follow commands without difficulty, no aphasia, no dysarthria.  Cranial Nerves:  II-Visual fields were normal.  III/IV/VI-Pupils were equal and reacted. Extraocular movements were full and conjugate.  V/VII-no facial numbness and no facial weakness.  VIII-normal.  X-normal speech and symmetrical palatal movement.  XII-midline tongue extension  Motor: 5/5 bilaterally with normal tone and bulk  Sensory: Normal throughout.  Deep Tendon Reflexes: 2+ and symmetric.  Plantars: Flexor bilaterally  Cerebellar: Normal finger-to-nose and heel-to-shin testing bilaterally.  Carotid auscultation: Normal  Ct Head Wo Contrast: Two lacunar infarcts in the left basal ganglia/thalamus    ASSESSMENT [name] is a 46 y.o. male presenting with acute onset speech difficulty.  Imaging confirms a no acute infarct. likely left brain TIA. Work up underway. On clopidogrel 75 mg orally every day prior to admission. Now on clopidogrel 75 mg orally every day for secondary stroke prevention. Patient with no resultant neuro symptoms.   Ischemic cardiomyopathy  CAD, MI, CABG 2007  Tobacco abuser  Diabetes, HgbA1c 8.5 Hyperlipidemia, LDL 169, not on statin PTA, allergic to pravastatin, goal LDL < 100 Hypertension   Hospital day # 2  TREATMENT/PLAN  Continue clopidogrel 75 mg orally every day for secondary stroke prevention.Will not change to Aggrenox since patient has drug coated cardiac stents.  Consider alternative statin given intolerance to pravastatin  Check TCD  Ongoing risk factor control   SHARON BIBY, MSN, RN, ANVP-BC, ANP-BC, GNP-BC [facility] Pager: [phone] 04/21/2012 11:54 AM  Scribe  for Dr. Delia Heady, Stroke Center Medical Director, who has personally reviewed chart, pertinent data, examined the patient and developed the plan of care. Pager:  [phone]

## 2012-04-21 NOTE — Progress Notes (Signed)
[facility] PROGRESS NOTE  Edwin White ZOX:[id number] DOB: [birth year] DOA: 04/19/2012 PCP: Sheila Oats, MD   History of present illness  Edwin White is an 46 y.o. male with a history of previous stroke, HTN, prior TIA, hyperlipidemia, type 2 diabetes mellitus, coronary artery disease with CABG and congestive heart failure, to the ER with sudden onset of aphasia. He denied any slurred speech, upper or lower extremity weakness. He has no HA, stiff neck, fever or chills. Patient has not been compliant with taking his medications including antiplatelet medication. He has not complained of chest pain today. He was last seen normal at 1630 today. CT scan of his head showed no left basal ganglia lacunar infarctions but no acute abnormality. He has improvement in his speech once he is in the ER. Because of his fairly rapid improvement thrombolytic therapy with TPA was not elected. After neurology consulted, hospitalist was asked to admit him for TIA work up.  Assessment/Plan: 1. TIA: MRI does not show acute infarct. Dysarthria and facial weakness seemed to have resolved. 2-D echo shows EF of 35-40% with hypokinesis. Carotid Dopplers negative. Plavix for secondary stroke prevention. Modify secondary risk factors. Counseled patient regarding compliance with medications. 2. Uncontrolled DM 2: Reasonable inpatient control. Continue glyburide and Sliding scale insulin. We'll add Lantus which apparently he was taking prior to admission. 3. Hypertension: Reasonable inpatient control. Continue Coreg, lisinopril and Procardia 4. Hypokalemia: Replete and follow BMP 5. Mild thrombocytopenia: Unclear etiology. 6. Hyperlipidemia: Patient is allergic to pravastatin. Continue fish oil. 7. Ischemic cardiomyopathy: In 07/12 echo showed LVEF of 40-45%. Continue is Plavix, lisinopril. Follow repeat echo. Not in overt heart failure. Continue Lasix. Repeat echo shows EF 35-40%-may be unchanged. Will discuss  with [facility] cardiology. 8. History of chronic systolic congestive heart failure: Compensated. Continue Lasix. 9. Noncompliance: Counseled regarding compliance with medical therapy. Patient verbalized understanding.  Code Status: Full Family Communication: Discussed directly with patient. Disposition Plan: Home ? 10/22   Consultants:  Neurology  Procedures:  MRI-MRA brain  CT head  Antibiotics:  None  HPI/Subjective: Patient says he is back to baseline. Resolved slurred speech and facial droop. Chronic left upper extremity numbness.  Objective: Filed Vitals:   04/20/12 2218 04/21/12 0124 04/21/12 0559 04/21/12 1000  BP:  123/72 118/70 133/82  Pulse: 72 68 78 71  Temp: 98.4 F (36.9 C) 97.7 F (36.5 C) 98.1 F (36.7 C) 97.9 F (36.6 C)  TempSrc: Oral Oral Oral Oral  Resp: 18 20 20 17   Height:      Weight:      SpO2: 99% 95% 99% 96%   No intake or output data in the 24 hours ending 04/21/12 1734 Filed Weights   04/19/12 1816 04/19/12 2121  Weight: 99.791 kg (220 lb) 98.748 kg (217 lb 11.2 oz)    Exam:   General exam: Comfortable.  Respiratory system: Clear. No increased work of breathing.  Cardiovascular system: S1 and S2, regular. No JVD, murmurs or gallops. Telemetry shows sinus rhythm with occasional PACs and PVCs.  Gastrointestinal system: Abdomen is nondistended, soft and nontender. Normal heart sounds heard.  Central nervous system: Alert and oriented. No cranial nerve deficits.  Extremities: Symmetric 5 x 5 power.  Data Reviewed: Basic Metabolic Panel:  Lab 04/21/12 8119 04/19/12 2130 04/19/12 1813 04/19/12 1810  NA 138 -- 141 139  K 3.0* -- 3.8 3.8  CL 100 -- 102 101  CO2 29 -- -- 26  GLUCOSE 167* -- 134* 139*  BUN 12 --  7 8  CREATININE 1.08 0.98 1.10 1.02  CALCIUM 9.2 -- -- 9.7  MG -- -- -- --  PHOS -- -- -- --   Liver Function Tests:  Lab 04/19/12 1810  AST 29  ALT 17  ALKPHOS 75  BILITOT 0.5  PROT 7.6  ALBUMIN 4.4   No  results found for this basename: LIPASE:5,AMYLASE:5 in the last 168 hours No results found for this basename: AMMONIA:5 in the last 168 hours CBC:  Lab 04/21/12 0500 04/19/12 2130 04/19/12 1813 04/19/12 1810  WBC 7.3 11.5* -- 11.2*  NEUTROABS -- -- -- 7.2  HGB 15.3 16.6 16.3 16.9  HCT 42.5 44.9 48.0 45.2  MCV 86.7 86.0 -- 86.3  PLT 131* 146* -- 155   Cardiac Enzymes:  Lab 04/19/12 1810  CKTOTAL --  CKMB --  CKMBINDEX --  TROPONINI <0.30   BNP (last 3 results) No results found for this basename: PROBNP:3 in the last 8760 hours CBG:  Lab 04/21/12 1614 04/21/12 1138 04/21/12 0649 04/21/12 0635 04/20/12 2055  GLUCAP 251* 178* 182* 176* 118*    No results found for this or any previous visit (from the past 240 hour(s)).   Studies: Ct Head Wo Contrast  04/19/2012  *RADIOLOGY REPORT*  Clinical Data: Right-sided aphasia  CT HEAD WITHOUT CONTRAST  Technique:  Contiguous axial images were obtained from the base of the skull through the vertex without contrast.  Comparison: [facility] CT head dated 12/24/2009, [facility] MRI brain dated 02/17/2008.  Findings: No evidence of parenchymal hemorrhage or extra-axial fluid collection. No mass lesion, mass effect, or midline shift.  No CT evidence of acute infarction.  Two lacunar infarcts in the left basal ganglia/thalamus (series 2/image 19), chronic.  Suspected subcortical white matter and periventricular small vessel ischemic changes, most prominently in the subcortical left frontal lobe (series 2/image 25).  Cerebral volume is age appropriate.  No ventriculomegaly.  Minimal fluid in the right maxillary sinus.  The visualized paranasal sinuses and mastoid air cells are essentially clear.  No evidence of calvarial fracture.  IMPRESSION: No evidence of acute intracranial abnormality.  Old lacunar infarcts in the left basal ganglia/thalamus.  Suspected small vessel ischemic changes.  These results were called by telephone on 04/19/2012 at 1820 hours to  Dr Roseanne Reno, who verbally acknowledged these results.   Original Report Authenticated By: Charline Bills, M.D.    Mr Angiogram Head Wo Contrast  04/21/2012  *RADIOLOGY REPORT*  Clinical Data: Stroke  MRA HEAD WITHOUT CONTRAST  Technique: Angiographic images of the Circle of Willis were obtained using MRA technique without intravenous contrast.  Comparison: MRI 04/20/2012.  CT 04/19/2012.  MRI 02/17/2008.  MR angiography 10/04/2005.  Findings: Both vertebral arteries are patent to the basilar.  No distal vertebral stenosis.  The basilar artery is patent to.  There is motion artifact at the distal basilar artery that simulates a dissection, but this is not representative of true pathology. Superior cerebellar and posterior cerebral vessels are patent. More distal branch vessels of the PCA use show some atherosclerotic narrowing and irregularity.  Both internal carotid arteries are patent into the brain.  There is narrowing in the siphon regions.  The supraclinoid internal carotid arteries are widely patent bilaterally.  On the left, the anterior and middle cerebral vessels are patent without proximal stenosis, aneurysm or vascular malformation.  More distal branch vessels show atherosclerotic irregularity.  On the right, the anterior cerebral artery is widely patent.  The middle cerebral artery is chronically occluded beyond an anterior  temporal branch.  This appearance was noted in 2007.  IMPRESSION: Similar appearance to the examination of 2007.  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  Distal vessel atherosclerotic irregularity diffusely.   Original Report Authenticated By: Thomasenia Sales, M.D.    Mr Brain Ltd W/o Cm  04/20/2012  *RADIOLOGY REPORT*  Clinical Data: Aphasia  MRI HEAD WITHOUT CONTRAST  Technique:  Multiplanar, multiecho pulse sequences of the brain and surrounding structures were obtained according to standard protocol without intravenous  contrast.  Comparison: CT 04/19/2012.  Findings: The study is limited to diffusion weighted imaging only. This sequence is of good quality and shows no acute infarct.  There is evidence of chronic ischemia in the left basal ganglia, as noted on the recent CT.  The patient refused further imaging.  IMPRESSION: Limited study.  Negative for acute infarct.   Original Report Authenticated By: Camelia Phenes, M.D.     Scheduled Meds:    . sodium chloride   Intravenous STAT  . carvedilol  25 mg Oral BID  . clopidogrel  75 mg Oral Daily  . furosemide  80 mg Oral Daily  . gabapentin  400 mg Oral BID  . glyBURIDE  5 mg Oral BID WC  . heparin  5,000 Units Subcutaneous Q8H  . insulin aspart  0-15 Units Subcutaneous TID WC  . insulin aspart  0-5 Units Subcutaneous QHS  . lisinopril  20 mg Oral Daily  . LORazepam  1 mg Intravenous Once  . lubiprostone  24 mcg Oral BID WC  . NIFEdipine  30 mg Oral Daily  . omega-3 acid ethyl esters  1 g Oral Daily  . pantoprazole  80 mg Oral Daily  . pneumococcal 23 valent vaccine  0.5 mL Intramuscular Tomorrow-1000  . polyethylene glycol  17 g Oral Daily   Continuous Infusions:    Principal Problem:  *TIA (transient ischemic attack) Active Problems:  DIAB W/O MENTION COMP TYPE II/UNS TYPE UNCNTRL  Pure hypercholesterolemia  NONDEPENDENT TOBACCO USE DISORDER  Old myocardial infarction  CHRONIC AIRWAY OBSTRUCTION NEC  PERS HX NONCOMPLIANCE W/MED TX PRS HAZARDS HLTH  Gastroparesis  Ischemic cardiomyopathy  Hypertension  Thrombocytopenia   Discussed with [place] cardiology who indicated that the current echo is of poor quality and recommended 2-D echo with contrast for better delineation. We'll order.  [facility]  [facility] Pager [phone]. If 8PM-8AM, please contact night-coverage at [email or web address], password [facility] 04/21/2012, 5:34 PM  LOS: 2 days

## 2012-04-22 LAB — BASIC METABOLIC PANEL
BUN: 9 mg/dL (ref 6–23)
CO2: 28 mEq/L (ref 19–32)
Calcium: 9.4 mg/dL (ref 8.4–10.5)
GFR calc non Af Amer: >90 mL/min (ref >90)
Glucose, Bld: 210 mg/dL — ABNORMAL HIGH (ref 70–99)

## 2012-04-22 LAB — CBC
MCH: 30.9 pg (ref 26.0–34.0)
MCHC: 35 g/dL (ref 30.0–36.0)
MCV: 88.1 fL (ref 78.0–100.0)
Platelets: 130 10*3/uL — ABNORMAL LOW (ref 150–400)

## 2012-04-22 LAB — GLUCOSE, CAPILLARY: Glucose-Capillary: 157 mg/dL — ABNORMAL HIGH (ref 70–99)

## 2012-04-22 MED ORDER — INSULIN GLARGINE 100 UNIT/ML ~~LOC~~ SOLN
15.0000 [IU] | Freq: Every day | SUBCUTANEOUS | Status: DC
  Administered 2012-04-22: 15 [IU] via SUBCUTANEOUS

## 2012-04-22 MED ORDER — ATORVASTATIN CALCIUM 10 MG PO TABS
10.0000 mg | ORAL_TABLET | Freq: Every day | ORAL | Status: DC
  Administered 2012-04-22: 10 mg via ORAL
  Filled 2012-04-22 (×2): qty 1

## 2012-04-22 MED ORDER — PERFLUTREN LIPID MICROSPHERE
1.0000 mL | INTRAVENOUS | Status: AC | PRN
  Administered 2012-04-22: 5 mL via INTRAVENOUS
  Filled 2012-04-22: qty 10

## 2012-04-22 MED ORDER — POTASSIUM CHLORIDE CRYS ER 20 MEQ PO TBCR
40.0000 meq | EXTENDED_RELEASE_TABLET | Freq: Once | ORAL | Status: AC
  Administered 2012-04-22: 40 meq via ORAL
  Filled 2012-04-22: qty 2

## 2012-04-22 MED ORDER — POTASSIUM CHLORIDE CRYS ER 20 MEQ PO TBCR
20.0000 meq | EXTENDED_RELEASE_TABLET | Freq: Every day | ORAL | Status: DC
  Administered 2012-04-23: 20 meq via ORAL
  Filled 2012-04-22: qty 1

## 2012-04-22 MED ORDER — POTASSIUM CHLORIDE CRYS ER 20 MEQ PO TBCR: 20.0000 meq | EXTENDED_RELEASE_TABLET | Freq: Every day | ORAL | Status: DC

## 2012-04-22 NOTE — Progress Notes (Signed)
Stroke Team Progress Note  HISTORY Edwin White is an 46 y.o. male with a history of previous stroke, recurrent TIAs, hypertension, hyperlipidemia, type 2 diabetes mellitus, coronary artery disease with CABG and congestive heart failure, presenting with acute onset of speech difficulty. There was no associated focal weakness nor numbness. Patient has not been compliant with taking his medications including antiplatelet medication. He was last seen normal at 1630 04/19/2012. CT scan of his head showed no left basal ganglia lacunar infarctions but no acute abnormality. NIH stroke score was 7, including in correct age and month which are thought to largely be due to expressive aphasia with paraphasic errors. Speech output improved including naming of objects as well as significant reduction in number paraphasic errors. Because of his fairly rapid improvement thrombolytic therapy with TPA was not elected.  SUBJECTIVE  He is feeling "much better". He states that he cannot take statins but cannot tell me why.   OBJECTIVE Most recent Vital Signs: Filed Vitals:   04/22/12 0603 04/22/12 1000 04/22/12 1055 04/22/12 1105  BP: 126/73 137/92 144/92 138/89  Pulse: 70 71 72 74  Temp: 97.7 F (36.5 C) 98.3 F (36.8 C)    TempSrc: Oral Oral    Resp: 20 18 16 18   Height:      Weight:      SpO2: 94% 99%     CBG (last 3)   Basename 04/22/12 0642 04/21/12 2214 04/21/12 1614  GLUCAP 223* 200* 251*    IV Fluid Intake:     MEDICATIONS     . carvedilol  25 mg Oral BID  . clopidogrel  75 mg Oral Daily  . furosemide  80 mg Oral Daily  . gabapentin  400 mg Oral BID  . glyBURIDE  5 mg Oral BID WC  . heparin  5,000 Units Subcutaneous Q8H  . insulin aspart  0-15 Units Subcutaneous TID WC  . insulin aspart  0-5 Units Subcutaneous QHS  . insulin glargine  10 Units Subcutaneous QHS  . lisinopril  20 mg Oral Daily  . lubiprostone  24 mcg Oral BID WC  . NIFEdipine  30 mg Oral Daily  . omega-3 acid ethyl  esters  1 g Oral Daily  . pantoprazole  80 mg Oral Daily  . polyethylene glycol  17 g Oral Daily  . potassium chloride  40 mEq Oral Once  . potassium chloride  40 mEq Oral Once   PRN:  acetaminophen, perflutren lipid microspheres (DEFINITY) IV suspension  Diet:  Carb Control thin liquids Activity:   Bathroom privileges with assistance DVT Prophylaxis:  Heparin 5000 units sq tid  CLINICALLY SIGNIFICANT STUDIES Basic Metabolic Panel:   Lab 04/22/12 0625 04/21/12 0500  NA 140 138  K 3.3* 3.0*  CL 100 100  CO2 28 29  GLUCOSE 210* 167*  BUN 9 12  CREATININE 0.86 1.08  CALCIUM 9.4 9.2  MG -- --  PHOS -- --   Liver Function Tests:   Lab 04/19/12 1810  AST 29  ALT 17  ALKPHOS 75  BILITOT 0.5  PROT 7.6  ALBUMIN 4.4   CBC:   Lab 04/22/12 0625 04/21/12 0500 04/19/12 1810  WBC 5.6 7.3 --  NEUTROABS -- -- 7.2  HGB 15.0 15.3 --  HCT 42.8 42.5 --  MCV 88.1 86.7 --  PLT 130* 131* --   Coagulation:   Lab 04/19/12 1810  LABPROT 14.2  INR 1.11   Cardiac Enzymes:   Lab 04/19/12 1810  CKTOTAL --  CKMB --  CKMBINDEX --  TROPONINI <0.30   Lipid Panel    Component Value Date/Time   CHOL 245* 04/20/2012 0530   TRIG 187* 04/20/2012 0530   HDL 39* 04/20/2012 0530   CHOLHDL 6.3 04/20/2012 0530   VLDL 37 04/20/2012 0530   LDLCALC 169* 04/20/2012 0530   HgbA1C  Lab Results  Component Value Date   HGBA1C 8.5* 04/19/2012    Urine Drug Screen:     Component Value Date/Time   LABOPIA NONE DETECTED 04/19/2012 1939   COCAINSCRNUR NONE DETECTED 04/19/2012 1939   LABBENZ NONE DETECTED 04/19/2012 1939   AMPHETMU NONE DETECTED 04/19/2012 1939   THCU NONE DETECTED 04/19/2012 1939   LABBARB NONE DETECTED 04/19/2012 1939    Alcohol Level: No results found for this basename: ETH:2 in the last 168 hours   CT of the brain  04/19/2012  No evidence of acute intracranial abnormality.  Old lacunar infarcts in the left basal ganglia/thalamus.  Suspected small vessel ischemic  changes.    MRI of the brain  04/20/2012 Limited study.  Negative for acute infarct.    MRA of the brain    2D Echocardiogram  EF 35% to 40%. Probable severe hypokinesis of the inferior myocardium. Probable hypokinesis of the anteroseptal myocardium. Doppler parameters are consistent with abnormal left ventricular relaxation (grade 1 diastolic dysfunction). - Ventricular septum: Septal motion showed paradox.   Carotid Doppler  No evidence of hemodynamically significant internal carotid artery stenosis. Vertebral artery flow is antegrade.   CXR    TCD  EKG  normal sinus rhythm.   Therapy Recommendations none needed  Physical Exam   Mental Status:  Able to follow commands without difficulty, no aphasia, no dysarthria.  Cranial Nerves:  II-Visual fields were normal.  III/IV/VI-Pupils were equal and reacted. Extraocular movements were full and conjugate.  V/VII-no facial numbness and no facial weakness.  VIII-normal.  X-normal speech and symmetrical palatal movement.  XII-midline tongue extension  Motor: 5/5 bilaterally with normal tone and bulk  Sensory: Normal throughout.  Deep Tendon Reflexes: 2+ and symmetric.  Plantars: Flexor bilaterally  Cerebellar: Normal finger-to-nose and heel-to-shin testing bilaterally.  Carotid auscultation: Normal  Ct Head Wo Contrast: Two lacunar infarcts in the left basal ganglia/thalamus    ASSESSMENT [name] is a 46 y.o. male presenting with acute onset speech difficulty. Imaging confirms a no acute infarct. likely left brain TIA. Work up underway. On clopidogrel 75 mg orally every day prior to admission. Now on clopidogrel 75 mg orally every day for secondary stroke prevention. Patient with no resultant neuro symptoms.   Ischemic cardiomyopathy  CAD, MI, CABG 2007  Tobacco abuser  Diabetes, HgbA1c 8.5 Hyperlipidemia, LDL 169, not on statin PTA, allergic to pravastatin, goal LDL < 100 Hypertension  Hypokalemia  Hospital  day # 3  TREATMENT/PLAN  Continue clopidogrel 75 mg orally every day for secondary stroke prevention.Will not change to Aggrenox since patient has drug coated cardiac stents.  Consider alternative statin given intolerance to pravastatin, patient on fish oil product at home.  Check TCD  Ongoing risk factor control  Tobacco cessation counselling  Replace potassium. Recheck bmp and magnesium level in am.   Guy Franco, PAC,  MBA, MHA [facility] Pager: [phone] 04/22/2012 2:28 PM  Scribe for Dr. Delia Heady, Stroke Center Medical Director. He has personally reviewed chart, pertinent data, examined the patient and developed the plan of care. Pager:  [phone]

## 2012-04-22 NOTE — Consult Note (Signed)
Cardiology Consult Note   Patient ID: Edwin White MRN: [id number], DOB/AGE: [birth year]   Admit date: 04/19/2012 Date of Consult: 04/22/2012  Primary Physician: Sheila Oats, MD Primary Cardiologist: [facility] cardiology- seen by Dr. Earnestine Leys in the past  Reason for consult: evaluation/management of newly reduced EF and diffuse HK  HPI: Edwin White is a 46yo male with PMHx significant for CAD (s/p prior PCI & CABG 2007), ischemic cardiomyopathy, hx of apical thrombus (anticoagulation d/ced), tobacco abuse, hx of CVA/TIA, DM, HL, HTN, hx of medication noncompliance who was admitted for TIA.  He presented to the ED on 10/20 with sudden onset of aphasia. He was noted to not be compliant with his medications including antiplatelets. He denied chest pain at that time. Head CT revealed no acute infarction. His speech improved in the ED and medicine admitted the patient for TIA.  Head MIR neg for acute infarct. Head MRA unchanged from 2007 and showed a chronically occluded R MCA. Doppler carotids- no sig carotid stenosis, antegrade vertebral flow. 10/20 echo revealed LVEF 35-40%, grade 1 dd, mild LV dilatation with mild concentric hypertrophy, severe inf HK, anteroseptal HK. This was deemed technically difficult and repeat echo with contrast was performed revealing LVEF 25-30%, diffuse HK, no evidence of thrombus, severe diffuse, multiple WMAs with an impression of severe CAD c/w ICM. Trop-I on admission WNL. EKG with PVCs and new lateral ST depressions.   He had followed with Dr. Earnestine Leys on 02/13 and was noted to be stable with NYHA II symptoms and improvement of EF by serial measurement to ~35%. Repeat stress test was recommended prior to referral for ICD consideration. No cath's since 2007. No stress tests.   Denies chest pain, DOE, SOB, PND, orthopnea, weight gain, LE edema or palpitations. He reports no limitation in functional capacity. Able to climb 2-3 flights of stairs without becoming short  of breath. He states that he has not been taking his medications 2/2 financial difficulties (cardiac-wise this includes ACEi/BB/Lasix/Plavix), however continues to smoke ~ 1PPD. States he had slurred speech the day of admission, but denies additional neurological deficits.   Problem List: Past Medical History  Diagnosis Date  . CHF (congestive heart failure)     No recurrent admissions for heart failure.  . Ischemic cardiomyopathy     Ejection fraction improved from 15%-20% to 40%-45% in 2012 by echocardiogram  . Coronary artery disease     Status post coronary bypass grafting in 2007  . History of medication noncompliance   . Tobacco user   . Moderate mitral regurgitation by prior echocardiogram   . Type 2 diabetes mellitus   . Post-infarction apical thrombus     Anticoagulation discontinued  . Crescendo transient ischemic attacks   . Hyperlipidemia   . Heart attack September 13, 2004    Followed by bypass surgery  . Gastroparesis     Gastric emptying study scheduled  . Functional bowel disorder     Bloating and abdominal cramping as well as constipation requiring lubiprostone  . Hypertension     Poorly controlled    Past Surgical History  Procedure Date  . Coronary artery bypass graft   . Cardiac catheterization      Allergies:  Allergies  Allergen Reactions  . Pravastatin Other (See Comments)    PT CANNOT NOT WALK OR MOVE.    Home Medications: Prior to Admission medications   Medication Sig Start Date End Date Taking? Authorizing Provider  carvedilol (COREG) 25 MG tablet Take 1 tablet (  25 mg total) by mouth 2 (two) times daily. 08/06/11 08/05/12 Yes June Leap, MD  clopidogrel (PLAVIX) 75 MG tablet Take 75 mg by mouth daily.     Yes Historical Provider, MD  fish oil-omega-3 fatty acids 1000 MG capsule Take 1 g by mouth 2 (two) times daily.    Yes Historical Provider, MD  furosemide (LASIX) 80 MG tablet Take 80 mg by mouth daily.     Yes Historical Provider, MD    gabapentin (NEURONTIN) 400 MG capsule Take 400 mg by mouth 2 (two) times daily.   Yes Historical Provider, MD  glyBURIDE (DIABETA) 5 MG tablet Take 5 mg by mouth 2 (two) times daily with a meal.    Yes Historical Provider, MD  insulin glargine (LANTUS SOLOSTAR) 100 UNIT/ML injection Inject into the skin as directed.    Yes Historical Provider, MD  lisinopril (PRINIVIL,ZESTRIL) 20 MG tablet Take 20 mg by mouth daily.     Yes Historical Provider, MD  lubiprostone (AMITIZA) 24 MCG capsule Take 24 mcg by mouth 2 (two) times daily with a meal.   Yes Historical Provider, MD  NIFEdipine (PROCARDIA-XL) 30 MG (OSM) 24 hr tablet Take 30 mg by mouth daily.     Yes Historical Provider, MD  omeprazole (PRILOSEC) 40 MG capsule Take 40 mg by mouth daily.   Yes Historical Provider, MD  polyethylene glycol (MIRALAX / GLYCOLAX) packet Take 17 g by mouth daily.     Yes Historical Provider, MD  potassium chloride SA (K-DUR,KLOR-CON) 20 MEQ tablet Take 20 mEq by mouth daily.     Yes Historical Provider, MD  hyoscyamine (LEVSIN SL) 0.125 MG SL tablet Place 1 tablet (0.125 mg total) under the tongue every 6 (six) hours as needed for cramping. 10/08/11 10/18/11  June Leap, MD  hyoscyamine (LEVSIN, ANASPAZ) 0.125 MG tablet Take 1 tablet (0.125 mg total) by mouth every 6 (six) hours as needed for cramping. Or abdominal pain. 12/05/11 12/15/11  June Leap, MD    Inpatient Medications:     . carvedilol  25 mg Oral BID  . clopidogrel  75 mg Oral Daily  . furosemide  80 mg Oral Daily  . gabapentin  400 mg Oral BID  . glyBURIDE  5 mg Oral BID WC  . heparin  5,000 Units Subcutaneous Q8H  . insulin aspart  0-15 Units Subcutaneous TID WC  . insulin aspart  0-5 Units Subcutaneous QHS  . insulin glargine  15 Units Subcutaneous QHS  . lisinopril  20 mg Oral Daily  . lubiprostone  24 mcg Oral BID WC  . NIFEdipine  30 mg Oral Daily  . omega-3 acid ethyl esters  1 g Oral Daily  . pantoprazole  80 mg Oral Daily  .  polyethylene glycol  17 g Oral Daily  . potassium chloride  20 mEq Oral Daily  . potassium chloride  40 mEq Oral Once  . DISCONTD: insulin glargine  10 Units Subcutaneous QHS  . DISCONTD: potassium chloride  20 mEq Oral Daily   Prescriptions prior to admission  Medication Sig Dispense Refill  . carvedilol (COREG) 25 MG tablet Take 1 tablet (25 mg total) by mouth 2 (two) times daily.  60 tablet  6  . clopidogrel (PLAVIX) 75 MG tablet Take 75 mg by mouth daily.        . fish oil-omega-3 fatty acids 1000 MG capsule Take 1 g by mouth 2 (two) times daily.       Marland Kitchen  furosemide (LASIX) 80 MG tablet Take 80 mg by mouth daily.        Marland Kitchen gabapentin (NEURONTIN) 400 MG capsule Take 400 mg by mouth 2 (two) times daily.      Marland Kitchen glyBURIDE (DIABETA) 5 MG tablet Take 5 mg by mouth 2 (two) times daily with a meal.       . insulin glargine (LANTUS SOLOSTAR) 100 UNIT/ML injection Inject into the skin as directed.       Marland Kitchen lisinopril (PRINIVIL,ZESTRIL) 20 MG tablet Take 20 mg by mouth daily.        Marland Kitchen lubiprostone (AMITIZA) 24 MCG capsule Take 24 mcg by mouth 2 (two) times daily with a meal.      . NIFEdipine (PROCARDIA-XL) 30 MG (OSM) 24 hr tablet Take 30 mg by mouth daily.        Marland Kitchen omeprazole (PRILOSEC) 40 MG capsule Take 40 mg by mouth daily.      . polyethylene glycol (MIRALAX / GLYCOLAX) packet Take 17 g by mouth daily.        . potassium chloride SA (K-DUR,KLOR-CON) 20 MEQ tablet Take 20 mEq by mouth daily.        . hyoscyamine (LEVSIN SL) 0.125 MG SL tablet Place 1 tablet (0.125 mg total) under the tongue every 6 (six) hours as needed for cramping.  30 tablet  0  . hyoscyamine (LEVSIN, ANASPAZ) 0.125 MG tablet Take 1 tablet (0.125 mg total) by mouth every 6 (six) hours as needed for cramping. Or abdominal pain.  30 tablet  1    Family History  Problem Relation Age of Onset  . Coronary artery disease Father      History   Social History  . Marital Status: Legally Separated    Spouse Name: N/A    Number  of Children: N/A  . Years of Education: N/A   Occupational History  . Not on file.   Social History Main Topics  . Smoking status: Current Every Day Smoker -- 0.5 packs/day for 34 years    Types: Cigarettes  . Smokeless tobacco: Never Used  . Alcohol Use: No     quit years ago  . Drug Use: Not on file  . Sexually Active: Not on file   Other Topics Concern  . Not on file   Social History Narrative   Disabled.      Review of Systems: General: negative for chills, fever, night sweats or weight changes.  Cardiovascular: negative for chest pain, dyspnea on exertion, edema, orthopnea, palpitations, paroxysmal nocturnal dyspnea or shortness of breath Dermatological: negative for rash Respiratory: negative for cough or wheezing Urologic: negative for hematuria Abdominal:  negative for nausea, vomiting, diarrhea, bright red blood per rectum, melena, or hematemesis Neurologic: positive for aphasia, negative for visual changes, syncope, or dizziness All other systems reviewed and are otherwise negative except as noted above.  Physical Exam: Blood pressure 127/73, pulse 80, temperature 98.5 F (36.9 C), temperature source Oral, resp. rate 18, height 6' (1.829 m), weight 98.748 kg (217 lb 11.2 oz), SpO2 100.00%.    General: Well developed, well nourished, in no acute distress. Head: Normocephalic, atraumatic, sclera non-icteric, no xanthomas, nares are without discharge.  Neck: Negative for carotid bruits. JVD not elevated. Lungs: Clear bilaterally to auscultation without wheezes, rales, or rhonchi. Breathing is unlabored. Heart: distant HS, RRR with S1 S2. No murmurs, rubs, or gallops appreciated. Abdomen: Soft, non-tender, non-distended with normoactive bowel sounds. No hepatomegaly. No rebound/guarding. No obvious abdominal masses. Msk:  Strength and tone appears normal for age. Extremities: No clubbing, cyanosis or edema.  Distal pedal pulses are 2+ and equal bilaterally. Neuro:  Alert and oriented X 3. Moves all extremities spontaneously. Psych:  Responds to questions appropriately with a normal affect.  Labs: Recent Labs  Basename 04/22/12 0625 04/21/12 0500   WBC 5.6 7.3   HGB 15.0 15.3   HCT 42.8 42.5   MCV 88.1 86.7   PLT 130* 131*   Lab 04/22/12 0625 04/21/12 0500 04/19/12 2130 04/19/12 1813 04/19/12 1810  NA 140 138 -- 141 --  K 3.3* 3.0* -- 3.8 --  CL 100 100 -- 102 --  CO2 28 29 -- -- 26  BUN 9 12 -- 7 --  CREATININE 0.86 1.08 0.98 -- --  CALCIUM 9.4 9.2 -- -- 9.7  PROT -- -- -- -- 7.6  BILITOT -- -- -- -- 0.5  ALKPHOS -- -- -- -- 75  ALT -- -- -- -- 17  AST -- -- -- -- 29  AMYLASE -- -- -- -- --  LIPASE -- -- -- -- --  GLUCOSE 210* 167* -- 134* --   Recent Labs  Basename 04/19/12 2130   HGBA1C 8.5*   Recent Labs  Basename 04/20/12 0530   CHOL 245*   HDL 39*   LDLCALC 169*   TRIG 187*   CHOLHDL 6.3   LDLDIRECT --   Radiology/Studies: Ct Head Wo Contrast  04/19/2012  *RADIOLOGY REPORT*  Clinical Data: Right-sided aphasia  CT HEAD WITHOUT CONTRAST  Technique:  Contiguous axial images were obtained from the base of the skull through the vertex without contrast.  Comparison: [facility] CT head dated 12/24/2009, [facility] MRI brain dated 02/17/2008.  Findings: No evidence of parenchymal hemorrhage or extra-axial fluid collection. No mass lesion, mass effect, or midline shift.  No CT evidence of acute infarction.  Two lacunar infarcts in the left basal ganglia/thalamus (series 2/image 19), chronic.  Suspected subcortical white matter and periventricular small vessel ischemic changes, most prominently in the subcortical left frontal lobe (series 2/image 25).  Cerebral volume is age appropriate.  No ventriculomegaly.  Minimal fluid in the right maxillary sinus.  The visualized paranasal sinuses and mastoid air cells are essentially clear.  No evidence of calvarial fracture.  IMPRESSION: No evidence of acute intracranial abnormality.  Old lacunar  infarcts in the left basal ganglia/thalamus.  Suspected small vessel ischemic changes.  These results were called by telephone on 04/19/2012 at 1820 hours to Dr Roseanne Reno, who verbally acknowledged these results.   Original Report Authenticated By: Charline Bills, M.D.    Mr Angiogram Head Wo Contrast  04/21/2012  *RADIOLOGY REPORT*  Clinical Data: Stroke  MRA HEAD WITHOUT CONTRAST  Technique: Angiographic images of the Circle of Willis were obtained using MRA technique without intravenous contrast.  Comparison: MRI 04/20/2012.  CT 04/19/2012.  MRI 02/17/2008.  MR angiography 10/04/2005.  Findings: Both vertebral arteries are patent to the basilar.  No distal vertebral stenosis.  The basilar artery is patent to.  There is motion artifact at the distal basilar artery that simulates a dissection, but this is not representative of true pathology. Superior cerebellar and posterior cerebral vessels are patent. More distal branch vessels of the PCA use show some atherosclerotic narrowing and irregularity.  Both internal carotid arteries are patent into the brain.  There is narrowing in the siphon regions.  The supraclinoid internal carotid arteries are widely patent bilaterally.  On the left, the anterior and middle cerebral vessels are  patent without proximal stenosis, aneurysm or vascular malformation.  More distal branch vessels show atherosclerotic irregularity.  On the right, the anterior cerebral artery is widely patent.  The middle cerebral artery is chronically occluded beyond an anterior temporal branch.  This appearance was noted in 2007.  IMPRESSION: Similar appearance to the examination of 2007.  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  Distal vessel atherosclerotic irregularity diffusely.   Original Report Authenticated By: Thomasenia Sales, M.D.    Mr Brain Ltd W/o Cm  04/20/2012  *RADIOLOGY REPORT*  Clinical Data: Aphasia  MRI HEAD WITHOUT CONTRAST   Technique:  Multiplanar, multiecho pulse sequences of the brain and surrounding structures were obtained according to standard protocol without intravenous contrast.  Comparison: CT 04/19/2012.  Findings: The study is limited to diffusion weighted imaging only. This sequence is of good quality and shows no acute infarct.  There is evidence of chronic ischemia in the left basal ganglia, as noted on the recent CT.  The patient refused further imaging.  IMPRESSION: Limited study.  Negative for acute infarct.   Original Report Authenticated By: Camelia Phenes, M.D.    EKG: NSR, 87 bpm, LAD, new down sloping ST dep V5-V6, PVC  ASSESSMENT:   1. TIA 2. CAD  3. Ischemic cardiomyopathy :  Most recent echo with contrast suggests ejection fraction in the 30% range.  4. Type 2 DM 5. HTN 6. HL 7. Medication noncompliance 8. Hx CVA/TIA 9. Tobacco abuse  DISCUSSION/PLAN:  The patient has noted decreased EF and multiple WMAs on contrasted echo c/w CAD and ICM. He reports medication noncompliance and continued tobacco abuse. The main issue seems to be this and poor insight into his chronic conditions. ACEi and BB therapy which may have been improving EF over time are now withdrawn. ASA/Plavix have been absent as well resulting in lack of antiplatelet protection with underlying CAD. He is largely asymptomatic from an ischemic, arrhythmic and heart failure standpoint. Recommend continuing prior cardiac regimen. We have stressed medication compliance and smoking cessation. Strict control of his cardiac RFs will help control CAD and hence ICM progression. Case management consultation would be warranted given his financial difficulties, but again, should he stop smoking, he may have the ability to afford his meds. Given lack of symptoms, will hold off on ischemic work-up/intervention.    Signed, R. Hurman Horn, PA-C 04/22/2012, 6:59 PM I have seen the patient and examined him. I have reviewed the data with  Edwin White. I have reviewed the full note above and adjusted some of the findings. The patient has severe left ventricular dysfunction. It is possible that his LV function is worse than the last echo. We do not know when this change may have occurred. He has been very noncompliant with the medications for multiple reasons. The plan from the cardiac viewpoint at this time will be to get him back on his cardiac meds. We should make all attempts to see if the system can help him obtain his medications. So far we have not proven that his cardiac status is the bases of his TIA. His rhythm has been normal sinus.

## 2012-04-22 NOTE — Progress Notes (Signed)
[facility] PROGRESS NOTE  ZYQUEZ SCARFONE ZOX:[id number] DOB: Sep 06, 1965 DOA: 04/19/2012 PCP: Sheila Oats, MD   History of present illness  Edwin White is an 46 y.o. male with a history of previous stroke, HTN, prior TIA, hyperlipidemia, type 2 diabetes mellitus, coronary artery disease with CABG and congestive heart failure, to the ER with sudden onset of aphasia. He denied any slurred speech, upper or lower extremity weakness. He has no HA, stiff neck, fever or chills. Patient has not been compliant with taking his medications including antiplatelet medication. He has not complained of chest pain today. He was last seen normal at 1630 today. CT scan of his head showed no left basal ganglia lacunar infarctions but no acute abnormality. He has improvement in his speech once he is in the ER. Because of his fairly rapid improvement thrombolytic therapy with TPA was not elected. After neurology consulted, hospitalist was asked to admit him for TIA work up.  Assessment/Plan: 1. TIA: MRI does not show acute infarct. Dysarthria and facial weakness have resolved. 2-D echo with contrast shows EF of 25-30% and diffuse hypokinesis (cardiology consulted). Carotid Dopplers negative. Plavix for secondary stroke prevention. Modify secondary risk factors. Counseled patient regarding compliance with medications. 2. Uncontrolled DM 2: Reasonable inpatient control. Continue glyburide and Sliding scale insulin. Increased Lantus.  3. Hypertension: Reasonable inpatient control. Continue Coreg, lisinopril and Procardia 4. Hypokalemia: Replete and follow BMP 5. Mild thrombocytopenia: Unclear etiology. 6. Hyperlipidemia: Patient is allergic to pravastatin. Continue fish oil. Will try low dose Crestor. 7. Ischemic cardiomyopathy: In 07/12 echo showed LVEF of 40-45%. Continue is Plavix, lisinopril. Follow repeat echo. Not in overt heart failure. Continue Lasix. Repeat echo with contrast is worse with EF of  25-30% and diffuse hypokinesis. [facility] cardiology consulted. 8. History of chronic systolic congestive heart failure: Compensated. Continue Lasix. 9. Noncompliance: Counseled regarding compliance with medical therapy. Patient verbalized understanding.  Code Status: Full Family Communication: Discussed directly with patient. Disposition Plan: Home, pending cardiology evaluation.   Consultants:  Neurology  [place] [facility]  Procedures:  MRI-MRA brain  CT head  2-D echo  Antibiotics:  None  HPI/Subjective: Denies complaints  Objective: Filed Vitals:   04/22/12 1000 04/22/12 1055 04/22/12 1105 04/22/12 1400  BP: 137/92 144/92 138/89 133/73  Pulse: 71 72 74 75  Temp: 98.3 F (36.8 C)   98 F (36.7 C)  TempSrc: Oral   Oral  Resp: 18 16 18 18   Height:      Weight:      SpO2: 99%   99%    Intake/Output Summary (Last 24 hours) at 04/22/12 1811 Last data filed at 04/22/12 1000  Gross per 24 hour  Intake    500 ml  Output      0 ml  Net    500 ml   Filed Weights   04/19/12 1816 04/19/12 2121  Weight: 99.791 kg (220 lb) 98.748 kg (217 lb 11.2 oz)    Exam:   General exam: Comfortable.  Respiratory system: Clear. No increased work of breathing.  Cardiovascular system: S1 and S2, regular. No JVD, murmurs or gallops. Telemetry shows sinus rhythm with occasional PACs and PVCs.  Gastrointestinal system: Abdomen is nondistended, soft and nontender. Normal heart sounds heard.  Central nervous system: Alert and oriented. No cranial nerve deficits.  Extremities: Symmetric 5 x 5 power.  Data Reviewed: Basic Metabolic Panel:  Lab 04/22/12 8119 04/21/12 0500 04/19/12 2130 04/19/12 1813 04/19/12 1810  NA 140 138 -- 141 139  K  3.3* 3.0* -- 3.8 3.8  CL 100 100 -- 102 101  CO2 28 29 -- -- 26  GLUCOSE 210* 167* -- 134* 139*  BUN 9 12 -- 7 8  CREATININE 0.86 1.08 0.98 1.10 1.02  CALCIUM 9.4 9.2 -- -- 9.7  MG -- -- -- -- --  PHOS -- -- -- -- --   Liver  Function Tests:  Lab 04/19/12 1810  AST 29  ALT 17  ALKPHOS 75  BILITOT 0.5  PROT 7.6  ALBUMIN 4.4   No results found for this basename: LIPASE:5,AMYLASE:5 in the last 168 hours No results found for this basename: AMMONIA:5 in the last 168 hours CBC:  Lab 04/22/12 0625 04/21/12 0500 04/19/12 2130 04/19/12 1813 04/19/12 1810  WBC 5.6 7.3 11.5* -- 11.2*  NEUTROABS -- -- -- -- 7.2  HGB 15.0 15.3 16.6 16.3 16.9  HCT 42.8 42.5 44.9 48.0 45.2  MCV 88.1 86.7 86.0 -- 86.3  PLT 130* 131* 146* -- 155   Cardiac Enzymes:  Lab 04/19/12 1810  CKTOTAL --  CKMB --  CKMBINDEX --  TROPONINI <0.30   BNP (last 3 results) No results found for this basename: PROBNP:3 in the last 8760 hours CBG:  Lab 04/22/12 1638 04/22/12 0642 04/21/12 2214 04/21/12 1614 04/21/12 1138  GLUCAP 181* 223* 200* 251* 178*    No results found for this or any previous visit (from the past 240 hour(s)).   Studies: Mr Angiogram Head Wo Contrast  04/21/2012  *RADIOLOGY REPORT*  Clinical Data: Stroke  MRA HEAD WITHOUT CONTRAST  Technique: Angiographic images of the Circle of Willis were obtained using MRA technique without intravenous contrast.  Comparison: MRI 04/20/2012.  CT 04/19/2012.  MRI 02/17/2008.  MR angiography 10/04/2005.  Findings: Both vertebral arteries are patent to the basilar.  No distal vertebral stenosis.  The basilar artery is patent to.  There is motion artifact at the distal basilar artery that simulates a dissection, but this is not representative of true pathology. Superior cerebellar and posterior cerebral vessels are patent. More distal branch vessels of the PCA use show some atherosclerotic narrowing and irregularity.  Both internal carotid arteries are patent into the brain.  There is narrowing in the siphon regions.  The supraclinoid internal carotid arteries are widely patent bilaterally.  On the left, the anterior and middle cerebral vessels are patent without proximal stenosis, aneurysm or  vascular malformation.  More distal branch vessels show atherosclerotic irregularity.  On the right, the anterior cerebral artery is widely patent.  The middle cerebral artery is chronically occluded beyond an anterior temporal branch.  This appearance was noted in 2007.  IMPRESSION: Similar appearance to the examination of 2007.  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  Distal vessel atherosclerotic irregularity diffusely.   Original Report Authenticated By: Thomasenia Sales, M.D.     Scheduled Meds:    . carvedilol  25 mg Oral BID  . clopidogrel  75 mg Oral Daily  . furosemide  80 mg Oral Daily  . gabapentin  400 mg Oral BID  . glyBURIDE  5 mg Oral BID WC  . heparin  5,000 Units Subcutaneous Q8H  . insulin aspart  0-15 Units Subcutaneous TID WC  . insulin aspart  0-5 Units Subcutaneous QHS  . insulin glargine  10 Units Subcutaneous QHS  . lisinopril  20 mg Oral Daily  . lubiprostone  24 mcg Oral BID WC  . NIFEdipine  30 mg Oral Daily  .  omega-3 acid ethyl esters  1 g Oral Daily  . pantoprazole  80 mg Oral Daily  . polyethylene glycol  17 g Oral Daily  . potassium chloride  20 mEq Oral Daily  . potassium chloride  40 mEq Oral Once  . potassium chloride  40 mEq Oral Once  . 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 If 8PM-8AM, please contact night-coverage at [email or web address], password [facility] 04/22/2012, 6:11 PM  LOS: 3 days

## 2012-04-22 NOTE — Progress Notes (Signed)
  Echocardiogram 2D Echocardiogram has been performed.  [place] 04/22/2012, 2:44 PM

## 2012-04-22 NOTE — Progress Notes (Signed)
   CARE MANAGEMENT NOTE 04/22/2012  Patient:  HAWKINS, SEAMAN   Account Number:  [id number]  Date Initiated:  04/22/2012  Documentation initiated by:  [facility]  Subjective/Objective Assessment:   TIA/CVA     Action/Plan:   sisterCorrie Dandy [phone]   Anticipated DC Date:  04/22/2012   Anticipated DC Plan:  HOME/SELF CARE      DC Planning Services  CM consult      Choice offered to / List presented to:             Status of service:  In process, will continue to follow Medicare Important Message given?   (If response is "NO", the following Medicare IM given date fields will be blank) Date Medicare IM given:   Date Additional Medicare IM given:    Discharge Disposition:    Per UR Regulation:    If discussed at Long Length of Stay Meetings, dates discussed:    Comments:  04/21/2012 1200 NCM spoke to pt and states he lives at home alone. Pt independent prior to admission. NCM will continue to follow up until d/c for any needs. Isidoro Donning RN CCM Case Mgmt phone [phone]

## 2012-04-22 NOTE — Progress Notes (Signed)
*  PRELIMINARY RESULTS* Vascular Ultrasound Transcranial Doppler has been completed.    04/22/2012 12:19 PM Gertie Fey, RDMS, RDCS

## 2012-04-22 NOTE — Progress Notes (Addendum)
MEDICATION RELATED CONSULT NOTE - FOLLOW UP   Pharmacy Consult for  Statin Alternative Indication:  Pravachol allergy  Allergies  Allergen Reactions  . Pravastatin Other (See Comments)    PT CANNOT NOT WALK OR MOVE.   Patient Measurements: Height: 6' (182.9 cm) Weight: 217 lb 11.2 oz (98.748 kg) IBW/kg (Calculated) : 77.6   Assessment: 46yo male admitted with aphasia.  He has previous history of stroke and TIA.  He has other co-morbidities as well as health care non-compliance.  He states he "cannot move or walk" when he takes Pravachol and we have been consulted regarding an alternative statin.  There is risk for an adverse reaction with any of the statins, however, sometimes patients can tolerate one when they can't tolerate another.  Goal of Therapy:  Tolerance of statin therapy without adverse event.  Plan:   - Will start low dose Lipitor at 10mg  daily.    - F/u to see if he tolerates this statin   Nadara Mustard, PharmD., MS Clinical Pharmacist Pager:  [phone] Thank you for allowing pharmacy to be part of this patients care team.  04/22/2012,8:06 PM

## 2012-04-23 ENCOUNTER — Telehealth: Payer: Self-pay | Admitting: Cardiology

## 2012-04-23 LAB — BASIC METABOLIC PANEL
BUN: 10 mg/dL (ref 6–23)
CO2: 30 mEq/L (ref 19–32)
Calcium: 9.4 mg/dL (ref 8.4–10.5)
Creatinine, Ser: 0.93 mg/dL (ref 0.50–1.35)
GFR calc non Af Amer: >90 mL/min (ref >90)
Glucose, Bld: 190 mg/dL — ABNORMAL HIGH (ref 70–99)
Sodium: 140 mEq/L (ref 135–145)

## 2012-04-23 LAB — GLUCOSE, CAPILLARY

## 2012-04-23 MED ORDER — CARVEDILOL 25 MG PO TABS: 25.0000 mg | ORAL_TABLET | Freq: Two times a day (BID) | ORAL | Status: DC

## 2012-04-23 MED ORDER — POTASSIUM CHLORIDE CRYS ER 20 MEQ PO TBCR: 20.0000 meq | EXTENDED_RELEASE_TABLET | Freq: Every day | ORAL | Status: DC

## 2012-04-23 MED ORDER — LISINOPRIL 20 MG PO TABS: 20.0000 mg | ORAL_TABLET | Freq: Every day | ORAL | Status: DC

## 2012-04-23 MED ORDER — NIFEDIPINE 30 MG (OSM) PO TB24: 30.0000 mg | ORAL_TABLET | Freq: Every day | ORAL | Status: DC

## 2012-04-23 MED ORDER — CLOPIDOGREL BISULFATE 75 MG PO TABS: 75.0000 mg | ORAL_TABLET | Freq: Every day | ORAL | Status: DC

## 2012-04-23 MED ORDER — FUROSEMIDE 80 MG PO TABS: 80.0000 mg | ORAL_TABLET | Freq: Every day | ORAL | Status: DC

## 2012-04-23 MED ORDER — ATORVASTATIN CALCIUM 10 MG PO TABS: 10.0000 mg | ORAL_TABLET | Freq: Every day | ORAL | Status: DC

## 2012-04-23 MED ORDER — INSULIN GLARGINE 100 UNIT/ML ~~LOC~~ SOLN: 20.0000 [IU] | SUBCUTANEOUS | Status: DC

## 2012-04-23 NOTE — Progress Notes (Signed)
Stroke Team Progress Note  HISTORY Edwin White is an 46 y.o. male with a history of previous stroke, recurrent TIAs, hypertension, hyperlipidemia, type 2 diabetes mellitus, coronary artery disease with CABG and congestive heart failure, presenting with acute onset of speech difficulty. There was no associated focal weakness nor numbness. Patient has not been compliant with taking his medications including antiplatelet medication. He was last seen normal at 1630 04/19/2012. CT scan of his head showed no left basal ganglia lacunar infarctions but no acute abnormality. NIH stroke score was 7, including in correct age and month which are thought to largely be due to expressive aphasia with paraphasic errors. Speech output improved including naming of objects as well as significant reduction in number paraphasic errors. Because of his fairly rapid improvement thrombolytic therapy with TPA was not elected.  SUBJECTIVE  He is feeling "much better". He states that he cannot take statins but cannot tell me why.   OBJECTIVE Most recent Vital Signs: Filed Vitals:   04/22/12 2242 04/23/12 0200 04/23/12 0608 04/23/12 1122  BP: 155/89 138/78 149/96 153/98  Pulse: 67 62 67 64  Temp: 97.9 F (36.6 C) 97.9 F (36.6 C) 97.5 F (36.4 C) 97.8 F (36.6 C)  TempSrc: Oral Oral Oral Oral  Resp: 20 20 20 20   Height:      Weight:      SpO2: 97% 98% 94% 100%   CBG (last 3)   Basename 04/23/12 0655 04/22/12 2244 04/22/12 1638  GLUCAP 199* 157* 181*    IV Fluid Intake:     MEDICATIONS     . atorvastatin  10 mg Oral q1800  . carvedilol  25 mg Oral BID  . clopidogrel  75 mg Oral Daily  . furosemide  80 mg Oral Daily  . gabapentin  400 mg Oral BID  . glyBURIDE  5 mg Oral BID WC  . heparin  5,000 Units Subcutaneous Q8H  . insulin aspart  0-15 Units Subcutaneous TID WC  . insulin aspart  0-5 Units Subcutaneous QHS  . insulin glargine  15 Units Subcutaneous QHS  . lisinopril  20 mg Oral Daily  .  lubiprostone  24 mcg Oral BID WC  . NIFEdipine  30 mg Oral Daily  . omega-3 acid ethyl esters  1 g Oral Daily  . pantoprazole  80 mg Oral Daily  . polyethylene glycol  17 g Oral Daily  . potassium chloride  20 mEq Oral Daily  . potassium chloride  40 mEq Oral Once  . DISCONTD: insulin glargine  10 Units Subcutaneous QHS  . DISCONTD: potassium chloride  20 mEq Oral Daily   PRN:  acetaminophen, perflutren lipid microspheres (DEFINITY) IV suspension  Diet:  Carb Control thin liquids Activity:   Bathroom privileges with assistance DVT Prophylaxis:  Heparin 5000 units sq tid  CLINICALLY SIGNIFICANT STUDIES Basic Metabolic Panel:   Lab 04/23/12 0510 04/22/12 0625  NA 140 140  K 3.6 3.3*  CL 101 100  CO2 30 28  GLUCOSE 190* 210*  BUN 10 9  CREATININE 0.93 0.86  CALCIUM 9.4 9.4  MG 2.0 --  PHOS -- --   Liver Function Tests:   Lab 04/19/12 1810  AST 29  ALT 17  ALKPHOS 75  BILITOT 0.5  PROT 7.6  ALBUMIN 4.4   CBC:   Lab 04/22/12 0625 04/21/12 0500 04/19/12 1810  WBC 5.6 7.3 --  NEUTROABS -- -- 7.2  HGB 15.0 15.3 --  HCT 42.8 42.5 --  MCV 88.1  86.7 --  PLT 130* 131* --   Coagulation:   Lab 04/19/12 1810  LABPROT 14.2  INR 1.11   Cardiac Enzymes:   Lab 04/19/12 1810  CKTOTAL --  CKMB --  CKMBINDEX --  TROPONINI <0.30   Lipid Panel    Component Value Date/Time   CHOL 245* 04/20/2012 0530   TRIG 187* 04/20/2012 0530   HDL 39* 04/20/2012 0530   CHOLHDL 6.3 04/20/2012 0530   VLDL 37 04/20/2012 0530   LDLCALC 169* 04/20/2012 0530   HgbA1C  Lab Results  Component Value Date   HGBA1C 8.5* 04/19/2012    Urine Drug Screen:     Component Value Date/Time   LABOPIA NONE DETECTED 04/19/2012 1939   COCAINSCRNUR NONE DETECTED 04/19/2012 1939   LABBENZ NONE DETECTED 04/19/2012 1939   AMPHETMU NONE DETECTED 04/19/2012 1939   THCU NONE DETECTED 04/19/2012 1939   LABBARB NONE DETECTED 04/19/2012 1939    Alcohol Level: No results found for this basename:  ETH:2 in the last 168 hours   CT of the brain  04/19/2012  No evidence of acute intracranial abnormality.  Old lacunar infarcts in the left basal ganglia/thalamus.  Suspected small vessel ischemic changes.    MRI of the brain  04/20/2012 Limited study.  Negative for acute infarct.    MRA of the brain    2D Echocardiogram  EF 35% to 40%. Probable severe hypokinesis of the inferior myocardium. Probable hypokinesis of the anteroseptal myocardium. Doppler parameters are consistent with abnormal left ventricular relaxation (grade 1 diastolic dysfunction). - Ventricular septum: Septal motion showed paradox.   Carotid Doppler  No evidence of hemodynamically significant internal carotid artery stenosis. Vertebral artery flow is antegrade.   CXR    TCD normal posterior circulation. Absent bitemporal windows limits evaluation of anterior circulation.  EKG  normal sinus rhythm.   Therapy Recommendations none needed  Physical Exam   Mental Status:  Able to follow commands without difficulty, no aphasia, no dysarthria.  Cranial Nerves:  II-Visual fields were normal.  III/IV/VI-Pupils were equal and reacted. Extraocular movements were full and conjugate.  V/VII-no facial numbness and no facial weakness.  VIII-normal.  X-normal speech and symmetrical palatal movement.  XII-midline tongue extension  Motor: 5/5 bilaterally with normal tone and bulk  Sensory: Normal throughout.  Deep Tendon Reflexes: 2+ and symmetric.  Plantars: Flexor bilaterally  Cerebellar: Normal finger-to-nose and heel-to-shin testing bilaterally.  Carotid auscultation: Normal  Ct Head Wo Contrast: Two lacunar infarcts in the left basal ganglia/thalamus    ASSESSMENT [name] is a 46 y.o. male presenting with acute onset speech difficulty. Imaging confirms a no acute infarct. likely left brain TIA. Work up underway. On clopidogrel 75 mg orally every day prior to admission. Now on clopidogrel 75 mg orally every  day for secondary stroke prevention. Patient with no resultant neuro symptoms.   Ischemic cardiomyopathy  CAD, MI, CABG 2007  Tobacco abuser  Diabetes, HgbA1c 8.5 Hyperlipidemia, LDL 169, not on statin PTA, allergic to pravastatin, goal LDL < 100 Hypertension  Hypokalemia  Hospital day # 4  TREATMENT/PLAN  Continue clopidogrel 75 mg orally every day for secondary stroke prevention.Will not change to Aggrenox since patient has drug coated cardiac stents.  Consider alternative statin given intolerance to pravastatin, patient on fish oil product at home.  Ongoing risk factor control  Tobacco cessation counselling Stroke service will sign off.   Delia Heady, MD Medical Director [facility] Pager: [phone] 04/23/2012 11:25 AM  04/23/2012  11:24 AM

## 2012-04-23 NOTE — Telephone Encounter (Signed)
Please schedule this pt for post hosp follow up for decreased ef and diffuse hypokensis. Pt was seen by Myrtis Ser in the hospital. (Former [facility] pt). Pt needs to be seen in 2-3 wks pls. Thanks again

## 2012-04-23 NOTE — Telephone Encounter (Signed)
Message copied by Megan Salon on Wed Apr 23, 2012  3:52 PM ------      Message from: Dewain Penning      Created: Wed Apr 23, 2012 10:55 AM       Alesia Banda! Hope you are doing ok. Please schedule this pt for post hosp follow up for decreased ef and diffuse hypokensis. Pt was seen by Myrtis Ser in the hospital. (Former [facility] pt). Pt needs to be seen in 2-3 wks pls.  Thanks again                  [place]

## 2012-04-23 NOTE — Discharge Summary (Signed)
Physician Discharge Summary  Edwin White ZOX:[id number] DOB: 1965/08/17 DOA: 04/19/2012  PCP: Sheila Oats, MD  Admit date: 04/19/2012 Discharge date: 04/23/2012  Time spent: 40 minutes  Recommendations for Outpatient Follow-up:  1. Follow up with primary MD., Dr Kirstie Peri. Dr Sherryll Burger is requested to monitor patient for signs of intolerance or allergy to Statin.  2. Follow up with Dr Willa Rough, cardiologist in 2-3 weeks.   Discharge Diagnoses:  Principal Problem:  *TIA (transient ischemic attack) Active Problems:  DIAB W/O MENTION COMP TYPE II/UNS TYPE UNCNTRL  Pure hypercholesterolemia  NONDEPENDENT TOBACCO USE DISORDER  Old myocardial infarction  CHRONIC AIRWAY OBSTRUCTION NEC  PERS HX NONCOMPLIANCE W/MED TX PRS HAZARDS HLTH  Gastroparesis  Ischemic cardiomyopathy  Hypertension  Thrombocytopenia  Hypokalemia   Discharge Condition: Satisfactory.   Diet recommendation: Heart-Healthy/Carbohydrate-Modified.   Filed Weights   04/19/12 1816 04/19/12 2121  Weight: 99.791 kg (220 lb) 98.748 kg (217 lb 11.2 oz)    History of present illness:  46 y.o. male with a history of previous stroke, HTN, prior TIA, hyperlipidemia, type 2 diabetes mellitus, coronary artery disease with CABG and congestive heart failure, presenting to the ED with sudden onset of aphasia as well as left-sided facial weakness, without upper or lower extremity weakness. Patient has not been compliant with taking his medications including antiplatelet medication. He was last seen normal at 16:30 PM on 04/20/12. CT scan of his head showed old left basal ganglia lacunar infarctions, but no acute abnormality. He had improvement in his speech in the ED, and because of his fairly rapid improvement thrombolytic therapy with TPA was not elected. He was admitted for TIA work up and management.   Hospital Course:  1. TIA: patient presented with transient dysphasia/facial weakness, which rapidly improved after  arrival in the ED. T-PA was therefore, not administered. MRI did not show acute infarct. Dysarthria and facial weakness have resolved. 2-D echo with contrast showed EF of 25-30% and diffuse hypokinesis (cardiology consulted). Carotid Dopplers negative. Patient has been placed on Plavix for secondary stroke prevention and counseled regarding compliance with medications. 2. Uncontrolled DM 2: This was managed with diet, Glyburide, sliding scale insulin and increased Lantus, with reasonable control. HBA1C was 8.5.  3. Hypertension: Controlled on Coreg, Lisinopril and Procardia, during hospitalization.  4. Hypokalemia: Likely due to diuretic therapy. Repleted as indicated.  5. Mild thrombocytopenia: Unclear etiology. Stable. Will defer to PMD, for follow up.  6. Hyperlipidemia: Patient is allergic to pravastatin. Continued on  Fish oil. Lipitor substituted.  7. Ischemic cardiomyopathy: In 07/12 echo showed LVEF of 40-45%. Repeat echo with contrast, done as part of TIA work up, showed worsened EF of 25-30% and diffuse hypokinesis. [facility] cardiology consulted. Consultation was provided by Dr Willa Rough, who has recommended optimization of beta-blocker and ACE -i therapy, as well as meticulous compliance. As he is largely asymptomatic from an ischemic, arrhythmic and heart failure standpoint, no immediate ischemic work up is planned. He will follow up with cardiologist on discharge.  8. History of chronic systolic congestive heart failure: Compensated. See discussion in #7 above. 9. Noncompliance: Counseled regarding compliance with medical therapy. Patient verbalized understanding. 10: Tobacco abuse. Patient unfortunately, continues to smoke. He has been counseled appropriately.   Procedures:  See below  Consultations:  Dr Noel Christmas, neurologist.  Dr Willa Rough, cardiologist.   Discharge Exam: Filed Vitals:   04/22/12 1800 04/22/12 2242 04/23/12 0200 04/23/12 0608  BP: 127/73 155/89  138/78 149/96  Pulse: 80 67 62 67  Temp: 98.5 F (36.9 C) 97.9 F (36.6 C) 97.9 F (36.6 C) 97.5 F (36.4 C)  TempSrc: Oral Oral Oral Oral  Resp: 18 20 20 20   Height:      Weight:      SpO2: 100% 97% 98% 94%   General: Comfortable, alert, communicative, fully oriented, not short of breath at rest.  HEENT:  No clinical pallor, no jaundice, no conjunctival injection or discharge. Hydration status is fair NECK:  Supple, JVP not seen, no carotid bruits, no palpable lymphadenopathy, no palpable goiter. CHEST:  Clinically clear to auscultation, no wheezes, no crackles. HEART:  Sounds 1 and 2 heard, normal, regular, no murmurs. ABDOMEN:  Full, soft, non-tender, no palpable organomegaly, no palpable masses, normal bowel sounds. GENITALIA:  Not examined. LOWER EXTREMITIES:  No pitting edema, palpable peripheral pulses. MUSCULOSKELETAL SYSTEM:  Unremarkable.  CENTRAL NERVOUS SYSTEM:  No focal neurologic deficit on gross examination.   Discharge Instructions      Discharge Orders    Future Orders Please Complete By Expires   Diet - low sodium heart healthy      Diet Carb Modified      Increase activity slowly          Medication List     As of 04/23/2012 11:06 AM    STOP taking these medications         hyoscyamine 0.125 MG SL tablet   Commonly known as: LEVSIN SL      hyoscyamine 0.125 MG tablet   Commonly known as: LEVSIN, ANASPAZ      TAKE these medications         AMITIZA 24 MCG capsule   Generic drug: lubiprostone   Take 24 mcg by mouth 2 (two) times daily with a meal.      atorvastatin 10 MG tablet   Commonly known as: LIPITOR   Take 1 tablet (10 mg total) by mouth daily at 6 PM.      carvedilol 25 MG tablet   Commonly known as: COREG   Take 1 tablet (25 mg total) by mouth 2 (two) times daily.      clopidogrel 75 MG tablet   Commonly known as: PLAVIX   Take 1 tablet (75 mg total) by mouth daily.      fish oil-omega-3 fatty acids 1000 MG capsule   Take 1  g by mouth 2 (two) times daily.      furosemide 80 MG tablet   Commonly known as: LASIX   Take 1 tablet (80 mg total) by mouth daily.      gabapentin 400 MG capsule   Commonly known as: NEURONTIN   Take 400 mg by mouth 2 (two) times daily.      glyBURIDE 5 MG tablet   Commonly known as: DIABETA   Take 5 mg by mouth 2 (two) times daily with a meal.      insulin glargine 100 UNIT/ML injection   Commonly known as: LANTUS   Inject 20 Units into the skin as directed.      lisinopril 20 MG tablet   Commonly known as: PRINIVIL,ZESTRIL   Take 1 tablet (20 mg total) by mouth daily.      NIFEdipine 30 MG (OSM) 24 hr tablet   Commonly known as: PROCARDIA-XL   Take 1 tablet (30 mg total) by mouth daily.      omeprazole 40 MG capsule   Commonly known as: PRILOSEC   Take 40 mg by mouth daily.  polyethylene glycol packet   Commonly known as: MIRALAX / GLYCOLAX   Take 17 g by mouth daily.      potassium chloride SA 20 MEQ tablet   Commonly known as: K-DUR,KLOR-CON   Take 1 tablet (20 mEq total) by mouth daily.        Follow-up Information    Follow up with [facility], MD. Call in 1 week.   Contact information:   [address]  [place] [address] [phone]       Schedule an appointment as soon as possible for a visit with Willa Rough, MD. (2-3 weeks)    Contact information:   [address] [place] [address] [phone]           The results of significant diagnostics from this hospitalization (including imaging, microbiology, ancillary and laboratory) are listed below for reference.    Significant Diagnostic Studies: Ct Head Wo Contrast  04/19/2012  *RADIOLOGY REPORT*  Clinical Data: Right-sided aphasia  CT HEAD WITHOUT CONTRAST  Technique:  Contiguous axial images were obtained from the base of the skull through the vertex without contrast.  Comparison: [facility] CT head dated 12/24/2009, [facility] MRI brain dated 02/17/2008.  Findings: No  evidence of parenchymal hemorrhage or extra-axial fluid collection. No mass lesion, mass effect, or midline shift.  No CT evidence of acute infarction.  Two lacunar infarcts in the left basal ganglia/thalamus (series 2/image 19), chronic.  Suspected subcortical white matter and periventricular small vessel ischemic changes, most prominently in the subcortical left frontal lobe (series 2/image 25).  Cerebral volume is age appropriate.  No ventriculomegaly.  Minimal fluid in the right maxillary sinus.  The visualized paranasal sinuses and mastoid air cells are essentially clear.  No evidence of calvarial fracture.  IMPRESSION: No evidence of acute intracranial abnormality.  Old lacunar infarcts in the left basal ganglia/thalamus.  Suspected small vessel ischemic changes.  These results were called by telephone on 04/19/2012 at 1820 hours to Dr Roseanne Reno, who verbally acknowledged these results.   Original Report Authenticated By: Charline Bills, M.D.    Mr Angiogram Head Wo Contrast  04/21/2012  *RADIOLOGY REPORT*  Clinical Data: Stroke  MRA HEAD WITHOUT CONTRAST  Technique: Angiographic images of the Circle of Willis were obtained using MRA technique without intravenous contrast.  Comparison: MRI 04/20/2012.  CT 04/19/2012.  MRI 02/17/2008.  MR angiography 10/04/2005.  Findings: Both vertebral arteries are patent to the basilar.  No distal vertebral stenosis.  The basilar artery is patent to.  There is motion artifact at the distal basilar artery that simulates a dissection, but this is not representative of true pathology. Superior cerebellar and posterior cerebral vessels are patent. More distal branch vessels of the PCA use show some atherosclerotic narrowing and irregularity.  Both internal carotid arteries are patent into the brain.  There is narrowing in the siphon regions.  The supraclinoid internal carotid arteries are widely patent bilaterally.  On the left, the anterior and middle cerebral vessels are  patent without proximal stenosis, aneurysm or vascular malformation.  More distal branch vessels show atherosclerotic irregularity.  On the right, the anterior cerebral artery is widely patent.  The middle cerebral artery is chronically occluded beyond an anterior temporal branch.  This appearance was noted in 2007.  IMPRESSION: Similar appearance to the examination of 2007.  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  Distal vessel atherosclerotic irregularity diffusely.   Original Report Authenticated By: Thomasenia Sales, M.D.  Mr Brain Ltd W/o Cm  04/20/2012  *RADIOLOGY REPORT*  Clinical Data: Aphasia  MRI HEAD WITHOUT CONTRAST  Technique:  Multiplanar, multiecho pulse sequences of the brain and surrounding structures were obtained according to standard protocol without intravenous contrast.  Comparison: CT 04/19/2012.  Findings: The study is limited to diffusion weighted imaging only. This sequence is of good quality and shows no acute infarct.  There is evidence of chronic ischemia in the left basal ganglia, as noted on the recent CT.  The patient refused further imaging.  IMPRESSION: Limited study.  Negative for acute infarct.   Original Report Authenticated By: Camelia Phenes, M.D.     Microbiology: No results found for this or any previous visit (from the past 240 hour(s)).   Labs: Basic Metabolic Panel:  Lab 04/23/12 1610 04/22/12 0625 04/21/12 0500 04/19/12 2130 04/19/12 1813 04/19/12 1810  NA 140 140 138 -- 141 139  K 3.6 3.3* 3.0* -- 3.8 3.8  CL 101 100 100 -- 102 101  CO2 30 28 29  -- -- 26  GLUCOSE 190* 210* 167* -- 134* 139*  BUN 10 9 12  -- 7 8  CREATININE 0.93 0.86 1.08 0.98 1.10 --  CALCIUM 9.4 9.4 9.2 -- -- 9.7  MG 2.0 -- -- -- -- --  PHOS -- -- -- -- -- --   Liver Function Tests:  Lab 04/19/12 1810  AST 29  ALT 17  ALKPHOS 75  BILITOT 0.5  PROT 7.6  ALBUMIN 4.4   No results found for this basename: LIPASE:5,AMYLASE:5  in the last 168 hours No results found for this basename: AMMONIA:5 in the last 168 hours CBC:  Lab 04/22/12 0625 04/21/12 0500 04/19/12 2130 04/19/12 1813 04/19/12 1810  WBC 5.6 7.3 11.5* -- 11.2*  NEUTROABS -- -- -- -- 7.2  HGB 15.0 15.3 16.6 16.3 16.9  HCT 42.8 42.5 44.9 48.0 45.2  MCV 88.1 86.7 86.0 -- 86.3  PLT 130* 131* 146* -- 155   Cardiac Enzymes:  Lab 04/19/12 1810  CKTOTAL --  CKMB --  CKMBINDEX --  TROPONINI <0.30   BNP: BNP (last 3 results) No results found for this basename: PROBNP:3 in the last 8760 hours CBG:  Lab 04/23/12 0655 04/22/12 2244 04/22/12 1638 04/22/12 0642 04/21/12 2214  GLUCAP 199* 157* 181* 223* 200*       Signed:  Orine Goga,CHRISTOPHER  [facility] 04/23/2012, 11:06 AM

## 2012-05-16 ENCOUNTER — Encounter: Payer: Medicaid Other | Admitting: Cardiology

## 2013-12-31 ENCOUNTER — Encounter (HOSPITAL_COMMUNITY): Payer: Self-pay | Admitting: General Practice

## 2013-12-31 ENCOUNTER — Inpatient Hospital Stay (HOSPITAL_COMMUNITY)
Admission: EM | Admit: 2013-12-31 | Discharge: 2014-01-05 | DRG: 248 | Disposition: A | Discharge status: Home / Self Care | Payer: Medicaid Other | Source: Other Acute Inpatient Hospital | Attending: Cardiology | Admitting: Cardiology

## 2013-12-31 DIAGNOSIS — Z5900 Homelessness unspecified: Secondary | ICD-10-CM | POA: Diagnosis not present

## 2013-12-31 DIAGNOSIS — Z91199 Patient's noncompliance with other medical treatment and regimen due to unspecified reason: Secondary | ICD-10-CM

## 2013-12-31 DIAGNOSIS — Z888 Allergy status to other drugs, medicaments and biological substances status: Secondary | ICD-10-CM

## 2013-12-31 DIAGNOSIS — Z7982 Long term (current) use of aspirin: Secondary | ICD-10-CM

## 2013-12-31 DIAGNOSIS — Z9119 Patient's noncompliance with other medical treatment and regimen: Secondary | ICD-10-CM

## 2013-12-31 DIAGNOSIS — I10 Essential (primary) hypertension: Secondary | ICD-10-CM | POA: Diagnosis present

## 2013-12-31 DIAGNOSIS — I25731 Atherosclerosis of nonautologous biological coronary artery bypass graft(s) with angina pectoris with documented spasm: Secondary | ICD-10-CM

## 2013-12-31 DIAGNOSIS — I2582 Chronic total occlusion of coronary artery: Secondary | ICD-10-CM | POA: Diagnosis present

## 2013-12-31 DIAGNOSIS — I059 Rheumatic mitral valve disease, unspecified: Secondary | ICD-10-CM | POA: Diagnosis present

## 2013-12-31 DIAGNOSIS — I5022 Chronic systolic (congestive) heart failure: Secondary | ICD-10-CM

## 2013-12-31 DIAGNOSIS — Z79899 Other long term (current) drug therapy: Secondary | ICD-10-CM | POA: Diagnosis not present

## 2013-12-31 DIAGNOSIS — I2589 Other forms of chronic ischemic heart disease: Secondary | ICD-10-CM | POA: Diagnosis present

## 2013-12-31 DIAGNOSIS — I25810 Atherosclerosis of coronary artery bypass graft(s) without angina pectoris: Secondary | ICD-10-CM | POA: Diagnosis present

## 2013-12-31 DIAGNOSIS — Z8673 Personal history of transient ischemic attack (TIA), and cerebral infarction without residual deficits: Secondary | ICD-10-CM | POA: Diagnosis not present

## 2013-12-31 DIAGNOSIS — Z7902 Long term (current) use of antithrombotics/antiplatelets: Secondary | ICD-10-CM

## 2013-12-31 DIAGNOSIS — F17200 Nicotine dependence, unspecified, uncomplicated: Secondary | ICD-10-CM | POA: Diagnosis present

## 2013-12-31 DIAGNOSIS — I255 Ischemic cardiomyopathy: Secondary | ICD-10-CM | POA: Diagnosis present

## 2013-12-31 DIAGNOSIS — E785 Hyperlipidemia, unspecified: Secondary | ICD-10-CM | POA: Diagnosis present

## 2013-12-31 DIAGNOSIS — I214 Non-ST elevation (NSTEMI) myocardial infarction: Principal | ICD-10-CM

## 2013-12-31 DIAGNOSIS — Z794 Long term (current) use of insulin: Secondary | ICD-10-CM | POA: Diagnosis not present

## 2013-12-31 DIAGNOSIS — I2510 Atherosclerotic heart disease of native coronary artery without angina pectoris: Secondary | ICD-10-CM | POA: Diagnosis present

## 2013-12-31 DIAGNOSIS — I5023 Acute on chronic systolic (congestive) heart failure: Secondary | ICD-10-CM | POA: Diagnosis present

## 2013-12-31 DIAGNOSIS — I509 Heart failure, unspecified: Secondary | ICD-10-CM | POA: Diagnosis present

## 2013-12-31 DIAGNOSIS — Z8249 Family history of ischemic heart disease and other diseases of the circulatory system: Secondary | ICD-10-CM

## 2013-12-31 DIAGNOSIS — R0602 Shortness of breath: Secondary | ICD-10-CM | POA: Diagnosis present

## 2013-12-31 DIAGNOSIS — I252 Old myocardial infarction: Secondary | ICD-10-CM

## 2013-12-31 DIAGNOSIS — E119 Type 2 diabetes mellitus without complications: Secondary | ICD-10-CM | POA: Diagnosis present

## 2013-12-31 DIAGNOSIS — K3184 Gastroparesis: Secondary | ICD-10-CM | POA: Diagnosis present

## 2013-12-31 HISTORY — DX: Cerebral infarction, unspecified: I63.9

## 2013-12-31 HISTORY — DX: Gastro-esophageal reflux disease without esophagitis: K21.9

## 2013-12-31 HISTORY — DX: Non-ST elevation (NSTEMI) myocardial infarction: I21.4

## 2013-12-31 LAB — GLUCOSE, CAPILLARY: GLUCOSE-CAPILLARY: 299 mg/dL — AB (ref 70–99)

## 2013-12-31 MED ORDER — POTASSIUM CHLORIDE CRYS ER 20 MEQ PO TBCR
20.0000 meq | EXTENDED_RELEASE_TABLET | Freq: Every day | ORAL | Status: DC
Start: 2014-01-01 — End: 2014-01-05
  Administered 2014-01-01 – 2014-01-05 (×5): 20 meq via ORAL
  Filled 2013-12-31 (×6): qty 1

## 2013-12-31 MED ORDER — ONDANSETRON HCL 4 MG/2ML IJ SOLN: 4.0000 mg | Freq: Four times a day (QID) | INTRAMUSCULAR | Status: DC | PRN

## 2013-12-31 MED ORDER — ACETAMINOPHEN 325 MG PO TABS: 650.0000 mg | ORAL_TABLET | ORAL | Status: DC | PRN

## 2013-12-31 MED ORDER — CLOPIDOGREL BISULFATE 75 MG PO TABS
75.0000 mg | ORAL_TABLET | Freq: Every day | ORAL | Status: DC
Start: 2014-01-01 — End: 2014-01-05
  Administered 2014-01-01 – 2014-01-05 (×4): 75 mg via ORAL
  Filled 2013-12-31 (×5): qty 1

## 2013-12-31 MED ORDER — NITROGLYCERIN 0.4 MG SL SUBL: 0.4000 mg | SUBLINGUAL_TABLET | SUBLINGUAL | Status: DC | PRN

## 2013-12-31 MED ORDER — INSULIN ASPART 100 UNIT/ML ~~LOC~~ SOLN
0.0000 [IU] | Freq: Every day | SUBCUTANEOUS | Status: DC
  Administered 2014-01-01: 2 [IU] via SUBCUTANEOUS
  Administered 2014-01-01: 3 [IU] via SUBCUTANEOUS
  Administered 2014-01-03: 2 [IU] via SUBCUTANEOUS

## 2013-12-31 MED ORDER — LISINOPRIL 20 MG PO TABS
20.0000 mg | ORAL_TABLET | Freq: Every day | ORAL | Status: DC
  Administered 2014-01-01 – 2014-01-02 (×2): 20 mg via ORAL
  Filled 2013-12-31 (×2): qty 1

## 2013-12-31 MED ORDER — INSULIN ASPART 100 UNIT/ML ~~LOC~~ SOLN
0.0000 [IU] | Freq: Three times a day (TID) | SUBCUTANEOUS | Status: DC
  Administered 2014-01-01: 8 [IU] via SUBCUTANEOUS
  Administered 2014-01-01 (×2): 5 [IU] via SUBCUTANEOUS
  Administered 2014-01-02 (×2): 8 [IU] via SUBCUTANEOUS
  Administered 2014-01-02: 5 [IU] via SUBCUTANEOUS
  Administered 2014-01-03 (×2): 8 [IU] via SUBCUTANEOUS
  Administered 2014-01-03: 3 [IU] via SUBCUTANEOUS
  Administered 2014-01-04: 5 [IU] via SUBCUTANEOUS
  Administered 2014-01-04 (×2): 3 [IU] via SUBCUTANEOUS
  Administered 2014-01-05: 5 [IU] via SUBCUTANEOUS

## 2013-12-31 MED ORDER — ATORVASTATIN CALCIUM 10 MG PO TABS
10.0000 mg | ORAL_TABLET | Freq: Every day | ORAL | Status: DC
  Administered 2014-01-01 – 2014-01-04 (×4): 10 mg via ORAL
  Filled 2013-12-31 (×6): qty 1

## 2013-12-31 MED ORDER — CARVEDILOL 6.25 MG PO TABS
6.2500 mg | ORAL_TABLET | Freq: Two times a day (BID) | ORAL | Status: DC
  Administered 2014-01-01 – 2014-01-05 (×9): 6.25 mg via ORAL
  Filled 2013-12-31 (×4): qty 1
  Filled 2013-12-31: qty 2
  Filled 2013-12-31 (×8): qty 1

## 2013-12-31 MED ORDER — ASPIRIN EC 81 MG PO TBEC
81.0000 mg | DELAYED_RELEASE_TABLET | Freq: Every day | ORAL | Status: DC
Start: 2014-01-01 — End: 2014-01-05
  Administered 2014-01-01 – 2014-01-05 (×5): 81 mg via ORAL
  Filled 2013-12-31 (×5): qty 1

## 2013-12-31 MED ORDER — PNEUMOCOCCAL VAC POLYVALENT 25 MCG/0.5ML IJ INJ
0.5000 mL | INJECTION | INTRAMUSCULAR | Status: AC
  Administered 2014-01-01: 0.5 mL via INTRAMUSCULAR
  Filled 2013-12-31: qty 0.5

## 2013-12-31 NOTE — H&P (Signed)
History and Physical  Patient ID: Edwin White MRN: [id number], SOB: Aug 17, 1965 48 y.o. Date of Encounter: 12/31/2013, 10:36 PM  Primary Physician: Default, Provider, MD Primary Cardiologist: previously seen by Dr. Earnestine Leyse White  Chief Complaint: acute shortness of breath  HPI: 48 y.o. male w/ PMHx significant for CAD s/p CABG 2007, ischemic CMP with EF 25-35% in 2013, DM2, who [facility] with [facility]ness of breath. Sudden onset this afternoone while drive. Associated with mild diaphoresis. No other symptoms. No clear chest pain.  Reports intermittent compliance with medications. Unclear which medications he takes. Unable to afford several medications. Gets them from health department.  Currently chest pain free. No complaints except he is hungry.  Outside records show: Na 132 k 3.8 Cr 1.0 Nl lfts Tropon 1.2 bnp 562 Wbc 7.7 hct 47 plts 142  Xcray: read as mild edema EKG: sinus tachy with left axis deviation, septal Q's, no ST changes  [facility],  Given aspirin 325, lasix 20 IV x 1, insuline, and heparin gtt.    Past Medical History  Diagnosis Date  . CHF (congestive heart failure)     No recurrent admissions for heart failure.  . Ischemic cardiomyopathy     Ejection fraction improved from 15%-20% to 40%-45% in 2012 by echocardiogram  . Coronary artery disease     Status post coronary bypass grafting in 2007  . History of medication noncompliance   . Tobacco user   . Moderate mitral regurgitation by prior echocardiogram   . Type 2 diabetes mellitus   . Post-infarction apical thrombus     Anticoagulation discontinued  . Crescendo transient ischemic attacks   . Hyperlipidemia   . Heart attack September 13, 2004    Followed by bypass surgery  . Gastroparesis     Gastric emptying study scheduled  . Functional bowel disorder     Bloating and abdominal cramping as well as constipation requiring lubiprostone  . Hypertension     Poorly controlled      Surgical History:  Past Surgical History  Procedure Laterality Date  . Coronary artery bypass graft    . Cardiac catheterization       Home Meds: Prior to Admission medications   Glyburide 5 plavix 75 kcl 20 Nifedipine 30 Lisinopril 20 Lasix 20   Allergies:  Allergies  Allergen Reactions  . Pravastatin Other (See Comments)    PT CANNOT NOT WALK OR MOVE.    History   Social History  . Marital Status: Legally Separated    Spouse Name: N/A    Number of Children: N/A  . Years of Education: N/A   Occupational History  . Not on file.   Social History Main Topics  . Smoking status: Current Every Day Smoker -- 0.50 packs/day for 34 years    Types: Cigarettes  . Smokeless tobacco: Never Used  . Alcohol Use: No     Comment: quit years ago  . Drug Use: Not on file  . Sexual Activity: Not on file   Other Topics Concern  . Not on file   Social History Narrative   Disabled.      Family History  Problem Relation Age of Onset  . Coronary artery disease Father    Review of Systems: General: negative for chills, fever, night sweats or weight changes.  Cardiovascular: see HPI Dermatological: negative for rash Respiratory: negative for cough or wheezing Urologic: negative for hematuria Abdominal: negative for nausea, vomiting, diarrhea, bright red blood per rectum, melena, or  hematemesis Neurologic: negative for visual changes, syncope, or dizziness All other systems reviewed and are otherwise negative except as noted above.  Labs:   Lab Results  Component Value Date   WBC 5.6 04/22/2012   HGB 15.0 04/22/2012   HCT 42.8 04/22/2012   MCV 88.1 04/22/2012   PLT 130* 04/22/2012   No results found for this basename: NA, K, CL, CO2, BUN, CREATININE, CALCIUM, LABALBU, PROT, BILITOT, ALKPHOS, ALT, AST, GLUCOSE,  in the last 168 hours No results found for this basename: CKTOTAL, CKMB, TROPONINI,  in the last 72 hours Lab Results  Component Value Date   CHOL 245*  04/20/2012   HDL 39* 04/20/2012   LDLCALC 169* 04/20/2012   TRIG 187* 04/20/2012   Lab Results  Component Value Date   DDIMER  Value: 0.40        AT THE INHOUSE ESTABLISHED CUTOFF VALUE OF 0.48 ug/mL FEU, THIS ASSAY HAS BEEN DOCUMENTED IN THE LITERATURE TO HAVE A SENSITIVITY AND NEGATIVE PREDICTIVE VALUE OF AT LEAST 98 TO 99%.  THE TEST RESULT SHOULD BE CORRELATED WITH AN ASSESSMENT OF THE CLINICAL PROBABILITY OF DVT / VTE. 08/18/2009    Radiology/Studies:  No results found.   EKG: see HPI  Physical Exam: Blood pressure 132/86, pulse 100, temperature 97.2 F (36.2 C), temperature source Oral, resp. rate 18, height 6' (1.829 m), weight 104.1 kg (229 lb 8 oz), SpO2 96.00%. General: Disleveled, in no acute distress. Head: Normocephalic, atraumatic, sclera non-icteric, nares are without discharge Neck: Supple. Negative for carotid bruits. JVD not elevated. Lungs: Clear bilaterally to auscultation without wheezes, rales, or rhonchi. Breathing is unlabored. Heart: RRR with S1 S2. No murmurs, rubs, or gallops appreciated. Abdomen: Soft, non-tender, non-distended with normoactive bowel sounds. No rebound/guarding. No obvious abdominal masses. Msk:  Strength and tone appear normal for age. Extremities: +1 edema at ankles. No clubbing or cyanosis. Distal pedal pulses are 2+ and equal bilaterally. Neuro: Alert and oriented X 3. Moves all extremities spontaneously. Psych:  Responds to questions appropriately with a normal affect.    ASSESSMENT AND PLAN:  Problem List 1. NSTEMI 2. Ischemic CMP 3. Acute on chronic systolic heart failure, last EF 25-30% 2013 4. DM2 5. H/o stroke 6. Hyperlipidemia 7. Med noncompliance 8. Poor social situation 9. Continued tobacco abuse  48 y.o. male w/ PMHx significant for CAD s/p CABG 2007, ischemic CMP with EF 25-35% in 2013, DM2, who [facility] with [facility] of breath --> elevated troponin consistent with NSTEMI.  Continue beta  blocker, ACEI, aspirin, plavix and heparin. Intolerant of pravastatins but previously okay on atorvastatin. Plan for invasive strategy, NPO for catheterization. BARE METAL STENT if needed due to cost issues.  Mildly fluid overload, continue home lasix dose. Due to reduced LVEF, continue carvedilol, lisinopril.  Cover diabetes with sliding scale while NPO.  Smoking cessation strongly emphasized.   Full code.  SW consult to assist with social issues.    Signed, Adolm JosephWHITLOCK, Troy SineMATTHEW C. MD 12/31/2013, 10:36 PM

## 2013-12-31 NOTE — Progress Notes (Signed)
ANTICOAGULATION CONSULT NOTE - Initial Consult  Pharmacy Consult for heparin Indication: NSTEMI  Allergies  Allergen Reactions  . Pravastatin Other (See Comments)    PT CANNOT NOT WALK OR MOVE.    Patient Measurements: Height: 6' (182.9 cm) Weight: 229 lb 8 oz (104.1 kg) IBW/kg (Calculated) : 77.6 Heparin Dosing Weight: 100kg  Vital Signs: Temp: 97.2 F (36.2 C) (07/02 2206) Temp src: Oral (07/02 2206) BP: 132/86 mmHg (07/02 2206) Pulse Rate: 100 (07/02 2206)   Estimated Creatinine Clearance: 119.9 ml/min (by C-G formula based on Cr of 0.93).   Medical History: Past Medical History  Diagnosis Date  . CHF (congestive heart failure)     No recurrent admissions for heart failure.  . Ischemic cardiomyopathy     Ejection fraction improved from 15%-20% to 40%-45% in 2012 by echocardiogram  . Coronary artery disease     Status post coronary bypass grafting in 2007  . History of medication noncompliance   . Moderate mitral regurgitation by prior echocardiogram   . Post-infarction apical thrombus     Anticoagulation discontinued  . Crescendo transient ischemic attacks   . Hyperlipidemia   . Gastroparesis     Gastric emptying study scheduled  . Functional bowel disorder     Bloating and abdominal cramping as well as constipation requiring lubiprostone  . Hypertension     Poorly controlled  . Heart attack September 13, 2004    Followed by bypass surgery  . NSTEMI (non-ST elevated myocardial infarction) 12/31/2013  . Type 2 diabetes mellitus   . GERD (gastroesophageal reflux disease)   . Stroke 04/2012    denies residual on 12/31/2013    Medications:  Prescriptions prior to admission  Medication Sig Dispense Refill  . atorvastatin (LIPITOR) 10 MG tablet Take 1 tablet (10 mg total) by mouth daily at 6 PM.  30 tablet  3  . carvedilol (COREG) 25 MG tablet Take 1 tablet (25 mg total) by mouth 2 (two) times daily.  60 tablet  6  . clopidogrel (PLAVIX) 75 MG tablet Take 1 tablet  (75 mg total) by mouth daily.  30 tablet  3  . fish oil-omega-3 fatty acids 1000 MG capsule Take 1 g by mouth 2 (two) times daily.       . furosemide (LASIX) 80 MG tablet Take 1 tablet (80 mg total) by mouth daily.  30 tablet  3  . gabapentin (NEURONTIN) 400 MG capsule Take 400 mg by mouth 2 (two) times daily.      Marland Kitchen. glyBURIDE (DIABETA) 5 MG tablet Take 5 mg by mouth 2 (two) times daily with a meal.       . insulin glargine (LANTUS SOLOSTAR) 100 UNIT/ML injection Inject 20 Units into the skin as directed.  3 mL  3  . lisinopril (PRINIVIL,ZESTRIL) 20 MG tablet Take 1 tablet (20 mg total) by mouth daily.  20 tablet  3  . lubiprostone (AMITIZA) 24 MCG capsule Take 24 mcg by mouth 2 (two) times daily with a meal.      . NIFEdipine (PROCARDIA-XL) 30 MG (OSM) 24 hr tablet Take 1 tablet (30 mg total) by mouth daily.  30 tablet  3  . omeprazole (PRILOSEC) 40 MG capsule Take 40 mg by mouth daily.      . polyethylene glycol (MIRALAX / GLYCOLAX) packet Take 17 g by mouth daily.        . potassium chloride SA (K-DUR,KLOR-CON) 20 MEQ tablet Take 1 tablet (20 mEq total) by mouth daily.  30 tablet  3   Scheduled:  . aspirin EC  81 mg Oral Daily  . atorvastatin  10 mg Oral q1800  . carvedilol  6.25 mg Oral BID  . clopidogrel  75 mg Oral Daily  . insulin aspart  0-15 Units Subcutaneous TID WC  . insulin aspart  0-5 Units Subcutaneous QHS  . lisinopril  20 mg Oral Daily  . pneumococcal 23 valent vaccine  0.5 mL Intramuscular Tomorrow-1000  . potassium chloride SA  20 mEq Oral Daily    Assessment: 48yo male presented [facility] c/o sudden onset of SOB associated w/ mild diaphoresis though no c/o CP, OSH troponin elevated, [facility]'d [facility] for suspected NSTEMI with prior CAD hx, to begin heparin; OSH gave heparin 2000 units x1 followed by gtt at 1000 units/hr though according to current RN heparin gtt was not running when pt arrived to floor w/ unknown start and stop times.  (OSH labs: K 3.8, Hct 47, WBC  7.7, Plt 142, troponin 1.2)  Goal of Therapy:  Heparin level 0.3-0.7 units/ml Monitor platelets by anticoagulation protocol: Yes   Plan:  Will give another small bolus of 2000 units to complete full bolus of 4000 units then begin gtt at 1400 units/hr and monitor heparin levels and CBC.  Vernard GamblesVeronda Natahlia Hoggard, PharmD, BCPS  12/31/2013,11:59 PM

## 2014-01-01 DIAGNOSIS — I214 Non-ST elevation (NSTEMI) myocardial infarction: Secondary | ICD-10-CM

## 2014-01-01 LAB — CBC
HEMATOCRIT: 40.8 % (ref 39.0–52.0)
HEMOGLOBIN: 14.2 g/dL (ref 13.0–17.0)
MCH: 30.8 pg (ref 26.0–34.0)
MCHC: 34.8 g/dL (ref 30.0–36.0)
MCV: 88.5 fL (ref 78.0–100.0)
Platelets: 141 10*3/uL — ABNORMAL LOW (ref 150–400)
RBC: 4.61 MIL/uL (ref 4.22–5.81)
RDW: 13.4 % (ref 11.5–15.5)
WBC: 7.2 10*3/uL (ref 4.0–10.5)

## 2014-01-01 LAB — TROPONIN I
TROPONIN I: 1.5 ng/mL — AB (ref <0.30)
Troponin I: 3.24 ng/mL (ref <0.30)
Troponin I: 2.99 ng/mL (ref <0.30)

## 2014-01-01 LAB — PROTIME-INR
INR: 1.04 (ref 0.00–1.49)
Prothrombin Time: 13.6 seconds (ref 11.6–15.2)

## 2014-01-01 LAB — BASIC METABOLIC PANEL
Anion gap: 15 (ref 5–15)
BUN: 8 mg/dL (ref 6–23)
CHLORIDE: 94 meq/L — AB (ref 96–112)
CO2: 27 meq/L (ref 19–32)
Calcium: 9.4 mg/dL (ref 8.4–10.5)
Creatinine, Ser: 0.85 mg/dL (ref 0.50–1.35)
GFR calc Af Amer: >90 mL/min (ref >90)
GFR calc non Af Amer: >90 mL/min (ref >90)
Glucose, Bld: 225 mg/dL — ABNORMAL HIGH (ref 70–99)
Potassium: 3.3 mEq/L — ABNORMAL LOW (ref 3.7–5.3)
SODIUM: 136 meq/L — AB (ref 137–147)

## 2014-01-01 LAB — LIPID PANEL
CHOL/HDL RATIO: 5.5 ratio
Cholesterol: 209 mg/dL — ABNORMAL HIGH (ref 0–200)
HDL: 38 mg/dL — AB (ref >39)
LDL Cholesterol: 127 mg/dL — ABNORMAL HIGH (ref 0–99)
TRIGLYCERIDES: 218 mg/dL — AB (ref <150)
VLDL: 44 mg/dL — AB (ref 0–40)

## 2014-01-01 LAB — HEPARIN LEVEL (UNFRACTIONATED)
Heparin Unfractionated: 0.14 IU/mL — ABNORMAL LOW (ref 0.30–0.70)
Heparin Unfractionated: 0.3 IU/mL (ref 0.30–0.70)

## 2014-01-01 LAB — GLUCOSE, CAPILLARY
GLUCOSE-CAPILLARY: 248 mg/dL — AB (ref 70–99)
GLUCOSE-CAPILLARY: 255 mg/dL — AB (ref 70–99)
Glucose-Capillary: 247 mg/dL — ABNORMAL HIGH (ref 70–99)
Glucose-Capillary: 241 mg/dL — ABNORMAL HIGH (ref 70–99)

## 2014-01-01 LAB — HEMOGLOBIN A1C
Hgb A1c MFr Bld: 10.7 % — ABNORMAL HIGH (ref <5.7)
Mean Plasma Glucose: 260 mg/dL — ABNORMAL HIGH (ref <117)

## 2014-01-01 MED ORDER — HEPARIN BOLUS VIA INFUSION
2000.0000 [IU] | Freq: Once | INTRAVENOUS | Status: AC
  Administered 2014-01-01: 2000 [IU] via INTRAVENOUS
  Filled 2014-01-01: qty 2000

## 2014-01-01 MED ORDER — FUROSEMIDE 20 MG PO TABS
20.0000 mg | ORAL_TABLET | Freq: Every day | ORAL | Status: DC
  Administered 2014-01-01 – 2014-01-05 (×5): 20 mg via ORAL
  Filled 2014-01-01 (×6): qty 1

## 2014-01-01 MED ORDER — HEPARIN BOLUS VIA INFUSION
3000.0000 [IU] | INTRAVENOUS | Status: AC
  Administered 2014-01-01: 3000 [IU] via INTRAVENOUS
  Filled 2014-01-01: qty 3000

## 2014-01-01 MED ORDER — HEPARIN (PORCINE) IN NACL 100-0.45 UNIT/ML-% IJ SOLN
1400.0000 [IU]/h | INTRAMUSCULAR | Status: DC
  Administered 2014-01-01: 1400 [IU]/h via INTRAVENOUS
  Filled 2014-01-01 (×2): qty 250

## 2014-01-01 MED ORDER — HEPARIN (PORCINE) IN NACL 100-0.45 UNIT/ML-% IJ SOLN
2100.0000 [IU]/h | INTRAMUSCULAR | Status: DC
  Administered 2014-01-01 (×2): 1700 [IU]/h via INTRAVENOUS
  Administered 2014-01-01: 1800 [IU]/h via INTRAVENOUS
  Administered 2014-01-02 – 2014-01-03 (×2): 2100 [IU]/h via INTRAVENOUS
  Filled 2014-01-01 (×7): qty 250

## 2014-01-01 NOTE — Progress Notes (Signed)
Inpatient Diabetes Program Recommendations  AACE/ADA: New Consensus Statement on Inpatient Glycemic Control (2013)  Target Ranges:  Prepandial:   less than 140 mg/dL      Peak postprandial:   less than 180 mg/dL (1-2 hours)      Critically ill patients:  140 - 180 mg/dL   Results for Edwin White, Edwin White (MRN [id number]) as of 01/01/2014 13:08  Ref. Range 12/31/2013 22:12 01/01/2014 05:56 01/01/2014 11:39  Glucose-Capillary Latest Range: 70-99 mg/dL 409299 (H) 811248 (H) 914255 (H)   Reason for Assessment: elevated CBG Diabetes history: Type 2, no recent A1C Outpatient Diabetes medications: Lantus 20 units daily, Glyburide 5mg  daily Current orders for Inpatient glycemic control: Novolog 0-15 units with meals, 0-5 units QHS   Based on current CBG, patient would benefit from home dose of Lantus 20 units daily.   Susette RacerJulie Joyanne Eddinger, RN, BA, MHA, CDE Diabetes Coordinator Inpatient Diabetes Program  [phone] (Team Pager) [phone] Patrcia Dolly([place] Office) 01/01/2014 1:39 PM

## 2014-01-01 NOTE — Progress Notes (Signed)
Patient ID: Edwin White, male   DOB: 06-10-1966, 48 y.o.   MRN: [id number]     Subjective:   No complaints overnight   Objective:   Temp:  [97.2 F (36.2 C)-98.4 F (36.9 C)] 98.4 F (36.9 C) (07/03 0500) Pulse Rate:  [86-100] 86 (07/03 0500) Resp:  [18-20] 20 (07/03 0500) BP: (121-132)/(74-86) 124/80 mmHg (07/03 0500) SpO2:  [92 %-96 %] 95 % (07/03 0500) Weight:  [228 lb 13.4 oz (103.8 kg)-229 lb 8 oz (104.1 kg)] 228 lb 13.4 oz (103.8 kg) (07/03 0500) Last BM Date: 12/31/13  Filed Weights   12/31/13 2214 01/01/14 0500  Weight: 229 lb 8 oz (104.1 kg) 228 lb 13.4 oz (103.8 kg)    Intake/Output Summary (Last 24 hours) at 01/01/14 0744 Last data filed at 12/31/13 2347  Gross per 24 hour  Intake    240 ml  Output      0 ml  Net    240 ml    Telemetry: NSR  Exam:  General:NAD  Resp:CTAB  Cardiac:RRR, no m/r/g, no JVD  WU:[id number]:abdomen soft, NT, ND  MSK:no LE edema  Neuro: no focal deficits  Psych: appropriate affect  Lab Results:  Basic Metabolic Panel:  Recent Labs Lab 01/01/14 0444  NA 136*  K 3.3*  CL 94*  CO2 27  GLUCOSE 225*  BUN 8  CREATININE 0.85  CALCIUM 9.4    Liver Function Tests: No results found for this basename: AST, ALT, ALKPHOS, BILITOT, PROT, ALBUMIN,  in the last 168 hours  CBC:  Recent Labs Lab 01/01/14 0444  WBC 7.2  HGB 14.2  HCT 40.8  MCV 88.5  PLT 141*    Cardiac Enzymes:  Recent Labs Lab 01/01/14 0201  TROPONINI 3.24*    BNP: No results found for this basename: PROBNP,  in the last 8760 hours  Coagulation:  Recent Labs Lab 01/01/14 0444  INR 1.04    ECG:   Medications:   Scheduled Medications: . aspirin EC  81 mg Oral Daily  . atorvastatin  10 mg Oral q1800  . carvedilol  6.25 mg Oral BID  . clopidogrel  75 mg Oral Daily  . furosemide  20 mg Oral Daily  . insulin aspart  0-15 Units Subcutaneous TID WC  . insulin aspart  0-5 Units Subcutaneous QHS  . lisinopril  20 mg Oral Daily  .  pneumococcal 23 valent vaccine  0.5 mL Intramuscular Tomorrow-1000  . potassium chloride SA  20 mEq Oral Daily     Infusions: . heparin 1,400 Units/hr (01/01/14 0054)     PRN Medications:  acetaminophen, nitroGLYCERIN, ondansetron (ZOFRAN) IV     Assessment/Plan   48 yo male hx of CAD s/p CABG in 2007, ICM LVEF 25-35%, DM2, mixed medical compliance, admitted with SOB.  1. NSTEMI - initial trop 1.2 that had trended up to 3.4, EKG SR with LAFB with TWI/ST depression lateral leads that are old.  - on ASA, plavix (chronically on plavix), statin, ACE, beta blocker, anticoagulation. Continue current lower dose statin as appears he is tolerating and has had significant side effects to alternative statins. - will plan for cath, given mixed history of compliance may consider BMS vs DES if reasonable from anatomic standpoint.  - patient is hemodynamically stable and pain free, given holiday weekend scheduling will plan for cath Monday unless clinical change.   2. Acute on chronic systolic heart failure - volume status improved, breathing improved. Will continue oral lasix - continue coreg and lisinopril  at current doses   Dina RichJonathan Yasmine Kilbourne, M.D., F.A.C.C.

## 2014-01-01 NOTE — Progress Notes (Signed)
ANTICOAGULATION CONSULT NOTE - Follow up  Pharmacy Consult for heparin Indication: NSTEMI  Allergies  Allergen Reactions  . Pravastatin Other (See Comments)    PT CANNOT NOT WALK OR MOVE.    Patient Measurements: Height: 6' (182.9 cm) Weight: 228 lb 13.4 oz (103.8 kg) IBW/kg (Calculated) : 77.6 Heparin Dosing Weight: 100kg  Vital Signs: Temp: 98.4 F (36.9 C) (07/03 0500) Temp src: Oral (07/03 0500) BP: 124/80 mmHg (07/03 0500) Pulse Rate: 86 (07/03 0500)   Labs:  Recent Labs  01/01/14 0201 01/01/14 0444 01/01/14 0735  HGB  --  14.2  --   HCT  --  40.8  --   PLT  --  141*  --   LABPROT  --  13.6  --   INR  --  1.04  --   HEPARINUNFRC  --   --  0.14*  CREATININE  --  0.85  --   TROPONINI 3.24*  --  2.99*    Estimated Creatinine Clearance: 131 ml/min (by C-G formula based on Cr of 0.85).    Medical History: Past Medical History  Diagnosis Date  . CHF (congestive heart failure)     No recurrent admissions for heart failure.  . Ischemic cardiomyopathy     Ejection fraction improved from 15%-20% to 40%-45% in 2012 by echocardiogram  . Coronary artery disease     Status post coronary bypass grafting in 2007  . History of medication noncompliance   . Moderate mitral regurgitation by prior echocardiogram   . Post-infarction apical thrombus     Anticoagulation discontinued  . Crescendo transient ischemic attacks   . Hyperlipidemia   . Gastroparesis     Gastric emptying study scheduled  . Functional bowel disorder     Bloating and abdominal cramping as well as constipation requiring lubiprostone  . Hypertension     Poorly controlled  . Heart attack September 13, 2004    Followed by bypass surgery  . NSTEMI (non-ST elevated myocardial infarction) 12/31/2013  . Type 2 diabetes mellitus   . GERD (gastroesophageal reflux disease)   . Stroke 04/2012    denies residual on 12/31/2013    Medications:  Prescriptions prior to admission  Medication Sig Dispense Refill   . atorvastatin (LIPITOR) 10 MG tablet Take 1 tablet (10 mg total) by mouth daily at 6 PM.  30 tablet  3  . carvedilol (COREG) 25 MG tablet Take 1 tablet (25 mg total) by mouth 2 (two) times daily.  60 tablet  6  . clopidogrel (PLAVIX) 75 MG tablet Take 1 tablet (75 mg total) by mouth daily.  30 tablet  3  . fish oil-omega-3 fatty acids 1000 MG capsule Take 1 g by mouth 2 (two) times daily.       . furosemide (LASIX) 80 MG tablet Take 1 tablet (80 mg total) by mouth daily.  30 tablet  3  . gabapentin (NEURONTIN) 400 MG capsule Take 400 mg by mouth 2 (two) times daily.      Marland Kitchen. glyBURIDE (DIABETA) 5 MG tablet Take 5 mg by mouth 2 (two) times daily with a meal.       . insulin glargine (LANTUS SOLOSTAR) 100 UNIT/ML injection Inject 20 Units into the skin as directed.  3 mL  3  . lisinopril (PRINIVIL,ZESTRIL) 20 MG tablet Take 1 tablet (20 mg total) by mouth daily.  20 tablet  3  . lubiprostone (AMITIZA) 24 MCG capsule Take 24 mcg by mouth 2 (two) times daily with a  meal.      . NIFEdipine (PROCARDIA-XL) 30 MG (OSM) 24 hr tablet Take 1 tablet (30 mg total) by mouth daily.  30 tablet  3  . omeprazole (PRILOSEC) 40 MG capsule Take 40 mg by mouth daily.      . polyethylene glycol (MIRALAX / GLYCOLAX) packet Take 17 g by mouth daily.        . potassium chloride SA (K-DUR,KLOR-CON) 20 MEQ tablet Take 1 tablet (20 mEq total) by mouth daily.  30 tablet  3   Scheduled:  . aspirin EC  81 mg Oral Daily  . atorvastatin  10 mg Oral q1800  . carvedilol  6.25 mg Oral BID  . clopidogrel  75 mg Oral Daily  . furosemide  20 mg Oral Daily  . insulin aspart  0-15 Units Subcutaneous TID WC  . insulin aspart  0-5 Units Subcutaneous QHS  . lisinopril  20 mg Oral Daily  . pneumococcal 23 valent vaccine  0.5 mL Intramuscular Tomorrow-1000  . potassium chloride SA  20 mEq Oral Daily    Assessment: Heparin level isubtherapeutic (=0.14) on heparin IV drip @ 1400 units/hr in this 48yo male on anticoagulation for  NSTEMI. Initial troponin 1.2 that had trended up to 3.4, Telemetry: NSR.  Patient without complaints overnight. PLTC 141,  Hgb 14.2.  No bleeding noted.   Cardiologist plans for cardiac cath on Monday 01/04/14.   Goal of Therapy:  Heparin level 0.3-0.7 units/ml Monitor platelets by anticoagulation protocol: Yes   Plan:  Will give another small bolus of 3000 units IV then increase heparin drip rate to 1700 units/hr.  Monitor 6 hour heparin level. Daily heparin level and CBC.  Noah Delaineuth Avila Albritton, RPh Clinical Pharmacist Pager: [phone] 01/01/2014,9:04 AM

## 2014-01-01 NOTE — Progress Notes (Signed)
ANTICOAGULATION CONSULT NOTE - Follow up  Pharmacy Consult for heparin Indication: NSTEMI  Allergies  Allergen Reactions  . Pravastatin Other (See Comments)    PT CANNOT NOT WALK OR MOVE.    Patient Measurements: Height: 6' (182.9 cm) Weight: 228 lb 13.4 oz (103.8 kg) IBW/kg (Calculated) : 77.6 Heparin Dosing Weight: 100kg  Vital Signs: Temp: 98.1 F (36.7 C) (07/03 1402) Temp src: Oral (07/03 1402) BP: 129/90 mmHg (07/03 1402) Pulse Rate: 83 (07/03 1402)   Labs:  Recent Labs  01/01/14 0201 01/01/14 0444 01/01/14 0735  HGB  --  14.2  --   HCT  --  40.8  --   PLT  --  141*  --   LABPROT  --  13.6  --   INR  --  1.04  --   HEPARINUNFRC  --   --  0.14*  CREATININE  --  0.85  --   TROPONINI 3.24*  --  2.99*    Estimated Creatinine Clearance: 131 ml/min (by C-G formula based on Cr of 0.85).    Medical History: Past Medical History  Diagnosis Date  . CHF (congestive heart failure)     No recurrent admissions for heart failure.  . Ischemic cardiomyopathy     Ejection fraction improved from 15%-20% to 40%-45% in 2012 by echocardiogram  . Coronary artery disease     Status post coronary bypass grafting in 2007  . History of medication noncompliance   . Moderate mitral regurgitation by prior echocardiogram   . Post-infarction apical thrombus     Anticoagulation discontinued  . Crescendo transient ischemic attacks   . Hyperlipidemia   . Gastroparesis     Gastric emptying study scheduled  . Functional bowel disorder     Bloating and abdominal cramping as well as constipation requiring lubiprostone  . Hypertension     Poorly controlled  . Heart attack September 13, 2004    Followed by bypass surgery  . NSTEMI (non-ST elevated myocardial infarction) 12/31/2013  . Type 2 diabetes mellitus   . GERD (gastroesophageal reflux disease)   . Stroke 04/2012    denies residual on 12/31/2013    Medications:  Prescriptions prior to admission  Medication Sig Dispense Refill   . aspirin EC 81 MG tablet Take 81 mg by mouth daily.      Marland Kitchen. atorvastatin (LIPITOR) 10 MG tablet Take 1 tablet (10 mg total) by mouth daily at 6 PM.  30 tablet  3  . clopidogrel (PLAVIX) 75 MG tablet Take 1 tablet (75 mg total) by mouth daily.  30 tablet  3  . furosemide (LASIX) 80 MG tablet Take 1 tablet (80 mg total) by mouth daily.  30 tablet  3  . glyBURIDE (DIABETA) 5 MG tablet Take 5 mg by mouth daily.       . insulin glargine (LANTUS) 100 UNIT/ML injection Inject 20 Units into the skin daily.      Marland Kitchen. lisinopril (PRINIVIL,ZESTRIL) 20 MG tablet Take 1 tablet (20 mg total) by mouth daily.  20 tablet  3  . polyethylene glycol (MIRALAX / GLYCOLAX) packet Take 17 g by mouth daily.       . potassium chloride SA (K-DUR,KLOR-CON) 20 MEQ tablet Take 1 tablet (20 mEq total) by mouth daily.  30 tablet  3  . [DISCONTINUED] insulin glargine (LANTUS SOLOSTAR) 100 UNIT/ML injection Inject 20 Units into the skin as directed.  3 mL  3  . carvedilol (COREG) 25 MG tablet Take 1 tablet (25 mg  total) by mouth 2 (two) times daily.  60 tablet  6   Scheduled:  . aspirin EC  81 mg Oral Daily  . atorvastatin  10 mg Oral q1800  . carvedilol  6.25 mg Oral BID  . clopidogrel  75 mg Oral Daily  . furosemide  20 mg Oral Daily  . insulin aspart  0-15 Units Subcutaneous TID WC  . insulin aspart  0-5 Units Subcutaneous QHS  . lisinopril  20 mg Oral Daily  . potassium chloride SA  20 mEq Oral Daily    Assessment: 48yo male on anticoagulation for NSTEMI.  Cardiologist plans for cardiac cath on Monday 01/04/14. Heparin level 0.3 now barely in goal range. CBC WNL   Goal of Therapy:  Heparin level 0.3-0.7 units/ml Monitor platelets by anticoagulation protocol: Yes   Plan:  Increase IV heparin to 1800 units/hr Next heparin level due in AM.   Nakenya Theall S. Merilynn Finlandobertson, PharmD, BCPS Clinical Staff Pharmacist Pager [phone]  01/01/2014,4:45 PM

## 2014-01-01 NOTE — Care Management Note (Addendum)
  Page 2 of 2   01/05/2014     4:03:55 PM CARE MANAGEMENT NOTE 01/05/2014  Patient:  Edwin White, Edwin White   Account Number:  [id number]  Date Initiated:  01/01/2014  Documentation initiated by:  Zarif Rathje  Subjective/Objective Assessment:   TF from [place] with SOB, Chest pain     Action/Plan:   CM to follow for disposition needs   Anticipated DC Date:  01/04/2014   Anticipated DC Plan:  HOME/SELF CARE  In-house referral  Clinical Social Worker      DC Planning Services  CM consult      Choice offered to / List presented to:             Status of service:  Completed, signed off Medicare Important Message given?   (If response is "NO", the following Medicare IM given date fields will be blank) Date Medicare IM given:   Medicare IM given by:   Date Additional Medicare IM given:   Additional Medicare IM given by:    Discharge Disposition:  HOME/SELF CARE  Per UR Regulation:  Reviewed for med. necessity/level of care/duration of stay  If discussed at [place] of Stay Meetings, dates discussed:   01/05/2014    Comments:  Mariann Laster RN, BSN, MSHL, CCM  Nurse - Case Manager,  ([facility])  [phone]  01/05/2014 CM met with patient to discuss Medications.  Confirmed MCD in place and co-pays 3-4 dollars. [facility]. Patient has SSD in place. SW Consult completed:  see SW note for details. No other CM needs identified Disposition:  Home / self care.   Thi Klich RN, BSN, MSHL, CCM  Nurse - Case Manager,  ([phone]  01/04/2014 Left Heart Catheterization 01/04/2014 Med Review:  Plavix (MCD/[place])   Mariann Laster RN, BSN, MSHL, CCM  Nurse - Case Manager,  (Unit [phone]  01/01/2014 47yo MCD [place] [facility] - IM:  n/a c/o SOB while driving.  Transfer from [place]. NSTMEI, > elevated troponin consistent with NSTEMI. Social:  From home with sister. MEDS:  Hx/o intermittent non-compliance with meds.  [facility]. SW Consult ___________pending Disposition Plan:  Pending.

## 2014-01-01 NOTE — Progress Notes (Signed)
CRITICAL VALUE ALERT  Critical value received:  Troponin 3.24  Date of notification:  01/01/2014  Time of notification:  0300  Critical value read back:Yes.    Nurse who received alert:  Leanor KailAmor Tashe Purdon  MD notified (1st page):  Dr. Elliot CousinMatthew Whithlock  Time of first page:  0300  MD notified (2nd page):  Time of second page:  Responding MD:  Elliot CousinMatthew Whithlock  Time MD responded:  [phone]

## 2014-01-02 LAB — CBC
HEMATOCRIT: 42.9 % (ref 39.0–52.0)
Hemoglobin: 14.9 g/dL (ref 13.0–17.0)
MCH: 30.9 pg (ref 26.0–34.0)
MCHC: 34.7 g/dL (ref 30.0–36.0)
MCV: 89 fL (ref 78.0–100.0)
Platelets: 136 10*3/uL — ABNORMAL LOW (ref 150–400)
RBC: 4.82 MIL/uL (ref 4.22–5.81)
RDW: 13.4 % (ref 11.5–15.5)
WBC: 7.8 10*3/uL (ref 4.0–10.5)

## 2014-01-02 LAB — HEPARIN LEVEL (UNFRACTIONATED)
Heparin Unfractionated: 0.23 IU/mL — ABNORMAL LOW (ref 0.30–0.70)
Heparin Unfractionated: 0.41 IU/mL (ref 0.30–0.70)

## 2014-01-02 LAB — GLUCOSE, CAPILLARY
GLUCOSE-CAPILLARY: 297 mg/dL — AB (ref 70–99)
GLUCOSE-CAPILLARY: 234 mg/dL — AB (ref 70–99)
Glucose-Capillary: 251 mg/dL — ABNORMAL HIGH (ref 70–99)
Glucose-Capillary: 168 mg/dL — ABNORMAL HIGH (ref 70–99)

## 2014-01-02 MED ORDER — LISINOPRIL 20 MG PO TABS
20.0000 mg | ORAL_TABLET | Freq: Two times a day (BID) | ORAL | Status: DC
  Administered 2014-01-02 – 2014-01-05 (×6): 20 mg via ORAL
  Filled 2014-01-02 (×10): qty 1

## 2014-01-02 MED ORDER — INSULIN GLARGINE 100 UNIT/ML ~~LOC~~ SOLN
10.0000 [IU] | Freq: Every day | SUBCUTANEOUS | Status: DC
  Administered 2014-01-02 – 2014-01-05 (×4): 10 [IU] via SUBCUTANEOUS
  Filled 2014-01-02 (×4): qty 0.1

## 2014-01-02 NOTE — Progress Notes (Signed)
Patient ID: Edwin White, male   DOB: 1965/08/12, 48 y.o.   MRN: [id number]     Subjective:   No complaints overnight   Objective:   Temp:  [97.3 F (36.3 C)-98.1 F (36.7 C)] 97.3 F (36.3 C) (07/04 0618) Pulse Rate:  [83-96] 96 (07/04 0618) Resp:  [18-20] 18 (07/04 0618) BP: (122-150)/(74-101) 150/98 mmHg (07/04 0618) SpO2:  [98 %-99 %] 98 % (07/04 0618) Weight:  [229 lb 6.4 oz (104.055 kg)] 229 lb 6.4 oz (104.055 kg) (07/04 0618) Last BM Date: 12/31/13  Filed Weights   12/31/13 2214 01/01/14 0500 01/02/14 0618  Weight: 229 lb 8 oz (104.1 kg) 228 lb 13.4 oz (103.8 kg) 229 lb 6.4 oz (104.055 kg)    Intake/Output Summary (Last 24 hours) at 01/02/14 0906 Last data filed at 01/02/14 0830  Gross per 24 hour  Intake   1106 ml  Output   1275 ml  Net   -169 ml    Telemetry: NSR  Exam:  General:NAD, diskempt  Resp:CTA Bilaterally  Cardiac:RRR, no m/r/g, no JVD  NW:GNFAOZHGI:abdomen soft, NT, ND  MSK:no LE edema  Neuro: no focal deficits  Psych: appropriate affect  Lab Results:  Basic Metabolic Panel:  Recent Labs Lab 01/01/14 0444  NA 136*  K 3.3*  CL 94*  CO2 27  GLUCOSE 225*  BUN 8  CREATININE 0.85  CALCIUM 9.4    Liver Function Tests: No results found for this basename: AST, ALT, ALKPHOS, BILITOT, PROT, ALBUMIN,  in the last 168 hours  CBC:  Recent Labs Lab 01/01/14 0444 01/02/14 0404  WBC 7.2 7.8  HGB 14.2 14.9  HCT 40.8 42.9  MCV 88.5 89.0  PLT 141* 136*    Cardiac Enzymes:  Recent Labs Lab 01/01/14 0201 01/01/14 0735 01/01/14 1355  TROPONINI 3.24* 2.99* 1.50*    BNP: No results found for this basename: PROBNP,  in the last 8760 hours  Coagulation:  Recent Labs Lab 01/01/14 0444  INR 1.04    ECG: nsr  Medications:   Scheduled Medications: . aspirin EC  81 mg Oral Daily  . atorvastatin  10 mg Oral q1800  . carvedilol  6.25 mg Oral BID  . clopidogrel  75 mg Oral Daily  . furosemide  20 mg Oral Daily  . insulin  aspart  0-15 Units Subcutaneous TID WC  . insulin aspart  0-5 Units Subcutaneous QHS  . lisinopril  20 mg Oral Daily  . potassium chloride SA  20 mEq Oral Daily    Infusions: . heparin 2,100 Units/hr (01/02/14 0615)    PRN Medications: acetaminophen, nitroGLYCERIN, ondansetron (ZOFRAN) IV     Assessment/Plan   48 yo male hx of CAD s/p CABG in 2007, ICM LVEF 25-35%, DM2, mixed medical compliance, admitted with SOB.  1. NSTEMI - initial trop 1.2 that had trended up to 3.4, EKG SR with LAFB with TWI/ST depression lateral leads that are old.  - on ASA, plavix (chronically on plavix), statin, ACE, beta blocker, anticoagulation. Continue current lower dose statin as appears he is tolerating and has had significant side effects to alternative statins. - will plan for cath, given mixed history of compliance may consider BMS vs DES if reasonable from anatomic standpoint.  - patient is hemodynamically stable and pain free, given holiday weekend scheduling will plan for cath Monday unless clinical change.   2. Acute on chronic systolic heart failure - volume status improved, breathing improved. Will continue oral lasix - continue coreg and lisinopril at  current doses   Leonia ReevesGregg Taylor,M.D.

## 2014-01-02 NOTE — Progress Notes (Signed)
ANTICOAGULATION CONSULT NOTE - Follow Up Consult  Pharmacy Consult for heparin Indication: NSTEMI  Patient Measurements: Height: 6' (182.9 cm) Weight: 229 lb 6.4 oz (104.055 kg) (scale C) IBW/kg (Calculated) : 77.6 Heparin Dosing Weight: 100 kg   Labs:  Recent Labs  01/01/14 0201 01/01/14 0444  01/01/14 0735 01/01/14 1355 01/01/14 1548 01/02/14 0404 01/02/14 1100  HGB  --  14.2  --   --   --   --  14.9  --   HCT  --  40.8  --   --   --   --  42.9  --   PLT  --  141*  --   --   --   --  136*  --   LABPROT  --  13.6  --   --   --   --   --   --   INR  --  1.04  --   --   --   --   --   --   HEPARINUNFRC  --   --   < > 0.14*  --  0.30 0.23* 0.41  CREATININE  --  0.85  --   --   --   --   --   --   TROPONINI 3.24*  --   --  2.99* 1.50*  --   --   --   < > = values in this interval not displayed.  Assessment: Heparin level 0.41 is therapeutic on heparin rate 2100 units/hr in this 48 y.o male on anticoagulation for NSTEMI. CBC stable with pltc slight decrease to 136K. No bleeding noted. Plan is for cardiac cath on Monday 01/04/14.  Cardiologist notes that patient is hemodynamically stable and pain free.   Goal of Therapy:  Heparin level 0.3-0.7 units/ml   Plan:  Continue heparin gtt at current rate of  2100 units/hr Daily heparin level and CBC  Noah Delaineuth Kingson Lohmeyer, RPh Clinical Pharmacist Pager: [phone] 01/02/2014,12:59 PM

## 2014-01-02 NOTE — Progress Notes (Signed)
Patient BP going up in 154/ 106 manually, coreg given per order, MD notified extra 20mg  lisinopril  Ordered. No c/o dizziness or lightheaded. Will continue to monitor patient.

## 2014-01-02 NOTE — Progress Notes (Signed)
Restarted pt on Lantus, dose decreased 20 u to 10 u daily to avoid hypoglycemia.

## 2014-01-02 NOTE — Progress Notes (Signed)
ANTICOAGULATION CONSULT NOTE - Follow Up Consult  Pharmacy Consult for heparin Indication: NSTEMI  Labs:  Recent Labs  01/01/14 0201 01/01/14 0444 01/01/14 0735 01/01/14 1355 01/01/14 1548 01/02/14 0404  HGB  --  14.2  --   --   --  14.9  HCT  --  40.8  --   --   --  42.9  PLT  --  141*  --   --   --  136*  LABPROT  --  13.6  --   --   --   --   INR  --  1.04  --   --   --   --   HEPARINUNFRC  --   --  0.14*  --  0.30 0.23*  CREATININE  --  0.85  --   --   --   --   TROPONINI 3.24*  --  2.99* 1.50*  --   --     Assessment: 48yo male now subtherapeutic on heparin despite rate adjustment for low end of goal.  Goal of Therapy:  Heparin level 0.3-0.7 units/ml   Plan:  Will increase heparin gtt by 3 units/kg/hr to 2100 units/hr and check level in 6hr.  Vernard GamblesVeronda Daleon Willinger, PharmD, BCPS  01/02/2014,5:25 AM

## 2014-01-02 NOTE — Progress Notes (Signed)
Pt states he is short of breath this morning. Pt in No apparent distress, pt with no O2 on at moment, O2 placed on pt and O2 sat 100% on 2Lnc. Pt states he feels better with oxygen. Pt given scheduled coreg this AM with BP of 150/98 manually. Pt coughing after swallowing coreg at 45 degree HOB, pt able to swallow water with no problem overnight when sitting on side of bed. Pt states his throat has been swollen since April and he has difficulty swallowing. Pt states he has difficulty swallowing food. MD on call paged, passed on to day shift RN in report.

## 2014-01-03 LAB — CBC
HEMATOCRIT: 45.5 % (ref 39.0–52.0)
Hemoglobin: 15.7 g/dL (ref 13.0–17.0)
MCH: 30.8 pg (ref 26.0–34.0)
MCHC: 34.5 g/dL (ref 30.0–36.0)
MCV: 89.2 fL (ref 78.0–100.0)
PLATELETS: 151 10*3/uL (ref 150–400)
RBC: 5.1 MIL/uL (ref 4.22–5.81)
RDW: 13.4 % (ref 11.5–15.5)
WBC: 9.9 10*3/uL (ref 4.0–10.5)

## 2014-01-03 LAB — GLUCOSE, CAPILLARY
Glucose-Capillary: 257 mg/dL — ABNORMAL HIGH (ref 70–99)
Glucose-Capillary: 138 mg/dL — ABNORMAL HIGH (ref 70–99)
Glucose-Capillary: 184 mg/dL — ABNORMAL HIGH (ref 70–99)
Glucose-Capillary: 208 mg/dL — ABNORMAL HIGH (ref 70–99)

## 2014-01-03 LAB — HEPARIN LEVEL (UNFRACTIONATED): HEPARIN UNFRACTIONATED: 0.42 [IU]/mL (ref 0.30–0.70)

## 2014-01-03 NOTE — Progress Notes (Signed)
Pt alert and oriented, able to communicate needs, heparin stopped today due to loss of IV and restarted after IV insertion, tolerated well, ambulating hallway, NAD noted, will continue to monitor

## 2014-01-03 NOTE — Progress Notes (Signed)
Patient ID: Edwin CaveDavid W White, male   DOB: 08-20-1965, 48 y.o.   MRN: [id number]     Subjective:   No complaints overnight. "When am I going home"   Objective:   Temp:  [97.5 F (36.4 C)-98.4 F (36.9 C)] 98.1 F (36.7 C) (07/05 0431) Pulse Rate:  [89-119] 96 (07/05 0431) Resp:  [19-28] 19 (07/05 0431) BP: (148-166)/(84-108) 166/103 mmHg (07/05 0431) SpO2:  [98 %-100 %] 100 % (07/05 0431) Weight:  [229 lb 4.8 oz (104.01 kg)] 229 lb 4.8 oz (104.01 kg) (07/05 0431) Last BM Date: 01/02/14  Filed Weights   01/01/14 0500 01/02/14 0618 01/03/14 0431  Weight: 228 lb 13.4 oz (103.8 kg) 229 lb 6.4 oz (104.055 kg) 229 lb 4.8 oz (104.01 kg)    Intake/Output Summary (Last 24 hours) at 01/03/14 0859 Last data filed at 01/02/14 2319  Gross per 24 hour  Intake   1128 ml  Output   1500 ml  Net   -372 ml    Telemetry: NSR  Exam:  General:NAD, diskempt  Resp:CTA Bilaterally  Cardiac:RRR, no m/r/g, no JVD  FA:OZHYQMVGI:abdomen soft, NT, ND  MSK:no LE edema  Neuro: no focal deficits  Psych: appropriate affect  Lab Results:  Basic Metabolic Panel:  Recent Labs Lab 01/01/14 0444  NA 136*  K 3.3*  CL 94*  CO2 27  GLUCOSE 225*  BUN 8  CREATININE 0.85  CALCIUM 9.4    Liver Function Tests: No results found for this basename: AST, ALT, ALKPHOS, BILITOT, PROT, ALBUMIN,  in the last 168 hours  CBC:  Recent Labs Lab 01/01/14 0444 01/02/14 0404 01/03/14 0325  WBC 7.2 7.8 9.9  HGB 14.2 14.9 15.7  HCT 40.8 42.9 45.5  MCV 88.5 89.0 89.2  PLT 141* 136* 151    Cardiac Enzymes:  Recent Labs Lab 01/01/14 0201 01/01/14 0735 01/01/14 1355  TROPONINI 3.24* 2.99* 1.50*    BNP: No results found for this basename: PROBNP,  in the last 8760 hours  Coagulation:  Recent Labs Lab 01/01/14 0444  INR 1.04    ECG: nsr  Medications:   Scheduled Medications: . aspirin EC  81 mg Oral Daily  . atorvastatin  10 mg Oral q1800  . carvedilol  6.25 mg Oral BID  . clopidogrel   75 mg Oral Daily  . furosemide  20 mg Oral Daily  . insulin aspart  0-15 Units Subcutaneous TID WC  . insulin aspart  0-5 Units Subcutaneous QHS  . insulin glargine  10 Units Subcutaneous Daily  . lisinopril  20 mg Oral BID  . potassium chloride SA  20 mEq Oral Daily    Infusions: . heparin 2,100 Units/hr (01/02/14 1500)    PRN Medications: acetaminophen, nitroGLYCERIN, ondansetron (ZOFRAN) IV     Assessment/Plan   48 yo male hx of CAD s/p CABG in 2007, ICM LVEF 25-35%, DM2, mixed medical compliance, admitted with SOB.  1. NSTEMI - initial trop 1.2 that had trended up to 3.4, EKG SR with LAFB with TWI/ST depression lateral leads that are old.  - on ASA, plavix (chronically on plavix), statin, ACE, beta blocker, anticoagulation. Continue current lower dose statin as appears he is tolerating and has had significant side effects to alternative statins. - will plan for cath tomorrow, given mixed history of compliance may consider BMS vs DES if reasonable from anatomic standpoint.  - patient is hemodynamically stable and pain free, given holiday weekend scheduling will plan for cath Monday unless clinical change.   2.  Acute on chronic systolic heart failure - volume status improved, breathing improved. Will continue oral lasix - continue coreg and lisinopril at current doses   Edwin White,M.D.

## 2014-01-03 NOTE — Progress Notes (Signed)
ANTICOAGULATION CONSULT NOTE - Follow Up Consult  Pharmacy Consult for heparin Indication: NSTEMI  Patient Measurements: Height: 6' (182.9 cm) Weight: 229 lb 6.4 oz (104.055 kg) (scale C) IBW/kg (Calculated) : 77.6 Heparin Dosing Weight: 100 kg   Labs:  Recent Labs  01/01/14 0201  01/01/14 0444 01/01/14 0735 01/01/14 1355  01/02/14 0404 01/02/14 1100 01/03/14 0325  HGB  --   < > 14.2  --   --   --  14.9  --  15.7  HCT  --   --  40.8  --   --   --  42.9  --  45.5  PLT  --   --  141*  --   --   --  136*  --  151  LABPROT  --   --  13.6  --   --   --   --   --   --   INR  --   --  1.04  --   --   --   --   --   --   HEPARINUNFRC  --   --   --  0.14*  --   < > 0.23* 0.41 0.42  CREATININE  --   --  0.85  --   --   --   --   --   --   TROPONINI 3.24*  --   --  2.99* 1.50*  --   --   --   --   < > = values in this interval not displayed.  Assessment: Heparin level 0.42 is therapeutic on heparin rate 2100 units/hr in this 48 y.o male on anticoagulation for NSTEMI. CBC stable with pltc 151K. No bleeding noted. Plan is for cardiac cath on Monday 01/04/14.  No chest pain reported. Telemetry: NSR  Goal of Therapy:  Heparin level 0.3-0.7 units/ml   Plan:  Continue heparin gtt at current rate of  2100 units/hr Daily heparin level and CBC  Noah Delaineuth Corina Stacy, RPh Clinical Pharmacist Pager: [phone] 01/03/2014,1:10 PM

## 2014-01-04 ENCOUNTER — Encounter (HOSPITAL_COMMUNITY)
Admission: EM | Disposition: A | Discharge status: Home / Self Care | Payer: Medicaid Other | Source: Other Acute Inpatient Hospital | Attending: Cardiology

## 2014-01-04 DIAGNOSIS — I214 Non-ST elevation (NSTEMI) myocardial infarction: Secondary | ICD-10-CM

## 2014-01-04 DIAGNOSIS — I2510 Atherosclerotic heart disease of native coronary artery without angina pectoris: Secondary | ICD-10-CM

## 2014-01-04 HISTORY — PX: PERCUTANEOUS CORONARY STENT INTERVENTION (PCI-S): SHX5485

## 2014-01-04 HISTORY — PX: LEFT HEART CATHETERIZATION WITH CORONARY/GRAFT ANGIOGRAM: SHX5450

## 2014-01-04 LAB — CBC
HEMATOCRIT: 44.1 % (ref 39.0–52.0)
HEMOGLOBIN: 15.2 g/dL (ref 13.0–17.0)
MCH: 30.8 pg (ref 26.0–34.0)
MCHC: 34.5 g/dL (ref 30.0–36.0)
MCV: 89.5 fL (ref 78.0–100.0)
Platelets: 151 10*3/uL (ref 150–400)
RBC: 4.93 MIL/uL (ref 4.22–5.81)
RDW: 13.5 % (ref 11.5–15.5)
WBC: 8.1 10*3/uL (ref 4.0–10.5)

## 2014-01-04 LAB — GLUCOSE, CAPILLARY
GLUCOSE-CAPILLARY: 224 mg/dL — AB (ref 70–99)
GLUCOSE-CAPILLARY: 206 mg/dL — AB (ref 70–99)
GLUCOSE-CAPILLARY: 180 mg/dL — AB (ref 70–99)
Glucose-Capillary: 180 mg/dL — ABNORMAL HIGH (ref 70–99)
Glucose-Capillary: 174 mg/dL — ABNORMAL HIGH (ref 70–99)

## 2014-01-04 LAB — PROTIME-INR
INR: 1.02 (ref 0.00–1.49)
Prothrombin Time: 13.4 seconds (ref 11.6–15.2)

## 2014-01-04 LAB — POCT ACTIVATED CLOTTING TIME: Activated Clotting Time: 349 seconds

## 2014-01-04 LAB — POTASSIUM: Potassium: 3.7 mEq/L (ref 3.7–5.3)

## 2014-01-04 LAB — HEPARIN LEVEL (UNFRACTIONATED): Heparin Unfractionated: 0.58 IU/mL (ref 0.30–0.70)

## 2014-01-04 SURGERY — LEFT HEART CATHETERIZATION WITH CORONARY/GRAFT ANGIOGRAM
Anesthesia: LOCAL

## 2014-01-04 MED ORDER — POTASSIUM CHLORIDE CRYS ER 20 MEQ PO TBCR
20.0000 meq | EXTENDED_RELEASE_TABLET | Freq: Once | ORAL | Status: AC
  Administered 2014-01-04: 22:00:00 20 meq via ORAL
  Filled 2014-01-04: qty 1

## 2014-01-04 MED ORDER — NITROGLYCERIN 0.2 MG/ML ON CALL CATH LAB
INTRAVENOUS | Status: AC
  Filled 2014-01-04: qty 1

## 2014-01-04 MED ORDER — SODIUM CHLORIDE 0.9 % IJ SOLN: 3.0000 mL | INTRAMUSCULAR | Status: DC | PRN

## 2014-01-04 MED ORDER — ASPIRIN 81 MG PO CHEW
CHEWABLE_TABLET | ORAL | Status: AC
  Filled 2014-01-04: qty 3

## 2014-01-04 MED ORDER — FENTANYL CITRATE 0.05 MG/ML IJ SOLN
INTRAMUSCULAR | Status: AC
  Filled 2014-01-04: qty 2

## 2014-01-04 MED ORDER — FAMOTIDINE IN NACL 20-0.9 MG/50ML-% IV SOLN
INTRAVENOUS | Status: AC
  Filled 2014-01-04: qty 50

## 2014-01-04 MED ORDER — BIVALIRUDIN 250 MG IV SOLR
INTRAVENOUS | Status: AC
  Filled 2014-01-04: qty 250

## 2014-01-04 MED ORDER — HEPARIN (PORCINE) IN NACL 2-0.9 UNIT/ML-% IJ SOLN
INTRAMUSCULAR | Status: AC
  Filled 2014-01-04: qty 1000

## 2014-01-04 MED ORDER — ASPIRIN 81 MG PO CHEW
81.0000 mg | CHEWABLE_TABLET | ORAL | Status: DC
Start: 2014-01-05 — End: 2014-01-04

## 2014-01-04 MED ORDER — LIDOCAINE HCL (PF) 1 % IJ SOLN
INTRAMUSCULAR | Status: AC
  Filled 2014-01-04: qty 30

## 2014-01-04 MED ORDER — SODIUM CHLORIDE 0.9 % IJ SOLN: 3.0000 mL | Freq: Two times a day (BID) | INTRAMUSCULAR | Status: DC

## 2014-01-04 MED ORDER — SODIUM CHLORIDE 0.9 % IV SOLN
INTRAVENOUS | Status: DC
  Administered 2014-01-04: 250 mL via INTRAVENOUS

## 2014-01-04 MED ORDER — SODIUM CHLORIDE 0.9 % IV SOLN
INTRAVENOUS | Status: AC
  Administered 2014-01-04: 11:00:00 via INTRAVENOUS

## 2014-01-04 MED ORDER — FUROSEMIDE 10 MG/ML IJ SOLN: 40.0000 mg | Freq: Once | INTRAMUSCULAR | Status: DC

## 2014-01-04 MED ORDER — CLOPIDOGREL BISULFATE 300 MG PO TABS
ORAL_TABLET | ORAL | Status: AC
  Filled 2014-01-04: qty 2

## 2014-01-04 MED ORDER — SODIUM CHLORIDE 0.9 % IV SOLN: 250.0000 mL | INTRAVENOUS | Status: DC | PRN

## 2014-01-04 MED ORDER — MIDAZOLAM HCL 2 MG/2ML IJ SOLN
INTRAMUSCULAR | Status: AC
  Filled 2014-01-04: qty 2

## 2014-01-04 NOTE — Progress Notes (Signed)
UR completed Evonna Stoltz K. Connelly [place], RN, BSN, MSHL, CCM  01/04/2014 6:05 PM

## 2014-01-04 NOTE — Interval H&P Note (Signed)
History and Physical Interval Note:  01/04/2014 9:04 AM  Edwin White  has presented today for cardiac cath with the diagnosis of SOB, known CAD s/p CABG.   The various methods of treatment have been discussed with the patient and family. After consideration of risks, benefits and other options for treatment, the patient has consented to  Procedure(s): LEFT HEART CATHETERIZATION WITH CORONARY/GRAFT ANGIOGRAM (N/A) as a surgical intervention .  The patient's history has been reviewed, patient examined, no change in status, stable for surgery.  I have reviewed the patient's chart and labs.  Questions were answered to the patient's satisfaction.    Cath Lab Visit (complete for each Cath Lab visit)  Clinical Evaluation Leading to the Procedure:   ACS: Yes.    Non-ACS:    Anginal Classification: CCS II  Anti-ischemic medical therapy: No Therapy  Non-Invasive Test Results: No non-invasive testing performed  Prior CABG: Previous CABG         [name]

## 2014-01-04 NOTE — Progress Notes (Signed)
Patient received from cath lab at 1100.  Non-compliant with activity restrictions post cath; out of bed, standing and walking in room.  Taking gown off and removing tele box.  Refuses SCD and IVF. Second RN, nurse tech both attempted to explain precautions to him.  Patient shows limited awareness of situation and diminished cognition.  Also spoke to sister on phone and had her attempt to reason with patient.  Sister states he is at baseline. Cyndia DiverHao Meg, PA also called and spoke to patient.  Dr. Clifton JamesMcAlhany aware.  Patient moved to room 6C03 for visibility from nursing station.  Teen volunteer assigned to patient for added safety.  Sister states that he has refused multiple offers from family for living at their home; patient refuses, preferring to live in his [place] with his cat.  He has stated that purchasing medications is a problem.   Social Work and Care Management consults requested.

## 2014-01-04 NOTE — CV Procedure (Signed)
Cardiac Catheterization Operative Report  Leonides CaveDavid W Sopher [id number] 7/6/20159:10 AM Kirstie PeriSHAH,ASHISH, MD  Procedure Performed:  1. Left Heart Catheterization 2. Selective Coronary Angiography 3. Left ventricular angiogram 4. SVG angiography 5. LIMA graft angiography 6. PTCA/Bare metal stent mid Circumflex 7. Angioseal right femoral artery  Operator: Verne Carrowhristopher McAlhany, MD  Indication:  48 yo male with history of CAD s/p CABG in 2007, ischemic CM, DM, HTN, ongoing tobacco abuse admitted with dyspnea, NSTEMI. No chest pain.                                 Procedure Details: The risks, benefits, complications, treatment options, and expected outcomes were discussed with the patient. The patient and/or family concurred with the proposed plan, giving informed consent. The patient was brought to the cath lab after IV hydration was begun and oral premedication was given. The patient was further sedated with Versed and Fentanyl. The right groin was prepped and draped in the usual manner. Using the modified Seldinger access technique, a 5 French sheath was placed in the right femoral artery. Standard diagnostic catheters were used to perform selective coronary angiography. The JR4 was used to engage the RCA and the SVG to the OM. An RCB catheter was used to engage the SVG to PDA. An IMA catheter was used to engage the LIMA graft to LAD. A pigtail catheter was used to perform a left ventricular angiogram. I elected to proceed to PCI of the mid Circumflex.   PCI Note: Angiomax weight based bolus followed by drip. Plavix 600 mg po x 1. I engaged the left main with a XB 3.0 guiding catheter. When the ACT was over 200, I passed a Cougar IC wire down the Circumflex into the OM branch. A 2.5 x 15 mm balloon was used to pre-dilate the stenosis. A 3.5 x 12 mm Rebel bare metal stent was deployed in the mid vessel. The stent was post-dilated with a 3.75 x 8 mm [place] balloon x 1. The stenosis was taken from 99%  down to 0%. Angioseal right femoral artery.   There were no immediate complications. The patient was taken to the recovery area in stable condition.   Hemodynamic Findings: Central aortic pressure: 145/100 Left ventricular pressure: 145/25/29  Angiographic Findings:  Left main: Distal 30% stenosis.   Left Anterior Descending Artery: Large caliber vessel that courses to the apex. The ostial stent has 80% restenosis. The proximal vessel has diffuse 30% stenosis. The mid vessel has diffuse 70% stenosis followed by 100% occlusion. The mid and distal vessel fills from the patent IMA graft. The diagonal branch is a moderate caliber bifurcating vessel with diffuse 70% proximal stenosis in both the superior and inferior branches.   Circumflex Artery: Large caliber vessel with two moderate caliber obtuse marginal branches. The first OM branch is occluded and fills from the patent vein graft. The second OM branch is occluded and fills from left to left collaterals. The mid Circumflex has a 99% stenosis, supplying the mid and distal AV groove Circumflex and giving collaterals to the RCA. (Not protected by vein graft).  Right Coronary Artery: Moderate caliber co-dominant vessel with diffuse 80% mid stenosis, 90% distal stenosis. The distal vessel fills from left to right collaterals and from antegrade flow.   Graft Anatomy:  SVG to PDA is occluded SVG to OM1 is patent LIMA to mid LAD is patent  Left Ventricular Angiogram: LVEF=10-15% with global  hypokinesis.  Impression: 1. Severe triple vessel CAD s/p CABG with 2/3 patent grafts. Patent graft to LAD and OM. Circumflex is not protected by graft and has high grade mid stenosis. Diffuse RCA disease with left to right collaterals.  2. NSTEMI 3. Severe LV systolic dysfunction 4. Successful PTCA/bare metal stent mid Circumflex  Recommendations: He has a history of medication non-compliance thus bare metal stent was used. He will need at least one month  of dual anti-platelet therapy with ASA and Plavix. He has severe LV dysfunction/ischemic cardiomyopathy. Continue medical therapy. Consider ICD. Smoking cessation.        Complications:  None. The patient tolerated the procedure well.

## 2014-01-04 NOTE — Progress Notes (Signed)
Tele showed 7 bts VT at 1905, see strip.  Pt was dozing quietly, awakened easily.  BP 149/98.  Rhonda Barrett PA notified, order received, K+ 3.7, 20 meq PO KCl given.  Rt groin level 0, pt denies complaints.

## 2014-01-04 NOTE — Progress Notes (Signed)
Patient with no complaints of pain or discomfort. Ambulating in halls. IV heparin infusing.

## 2014-01-04 NOTE — H&P (View-Only) (Signed)
Patient ID: Edwin White, male   DOB: 08-20-1965, 48 y.o.   MRN: [id number]     Subjective:   No complaints overnight. "When am I going home"   Objective:   Temp:  [97.5 F (36.4 C)-98.4 F (36.9 C)] 98.1 F (36.7 C) (07/05 0431) Pulse Rate:  [89-119] 96 (07/05 0431) Resp:  [19-28] 19 (07/05 0431) BP: (148-166)/(84-108) 166/103 mmHg (07/05 0431) SpO2:  [98 %-100 %] 100 % (07/05 0431) Weight:  [229 lb 4.8 oz (104.01 kg)] 229 lb 4.8 oz (104.01 kg) (07/05 0431) Last BM Date: 01/02/14  Filed Weights   01/01/14 0500 01/02/14 0618 01/03/14 0431  Weight: 228 lb 13.4 oz (103.8 kg) 229 lb 6.4 oz (104.055 kg) 229 lb 4.8 oz (104.01 kg)    Intake/Output Summary (Last 24 hours) at 01/03/14 0859 Last data filed at 01/02/14 2319  Gross per 24 hour  Intake   1128 ml  Output   1500 ml  Net   -372 ml    Telemetry: NSR  Exam:  General:NAD, diskempt  Resp:CTA Bilaterally  Cardiac:RRR, no m/r/g, no JVD  FA:OZHYQMVGI:abdomen soft, NT, ND  MSK:no LE edema  Neuro: no focal deficits  Psych: appropriate affect  Lab Results:  Basic Metabolic Panel:  Recent Labs Lab 01/01/14 0444  NA 136*  K 3.3*  CL 94*  CO2 27  GLUCOSE 225*  BUN 8  CREATININE 0.85  CALCIUM 9.4    Liver Function Tests: No results found for this basename: AST, ALT, ALKPHOS, BILITOT, PROT, ALBUMIN,  in the last 168 hours  CBC:  Recent Labs Lab 01/01/14 0444 01/02/14 0404 01/03/14 0325  WBC 7.2 7.8 9.9  HGB 14.2 14.9 15.7  HCT 40.8 42.9 45.5  MCV 88.5 89.0 89.2  PLT 141* 136* 151    Cardiac Enzymes:  Recent Labs Lab 01/01/14 0201 01/01/14 0735 01/01/14 1355  TROPONINI 3.24* 2.99* 1.50*    BNP: No results found for this basename: PROBNP,  in the last 8760 hours  Coagulation:  Recent Labs Lab 01/01/14 0444  INR 1.04    ECG: nsr  Medications:   Scheduled Medications: . aspirin EC  81 mg Oral Daily  . atorvastatin  10 mg Oral q1800  . carvedilol  6.25 mg Oral BID  . clopidogrel   75 mg Oral Daily  . furosemide  20 mg Oral Daily  . insulin aspart  0-15 Units Subcutaneous TID WC  . insulin aspart  0-5 Units Subcutaneous QHS  . insulin glargine  10 Units Subcutaneous Daily  . lisinopril  20 mg Oral BID  . potassium chloride SA  20 mEq Oral Daily    Infusions: . heparin 2,100 Units/hr (01/02/14 1500)    PRN Medications: acetaminophen, nitroGLYCERIN, ondansetron (ZOFRAN) IV     Assessment/Plan   48 yo male hx of CAD s/p CABG in 2007, ICM LVEF 25-35%, DM2, mixed medical compliance, admitted with SOB.  1. NSTEMI - initial trop 1.2 that had trended up to 3.4, EKG SR with LAFB with TWI/ST depression lateral leads that are old.  - on ASA, plavix (chronically on plavix), statin, ACE, beta blocker, anticoagulation. Continue current lower dose statin as appears he is tolerating and has had significant side effects to alternative statins. - will plan for cath tomorrow, given mixed history of compliance may consider BMS vs DES if reasonable from anatomic standpoint.  - patient is hemodynamically stable and pain free, given holiday weekend scheduling will plan for cath Monday unless clinical change.   2.  Acute on chronic systolic heart failure - volume status improved, breathing improved. Will continue oral lasix - continue coreg and lisinopril at current doses   Kesa Birky,M.D. 

## 2014-01-04 NOTE — Progress Notes (Addendum)
Called by patient's nurse as he refuse to lay down and comply with strict bedrest. Patient just had a BMS to mild LCx via R femoral approach. He is suppose to be on bedrest, however per nursing staff, he has been ambulating in the hallway and refuse to lay down. When asked why, he replied, "just simply because I don't want to." He denies any back pain or other discomfort that would preclude him from laying down. I have discussed with him regarding risk of bleeding and other complications if he does not lay still, however patient continue to refuse to comply. He was transferred from rm 6c05 to 6c03 in front of nurses station so he will be under better observation in case something happen.   ?if related to sedation vs underlying psych issue. Observation for now.   Signed, Azalee CourseHao Pape Parson PA-C Pager: [phone]   Addendum: per patient's sister, he is homeless and sleeps in a [place].  Compliance will be an issue.

## 2014-01-04 NOTE — Progress Notes (Signed)
Noted pt NPO this am for cath & attempted to reach on call PA Katie x2 & noted PA  Arrived to floor and informed her unable to give pt meds this am before cath as pt NPO and if ok to give ASA 81mg  .  Instructed ok to do so.  Amanda PeaNellie Lylianna Fraiser, Charity fundraiserN.

## 2014-01-05 DIAGNOSIS — I209 Angina pectoris, unspecified: Secondary | ICD-10-CM

## 2014-01-05 DIAGNOSIS — I5022 Chronic systolic (congestive) heart failure: Secondary | ICD-10-CM

## 2014-01-05 DIAGNOSIS — I10 Essential (primary) hypertension: Secondary | ICD-10-CM

## 2014-01-05 DIAGNOSIS — I25810 Atherosclerosis of coronary artery bypass graft(s) without angina pectoris: Secondary | ICD-10-CM

## 2014-01-05 LAB — BASIC METABOLIC PANEL
Anion gap: 14 (ref 5–15)
BUN: 12 mg/dL (ref 6–23)
CALCIUM: 9.2 mg/dL (ref 8.4–10.5)
CO2: 24 mEq/L (ref 19–32)
CREATININE: 0.93 mg/dL (ref 0.50–1.35)
Chloride: 100 mEq/L (ref 96–112)
GFR calc Af Amer: >90 mL/min (ref >90)
GFR calc non Af Amer: >90 mL/min (ref >90)
GLUCOSE: 207 mg/dL — AB (ref 70–99)
Potassium: 4.9 mEq/L (ref 3.7–5.3)
Sodium: 138 mEq/L (ref 137–147)

## 2014-01-05 LAB — CBC
HCT: 41.6 % (ref 39.0–52.0)
Hemoglobin: 14.3 g/dL (ref 13.0–17.0)
MCH: 30.6 pg (ref 26.0–34.0)
MCHC: 34.4 g/dL (ref 30.0–36.0)
MCV: 89.1 fL (ref 78.0–100.0)
Platelets: 141 10*3/uL — ABNORMAL LOW (ref 150–400)
RBC: 4.67 MIL/uL (ref 4.22–5.81)
RDW: 13.7 % (ref 11.5–15.5)
WBC: 8 10*3/uL (ref 4.0–10.5)

## 2014-01-05 LAB — MAGNESIUM: Magnesium: 1.9 mg/dL (ref 1.5–2.5)

## 2014-01-05 LAB — GLUCOSE, CAPILLARY: Glucose-Capillary: 204 mg/dL — ABNORMAL HIGH (ref 70–99)

## 2014-01-05 MED ORDER — HEART ATTACK BOUNCING BOOK
Freq: Once | Status: AC
  Administered 2014-01-05: 01:00:00
  Filled 2014-01-05: qty 1

## 2014-01-05 MED ORDER — NITROGLYCERIN 0.4 MG SL SUBL: 0.4000 mg | SUBLINGUAL_TABLET | SUBLINGUAL | Status: DC | PRN

## 2014-01-05 MED ORDER — LISINOPRIL 20 MG PO TABS: 20.0000 mg | ORAL_TABLET | Freq: Two times a day (BID) | ORAL | Status: DC

## 2014-01-05 MED ORDER — CARVEDILOL 6.25 MG PO TABS
6.2500 mg | ORAL_TABLET | Freq: Two times a day (BID) | ORAL | Status: DC
Start: 2014-01-05 — End: 2014-08-21

## 2014-01-05 MED ORDER — HYDRALAZINE HCL 20 MG/ML IJ SOLN
10.0000 mg | Freq: Once | INTRAMUSCULAR | Status: AC
  Administered 2014-01-05: 01:00:00 10 mg via INTRAVENOUS
  Filled 2014-01-05: qty 0.5

## 2014-01-05 MED FILL — Sodium Chloride IV Soln 0.9%: INTRAVENOUS | Qty: 50 | Status: AC

## 2014-01-05 NOTE — Discharge Summary (Signed)
Physician Discharge Summary  Patient ID: Edwin CaveDavid W White MRN: [id number] DOB/AGE: 1966-05-22 48 y.o.  Admit date: 12/31/2013 Discharge date: 01/05/2014  Primary Cardiologist: Previously followed by Dr. Earnestine Leysde Gent  Admission Diagnoses: NSTEMI  Discharge Diagnoses:  Active Problems:   CAD   Ischemic cardiomyopathy   NSTEMI (non-ST elevated myocardial infarction)   Discharged Condition: stable  Hospital Course: The patient is a 48 y.o. male w/ PMHx significant for CAD s/p CABG in 2007, ischemic CMP with EF 25-35% in 2013 and DM2, who presented to [facility] on 12/31/2013 with shortness of breath and associated with mild diaphoresis. He denied chest pain. He did report intermittent compliance with medications. His EKG was without ST changes however his troponin returned elevated at 1.2. Subsequently, he was transferred to [facility] care. He was continued on his beta blocker, ACE inhibitor, aspirin, Plavix and Lipitor. IV heparin was initiated. Cardiac enzymes were re-cycled x 3 and were elevated at 3.24, 2.99 and 1.50. He underwent a left heart catheterization, perform by Dr. Clifton JamesMcAlhany. He was noted to have severe triple vessel CAD with 2/3 patent grafts (LIMA to mid LAD patent, saphenous vein graft to OM1 patent). His saphenous vein graft to the PDA was occluded. He was also noted to have diffuse RCA disease with left to right collaterals. He underwent successful PTCA/bare-metal stent to the mid circumflex. A bare-metal stent was chosen due to a history of medication noncompliance. Also notable on cath was evidence of severe left ventricular systolic function with an ejection fraction of 10-15% with global hypokinesis. Medical therapy was recommended. He left the Cath Lab in stable condition. He was continued on dual antiplatelet therapy with aspirin plus Plavix for his newly placed bare-metal stent, with recommendation of at least one month of dual antiplatelet therapy. He was  also continued on a beta blocker, ACE inhibitor and diuretic for his heart failure. It has been recommended that he have a 2-D echocardiogram after 3 months of maximal medical therapy to reassess systolic function, to determine if an ICD is warranted (EF <35%). He had no post-cath issues. His cath access site remained stable. Blood pressure, heart rate and renal function remained stable. He was last seen and examined by Dr. Katrinka BlazingSmith, who determined that he was stable for discharge home. He was discharged home on 81 mg aspirin, 75 mg of Plavix, 6.25 mg Coreg twice a day, 20 mg of lisinopril twice a day as well as 80 mg of Lasix daily, along with supplemental potassium. He was ordered to obtain a repeat basic metabolic panel in one week to reassess renal function and electrolytes. He is scheduled for post hospital followup in [place] with Joni ReiningKathryn Lawrence, NP, on 01/12/2014.   Consults: None  Significant Diagnostic Studies:   Left heart catheterization 01/04/2014 Hemodynamic Findings:  Central aortic pressure: 145/100  Left ventricular pressure: 145/25/29  Angiographic Findings:  Left main: Distal 30% stenosis.  Left Anterior Descending Artery: Large caliber vessel that courses to the apex. The ostial stent has 80% restenosis. The proximal vessel has diffuse 30% stenosis. The mid vessel has diffuse 70% stenosis followed by 100% occlusion. The mid and distal vessel fills from the patent IMA graft. The diagonal branch is a moderate caliber bifurcating vessel with diffuse 70% proximal stenosis in both the superior and inferior branches.  Circumflex Artery: Large caliber vessel with two moderate caliber obtuse marginal branches. The first OM branch is occluded and fills from the patent vein graft. The second OM branch is  occluded and fills from left to left collaterals. The mid Circumflex has a 99% stenosis, supplying the mid and distal AV groove Circumflex and giving collaterals to the RCA. (Not protected  by vein graft).  Right Coronary Artery: Moderate caliber co-dominant vessel with diffuse 80% mid stenosis, 90% distal stenosis. The distal vessel fills from left to right collaterals and from antegrade flow.  Graft Anatomy:  SVG to PDA is occluded  SVG to OM1 is patent  LIMA to mid LAD is patent  Left Ventricular Angiogram: LVEF=10-15% with global hypokinesis.   Treatments: See Hospital Course  Discharge Exam: Blood pressure 139/95, pulse 84, temperature 98.8 F (37.1 C), temperature source Oral, resp. rate 18, height 6' (1.829 m), weight 231 lb 7.7 oz (105 kg), SpO2 98.00%.   Disposition: 01-Home or Self Care      Discharge Instructions   Diet - low sodium heart healthy    Complete by:  As directed      Discharge instructions    Complete by:  As directed   Get lab work done at lab of your choice in 1 week. Take order form with you     Increase activity slowly    Complete by:  As directed             Medication List         aspirin EC 81 MG tablet  Take 81 mg by mouth daily.     atorvastatin 10 MG tablet  Commonly known as:  LIPITOR  Take 1 tablet (10 mg total) by mouth daily at 6 PM.     carvedilol 6.25 MG tablet  Commonly known as:  COREG  Take 1 tablet (6.25 mg total) by mouth 2 (two) times daily.     clopidogrel 75 MG tablet  Commonly known as:  PLAVIX  Take 1 tablet (75 mg total) by mouth daily.     furosemide 80 MG tablet  Commonly known as:  LASIX  Take 1 tablet (80 mg total) by mouth daily.     glyBURIDE 5 MG tablet  Commonly known as:  DIABETA  Take 5 mg by mouth daily.     insulin glargine 100 UNIT/ML injection  Commonly known as:  LANTUS  Inject 20 Units into the skin daily.     lisinopril 20 MG tablet  Commonly known as:  PRINIVIL,ZESTRIL  Take 1 tablet (20 mg total) by mouth 2 (two) times daily.     nitroGLYCERIN 0.4 MG SL tablet  Commonly known as:  NITROSTAT  Place 1 tablet (0.4 mg total) under the tongue every 5 (five) minutes x 3  doses as needed for chest pain.     polyethylene glycol packet  Commonly known as:  MIRALAX / GLYCOLAX  Take 17 g by mouth daily.     potassium chloride SA 20 MEQ tablet  Commonly known as:  K-DUR,KLOR-CON  Take 1 tablet (20 mEq total) by mouth daily.       Follow-up Information   Follow up with Joni ReiningKathryn Lawrence, NP On 01/12/2014. (1:30 pm (cardiology appointment).)    Specialty:  Nurse Practitioner   Contact information:   [address] [place] [address] [phone]      TIME SPENT ON DISCHARGE, INCLUDING PHYSICIAN TIME: >30 MINUTES  Signed: Robbie LisSIMMONS, Bazil Dhanani 01/05/2014, 10:10 AM

## 2014-01-05 NOTE — Progress Notes (Signed)
BP  168/99 after hydralazine.  Now 166/116, pt denies complaints, amb in halls w/ steady gait.  PO Coreg and Lisinopril given early.  Rt groin level 0.

## 2014-01-05 NOTE — Progress Notes (Signed)
[id number] Cardiac Rehab Pt hs been up ambulating in hall, denies any problems. Completed MI, stent and CHF education with pt. He is able to return most of questions correctly with teach back. I questioned pt if he was able to read. He states that he can read things that are simple. Pt had MI booklet and CHF packet. I encouraged him to weigh himself daily, discussed zones, when to call MD and 911 also sodium and fluid restrictions. He states that he sometimes forgets to take his insulin and other medications. I suggested to him to let his sister help him remember them or maybe let her keep them in her house. He lives in his [employer]van parked at his sister's home.Pt did think that was a better idea.We discussed smoking cessation and he states that he wants to quit. I gave him tips for quitting, quit smart class information and coaching  contact number. He declines Outpt. CRP, not interested and knows that he would not attend. [name], Edwin Drost G, RN 01/05/2014 9:15 AM

## 2014-01-05 NOTE — Clinical Social Work Psychosocial (Signed)
Clinical Social Work Department BRIEF PSYCHOSOCIAL ASSESSMENT 01/05/2014  Patient:  Edwin White, Edwin White     Account Number:  [id number]     Admit date:  12/31/2013  Clinical Social Worker:  Hubert Azure  Date/Time:  01/05/2014 10:41 AM  Referred by:  Physician  Date Referred:  01/05/2014 Referred for  Homelessness   Other Referral:   Interview type:  Patient Other interview type:    PSYCHOSOCIAL DATA Living Status:  OTHER Admitted from facility:   Level of care:   Primary support name:  Edwin White Primary support relationship to patient:  SIBLING Degree of support available:   Good    CURRENT CONCERNS Current Concerns  Other - See comment   Other Concerns:   Homelessness    SOCIAL WORK ASSESSMENT / PLAN CSW met with patient who was sitting in chair at bedside. CSW introduced self and explained role. CSW discussed housing situation with patient. Per patient, he lives in the home with his sister Edwin White. [place] asked patient about living in [place] in sister's driveway. Per patient, Edwin White would be able to verify he resides in the home with her. CSW asked for Opal's contact information, patient reported he does not know or have patient's number. CSW asked patient about medication compliance. Per patient, he cannot afford his medication although he receives SSI. CSW asked patient about current bills, such as rent to sister. Patient denied having bills at current time. CSW discussed budgeting and obtaining medication prior to spending money on other things. Patient did not provide a response, but began smiling. CSW asked patient about transportation. Per patient, his sister Edwin White will be picking him up as Edwin White does not drive. CSW offered patient resources, to which he declined, stating he has everything he needs. CSW made RN aware.  No further needs. CSW signing off.   Assessment/plan status:  No Further Intervention Required Other assessment/ plan:   Information/referral to  community resources:    PATIENT'S/FAMILY'S RESPONSE TO PLAN OF CARE: Patient was attentive to CSW and engaged appropriately during assessment.   [place], [place] Weekend Clinical Social Worker [phone]

## 2014-01-05 NOTE — Progress Notes (Signed)
Ready for discharge. He needs furosemide 80 mg and BMET in 1 week. Needs f/u in [place] in 1 week. Right groin is okay.

## 2014-01-05 NOTE — Progress Notes (Signed)
Patient profile: 48 yo male with history of CAD s/p CABG in 2007, ischemic CM, DM, HTN, ongoing tobacco abuse admitted with dyspnea, NSTEMI. No chest pain.   Subjective: No complaints. Denies CP/ SOB.   Objective: Vital signs in last 24 hours: Temp:  [97.2 F (36.2 C)-99.3 F (37.4 C)] 98.6 F (37 C) (07/07 0555) Pulse Rate:  [87-113] 89 (07/07 0555) Resp:  [12-22] 20 (07/07 0555) BP: (143-186)/(96-131) 166/116 mmHg (07/07 0555) SpO2:  [81 %-98 %] 97 % (07/07 0555) Weight:  [231 lb 7.7 oz (105 kg)] 231 lb 7.7 oz (105 kg) (07/07 0057) Last BM Date: 01/03/14  Intake/Output from previous day: 07/06 0701 - 07/07 0700 In: 876.7 [P.O.:365; I.V.:511.7] Out: 300 [Urine:300] Intake/Output this shift:    Medications Current Facility-Administered Medications  Medication Dose Route Frequency Provider Last Rate Last Dose  . acetaminophen (TYLENOL) tablet 650 mg  650 mg Oral Q4H PRN Ardis RowanMatthew Whitlock, MD      . aspirin EC tablet 81 mg  81 mg Oral Daily Ardis RowanMatthew Whitlock, MD   81 mg at 01/04/14 0849  . atorvastatin (LIPITOR) tablet 10 mg  10 mg Oral q1800 Ardis RowanMatthew Whitlock, MD   10 mg at 01/04/14 2139  . carvedilol (COREG) tablet 6.25 mg  6.25 mg Oral BID Ardis RowanMatthew Whitlock, MD   6.25 mg at 01/05/14 [id number]  . clopidogrel (PLAVIX) tablet 75 mg  75 mg Oral Daily Ardis RowanMatthew Whitlock, MD   75 mg at 01/03/14 0953  . furosemide (LASIX) tablet 20 mg  20 mg Oral Daily Ardis RowanMatthew Whitlock, MD   20 mg at 01/04/14 1156  . insulin aspart (novoLOG) injection 0-15 Units  0-15 Units Subcutaneous TID WC Ardis RowanMatthew Whitlock, MD   3 Units at 01/04/14 1754  . insulin aspart (novoLOG) injection 0-5 Units  0-5 Units Subcutaneous QHS Ardis RowanMatthew Whitlock, MD   2 Units at 01/03/14 2147  . insulin glargine (LANTUS) injection 10 Units  10 Units Subcutaneous Daily Joline SaltRhonda G Barrett, PA-C   10 Units at 01/04/14 1157  . lisinopril (PRINIVIL,ZESTRIL) tablet 20 mg  20 mg Oral BID Dolores Pattyaniel R Bensimhon, MD   20 mg at 01/05/14 [id number]  .  nitroGLYCERIN (NITROSTAT) SL tablet 0.4 mg  0.4 mg Sublingual Q5 Min x 3 PRN Ardis RowanMatthew Whitlock, MD      . ondansetron [facility](ZOFRAN) injection 4 mg  4 mg Intravenous Q6H PRN Ardis RowanMatthew Whitlock, MD      . potassium chloride SA (K-DUR,KLOR-CON) CR tablet 20 mEq  20 mEq Oral Daily Ardis RowanMatthew Whitlock, MD   20 mEq at 01/04/14 1155    PE: General appearance: alert, cooperative and no distress Neck: no carotid bruit and no JVD Lungs: faint bibasilar crackles Heart: regular rate and rhythm Extremities: no LEE Pulses: 2+ and symmetric Skin: warm and dry Neurologic: Grossly normal  Lab Results:   Recent Labs  01/03/14 0325 01/04/14 0257 01/05/14 0510  WBC 9.9 8.1 8.0  HGB 15.7 15.2 14.3  HCT 45.5 44.1 41.6  PLT 151 151 141*   BMET  Recent Labs  01/04/14 2036 01/05/14 0510  NA  --  138  K 3.7 4.9  CL  --  100  CO2  --  24  GLUCOSE  --  207*  BUN  --  12  CREATININE  --  0.93  CALCIUM  --  9.2   PT/INR  Recent Labs  01/04/14 0709  LABPROT 13.4  INR 1.02    Studies/Results:  Left heart catheterization:   Angiographic Findings:  Left main:  Distal 30% stenosis.  Left Anterior Descending Artery: Large caliber vessel that courses to the apex. The ostial stent has 80% restenosis. The proximal vessel has diffuse 30% stenosis. The mid vessel has diffuse 70% stenosis followed by 100% occlusion. The mid and distal vessel fills from the patent IMA graft. The diagonal branch is a moderate caliber bifurcating vessel with diffuse 70% proximal stenosis in both the superior and inferior branches.  Circumflex Artery: Large caliber vessel with two moderate caliber obtuse marginal branches. The first OM branch is occluded and fills from the patent vein graft. The second OM branch is occluded and fills from left to left collaterals. The mid Circumflex has a 99% stenosis, supplying the mid and distal AV groove Circumflex and giving collaterals to the RCA. (Not protected by vein graft).  Right  Coronary Artery: Moderate caliber co-dominant vessel with diffuse 80% mid stenosis, 90% distal stenosis. The distal vessel fills from left to right collaterals and from antegrade flow.  Graft Anatomy:  SVG to PDA is occluded  SVG to OM1 is patent  LIMA to mid LAD is patent  Left Ventricular Angiogram: LVEF=10-15% with global hypokinesis.   Assessment/Plan  Active Problems:   CAD   Ischemic cardiomyopathy   NSTEMI (non-ST elevated myocardial infarction)  1. NSTEMI: s/p BMS to mid circumflex. Continue dual antiplatelet therapy with aspirin plus Plavix for at least one month.  2. Left ventricular dysfunction/ischemic cardiomyopathy: EF 10-15% with global hypokinesis. Continue medical therapy with beta blocker and ACE inhibitor. He is currently on 6.25 mg of Coreg twice a day and 20 mg of lisinopril twice a day. He is moderately hypertensive with systolic pressures in the 160s. Heart rate in the upper 80s. Can further titrate Coreg to 12.5 mg twice a day. Continue daily Lasix. He will need a repeat 2-D echocardiogram as an outpatient to reassess LV function after 3 months of maximum medical therapy. If his EF remains less than 35%, he may be a candidate for an ICD. Question temporary protection with the LifeVest.    LOS: 5 days    Rohail Klees M. Sharol HarnessSimmons, PA-C 01/05/2014 7:30 AM

## 2014-01-05 NOTE — Progress Notes (Signed)
BP 170/110.  Dr Don BroachYousuf informed, orders received.  Pt dozing quietly, awakened easily. BP 149/108 at time of hydralazine IV given.  Pt demonstrates very little understanding of medication teaching attempted.  Flat affect but mostly cooperative tonight.  Rt groin level 0.

## 2014-01-06 NOTE — Discharge Summary (Signed)
He is well this morning and there is no chest pain or dyspnea. Need to better control BP. Would resume his usual dose of furosemide 80 mg daily. Needs 1 week clinical f/u to reassess BP and renal function and encourage compliance.

## 2014-01-11 ENCOUNTER — Other Ambulatory Visit: Payer: Self-pay | Admitting: *Deleted

## 2014-01-11 DIAGNOSIS — I10 Essential (primary) hypertension: Secondary | ICD-10-CM

## 2014-01-11 DIAGNOSIS — I5022 Chronic systolic (congestive) heart failure: Secondary | ICD-10-CM

## 2014-01-11 NOTE — Progress Notes (Signed)
HPI: Mr.Christine is a 48 year old patient previously followed by Dr. Andee Lineman to be est. with either Dr. Beulah Gandy or Dr. Wyline Mood, we are following for ongoing assessment and management of CAD, status post CABG in 2007, ischemic cardiomyopathy with an EF of 25-30% in 2013, who was recently discharged from [facility] on 01/05/2014 after presenting to [facility] with shortness of breath and diaphoresis. He was found to have a non-ST elevation MI.  The patient had cardiac catheterization and Dr. Clifton James revealing severe triple vessel CAD with 2 of 3 patent grafts (LIMA to mid LAD patent, saphenous vein graft to OM1 patent. His saphenous vein graft to PDA was occluded) he underwent successful PTCA with a bare-metal stent to the midcircumflex. A bare-metal stent was chosen due to history of medication noncompliance. He was also noted to have severe left ventricular systolic dysfunction with an EF of 10-15% with global hypokinesis. He was to be treated medically and is continued on dual antiplatelet therapy with aspirin and Plavix for newly placed bare-metal stent with recommendation to continue for a minimum one month.  The patient was also sent home on beta blocker, ACE inhibitor and diuretic for his heart failure. He was to have a followup 2-D echocardiogram and October of 2015 for reassessment systolic function. A followup BMET is to be ordered. Reportedly, the patient has not been taking his medications as directed with the exception of the Plavix and carvedilol. He states he cannot afford the lisinopril furosemide statin, insulin or potassium. At home approximately one week. He has had no recurrence of chest pain but is having significant fluid retention.  Allergies  Allergen Reactions  . Pravastatin Other (See Comments)    PT CANNOT NOT WALK OR MOVE.    Current Outpatient Prescriptions  Medication Sig Dispense Refill  . carvedilol (COREG) 6.25 MG tablet Take 1 tablet (6.25 mg total) by  mouth 2 (two) times daily.  60 tablet  5  . clopidogrel (PLAVIX) 75 MG tablet Take 1 tablet (75 mg total) by mouth daily.  30 tablet  3  . aspirin EC 81 MG tablet Take 81 mg by mouth daily.      Marland Kitchen atorvastatin (LIPITOR) 10 MG tablet Take 1 tablet (10 mg total) by mouth daily at 6 PM.  30 tablet  3  . furosemide (LASIX) 80 MG tablet Take 1 tablet (80 mg total) by mouth daily.  30 tablet  3  . furosemide (LASIX) 80 MG tablet Take 1 tablet (80 mg total) by mouth daily.  30 tablet  3  . glyBURIDE (DIABETA) 5 MG tablet Take 5 mg by mouth daily.       . insulin glargine (LANTUS) 100 UNIT/ML injection Inject 20 Units into the skin daily.      Marland Kitchen lisinopril (PRINIVIL,ZESTRIL) 20 MG tablet Take 1 tablet (20 mg total) by mouth 2 (two) times daily.  60 tablet  5  . lisinopril (PRINIVIL,ZESTRIL) 20 MG tablet Take 1 tablet (20 mg total) by mouth 2 (two) times daily.  60 tablet  5  . nitroGLYCERIN (NITROSTAT) 0.4 MG SL tablet Place 1 tablet (0.4 mg total) under the tongue every 5 (five) minutes x 3 doses as needed for chest pain.  25 tablet  2  . polyethylene glycol (MIRALAX / GLYCOLAX) packet Take 17 g by mouth daily.       . potassium chloride SA (K-DUR,KLOR-CON) 20 MEQ tablet Take 1 tablet (20 mEq total) by mouth daily.  30 tablet  3  .  potassium chloride SA (K-DUR,KLOR-CON) 20 MEQ tablet Take 1 tablet (20 mEq total) by mouth daily.  30 tablet  3   No current facility-administered medications for this visit.    Past Medical History  Diagnosis Date  . CHF (congestive heart failure)     No recurrent admissions for heart failure.  . Ischemic cardiomyopathy     Ejection fraction improved from 15%-20% to 40%-45% in 2012 by echocardiogram  . Coronary artery disease     Status post coronary bypass grafting in 2007  . History of medication noncompliance   . Moderate mitral regurgitation by prior echocardiogram   . Post-infarction apical thrombus     Anticoagulation discontinued  . Crescendo transient  ischemic attacks   . Hyperlipidemia   . Gastroparesis     Gastric emptying study scheduled  . Functional bowel disorder     Bloating and abdominal cramping as well as constipation requiring lubiprostone  . Hypertension     Poorly controlled  . Heart attack September 13, 2004    Followed by bypass surgery  . NSTEMI (non-ST elevated myocardial infarction) 12/31/2013  . Type 2 diabetes mellitus   . GERD (gastroesophageal reflux disease)   . Stroke 04/2012    denies residual on 12/31/2013    Past Surgical History  Procedure Laterality Date  . Coronary artery bypass graft  2007    "CABG X5"  . Coronary angioplasty with stent placement  2006    ROS: Review of systems complete and found to be negative unless listed above  PHYSICAL EXAM BP 152/92  Pulse 84  Ht 6' (1.829 m)  Wt 218 lb (98.884 kg)  BMI 29.56 kg/m2 General: Well developed, well nourished, in no acute distress Head: Eyes PERRLA, No xanthomas.   Normal cephalic and atramatic  Lungs: Clear bilaterally to auscultation and percussion. Heart: HRRR S1 S2, distant without MRG.  Pulses are 2+ & equal.            No carotid bruit. No JVD.  No abdominal bruits. No femoral bruits. Abdomen: Bowel sounds are positive, abdomen soft and non-tender without masses or                  Hernia's noted. Msk:  Back normal, normal gait. Normal strength and tone for age. Extremities: No clubbing, cyanosis 2+ lower extremity edema.  DP +1 Neuro: Alert and oriented X 3. Psych:  Good affect, responds appropriately   ASSESSMENT AND PLAN

## 2014-01-12 ENCOUNTER — Encounter: Payer: Self-pay | Admitting: Adult Health

## 2014-01-12 ENCOUNTER — Ambulatory Visit (INDEPENDENT_AMBULATORY_CARE_PROVIDER_SITE_OTHER): Payer: Medicaid Other | Admitting: Adult Health

## 2014-01-12 VITALS — BP 152/92 | HR 84 | Ht 72.0 in | Wt 218.0 lb

## 2014-01-12 DIAGNOSIS — I10 Essential (primary) hypertension: Secondary | ICD-10-CM

## 2014-01-12 DIAGNOSIS — I255 Ischemic cardiomyopathy: Secondary | ICD-10-CM

## 2014-01-12 DIAGNOSIS — I2589 Other forms of chronic ischemic heart disease: Secondary | ICD-10-CM

## 2014-01-12 DIAGNOSIS — I5022 Chronic systolic (congestive) heart failure: Secondary | ICD-10-CM

## 2014-01-12 DIAGNOSIS — I2510 Atherosclerotic heart disease of native coronary artery without angina pectoris: Secondary | ICD-10-CM

## 2014-01-12 MED ORDER — LISINOPRIL 20 MG PO TABS: 20.0000 mg | ORAL_TABLET | Freq: Two times a day (BID) | ORAL | Status: DC

## 2014-01-12 MED ORDER — FUROSEMIDE 80 MG PO TABS: 80.0000 mg | ORAL_TABLET | Freq: Every day | ORAL | Status: DC

## 2014-01-12 MED ORDER — POTASSIUM CHLORIDE CRYS ER 20 MEQ PO TBCR: 20.0000 meq | EXTENDED_RELEASE_TABLET | Freq: Every day | ORAL | Status: DC

## 2014-01-12 NOTE — Assessment & Plan Note (Signed)
I placed a Edwin vest on this patient. I have explained its use, and necessity. Edwin S. representative will be contacting him for placement of vest. He will restart ACE inhibitor Lasix continue carvedilol and adhere to a low-sodium diet.

## 2014-01-12 NOTE — Assessment & Plan Note (Signed)
The patient is hypertensive today with an EF of 15%, he is very hypertensive for his systolic function. I have given him some water and he is taking his medications here in the office, and Hemoccult take off his antihypertensive [facility] way [facility]. He has been advised on low sodium diet verbally and in writing.

## 2014-01-12 NOTE — Patient Instructions (Signed)
Your physician recommends that you schedule a follow-up appointment in: 2 weeks   Your physician recommends that you continue on your current medications as directed. Please refer to the Current Medication list given to you today.   We are completing a form for you to be able to wear a life vest.  Low-Sodium Eating Plan Sodium raises blood pressure and causes water to be held in the body. Getting less sodium from food will help lower your blood pressure, reduce any swelling, and protect your heart, liver, and kidneys. We get sodium by adding salt (sodium chloride) to food. Most of our sodium comes from canned, boxed, and frozen foods. Restaurant foods, fast foods, and pizza are also very high in sodium. Even if you take medicine to lower your blood pressure or to reduce fluid in your body, getting less sodium from your food is important. WHAT IS MY PLAN? Most people should limit their sodium intake to 2,300 mg a day. Your health care provider recommends that you limit your sodium intake to __________ a day.  WHAT DO I NEED TO KNOW ABOUT THIS EATING PLAN? For the low-sodium eating plan, you will follow these general guidelines:  Choose foods with a % Daily Value for sodium of less than 5% (as listed on the food label).   Use salt-free seasonings or herbs instead of table salt or sea salt.   Check with your health care provider or pharmacist before using salt substitutes.   Eat fresh foods.  Eat more vegetables and fruits.  Limit canned vegetables. If you do use them, rinse them well to decrease the sodium.   Limit cheese to 1 oz (28 g) per day.   Eat lower-sodium products, often labeled as "lower sodium" or "no salt added."  Avoid foods that contain monosodium glutamate (MSG). MSG is sometimes added to [place] food and some canned foods.  Check food labels (Nutrition Facts labels) on foods to learn how much sodium is in one serving.  Eat more home-cooked food and less  restaurant, buffet, and fast food.  When eating at a restaurant, ask that your food be prepared with less salt or none, if possible.  HOW DO I READ FOOD LABELS FOR SODIUM INFORMATION? The Nutrition Facts label lists the amount of sodium in one serving of the food. If you eat more than one serving, you must multiply the listed amount of sodium by the number of servings. Food labels may also identify foods as:  Sodium free--Less than 5 mg in a serving.  Very low sodium--35 mg or less in a serving.  Low sodium--140 mg or less in a serving.  Light in sodium--50% less sodium in a serving. For example, if a food that usually has 300 mg of sodium is changed to become light in sodium, it will have 150 mg of sodium.  Reduced sodium--25% less sodium in a serving. For example, if a food that usually has 400 mg of sodium is changed to reduced sodium, it will have 300 mg of sodium. WHAT FOODS CAN I EAT? Grains Low-sodium cereals, including oats, puffed wheat and rice, and shredded wheat cereals. Low-sodium crackers. Unsalted rice and pasta. Lower-sodium bread.  Vegetables Frozen or fresh vegetables. Low-sodium or reduced-sodium canned vegetables. Low-sodium or reduced-sodium tomato sauce and paste. Low-sodium or reduced-sodium tomato and vegetable juices.  Fruits Fresh, frozen, and canned fruit. Fruit juice.  Meat and Other Protein Products Low-sodium canned tuna and salmon. Fresh or frozen meat, poultry, seafood, and fish. Lamb. Unsalted  nuts. Dried beans, peas, and lentils without added salt. Unsalted canned beans. Homemade soups without salt. Eggs.  Dairy Milk. Soy milk. Ricotta cheese. Low-sodium or reduced-sodium cheeses. Yogurt.  Condiments Fresh and dried herbs and spices. Salt-free seasonings. Onion and garlic powders. Low-sodium varieties of mustard and ketchup. Lemon juice.  Fats and Oils Reduced-sodium salad dressings. Unsalted butter.  Other Unsalted popcorn and pretzels.    The items listed above may not be a complete list of recommended foods or beverages. Contact your dietitian for more options. WHAT FOODS ARE NOT RECOMMENDED? Grains Instant hot cereals. Bread stuffing, pancake, and biscuit mixes. Croutons. Seasoned rice or pasta mixes. Noodle soup cups. Boxed or frozen macaroni and cheese. Self-rising flour. Regular salted crackers. Vegetables Regular canned vegetables. Regular canned tomato sauce and paste. Regular tomato and vegetable juices. Frozen vegetables in sauces. Salted french fries. Olives. Rosita Fire. Relishes. Sauerkraut. Salsa. Meat and Other Protein Products Salted, canned, smoked, spiced, or pickled meats, seafood, or fish. Bacon, ham, sausage, hot dogs, corned beef, chipped beef, and packaged luncheon meats. Salt pork. Jerky. Pickled herring. Anchovies, regular canned tuna, and sardines. Salted nuts. Dairy Processed cheese and cheese spreads. Cheese curds. Blue cheese and cottage cheese. Buttermilk.  Condiments Onion and garlic salt, seasoned salt, table salt, and sea salt. Canned and packaged gravies. Worcestershire sauce. Tartar sauce. Barbecue sauce. Teriyaki sauce. Soy sauce, including reduced sodium. Steak sauce. Fish sauce. Oyster sauce. Cocktail sauce. Horseradish. Regular ketchup and mustard. Meat flavorings and tenderizers. Bouillon cubes. Hot sauce. Tabasco sauce. Marinades. Taco seasonings. Relishes. Fats and Oils Regular salad dressings. Salted butter. Margarine. Ghee. Bacon fat.  Other Potato and tortilla chips. Corn chips and puffs. Salted popcorn and pretzels. Canned or dried soups. Pizza. Frozen entrees and pot pies.  The items listed above may not be a complete list of foods and beverages to avoid. Contact your dietitian for more information. Document Released: 12/08/2001 Document Revised: 06/23/2013 Document Reviewed: 04/22/2013 [facility] Patient Information 2015 [place], [place]. This information is not intended to replace  advice given to you by your health care provider. Make sure you discuss any questions you have with your health care provider.

## 2014-01-12 NOTE — Progress Notes (Deleted)
Name: Edwin White    DOB: 03-14-1966  Age: 48 y.o.  MR#: [id number]       PCP:  Kirstie Peri, MD      Insurance: Payor: MEDICAID [place] / Plan: MEDICAID [place] ACCESS / Product Type: *No Product type* /   CC:    Chief Complaint  Patient presents with  . Coronary Artery Disease  . Cardiomyopathy    VS Filed Vitals:   01/12/14 1315  BP: 152/92  Pulse: 84  Height: 6' (1.829 m)  Weight: 218 lb (98.884 kg)    Weights Current Weight  01/12/14 218 lb (98.884 kg)  01/05/14 231 lb 7.7 oz (105 kg)  01/05/14 231 lb 7.7 oz (105 kg)    Blood Pressure  BP Readings from Last 3 Encounters:  01/12/14 152/92  01/05/14 139/95  01/05/14 139/95     Admit date:  (Not on file) Last encounter with RMR:  Visit date not found   Allergy Pravastatin  Current Outpatient Prescriptions  Medication Sig Dispense Refill  . carvedilol (COREG) 6.25 MG tablet Take 1 tablet (6.25 mg total) by mouth 2 (two) times daily.  60 tablet  5  . clopidogrel (PLAVIX) 75 MG tablet Take 1 tablet (75 mg total) by mouth daily.  30 tablet  3  . aspirin EC 81 MG tablet Take 81 mg by mouth daily.      Marland Kitchen atorvastatin (LIPITOR) 10 MG tablet Take 1 tablet (10 mg total) by mouth daily at 6 PM.  30 tablet  3  . furosemide (LASIX) 80 MG tablet Take 1 tablet (80 mg total) by mouth daily.  30 tablet  3  . glyBURIDE (DIABETA) 5 MG tablet Take 5 mg by mouth daily.       . insulin glargine (LANTUS) 100 UNIT/ML injection Inject 20 Units into the skin daily.      Marland Kitchen lisinopril (PRINIVIL,ZESTRIL) 20 MG tablet Take 1 tablet (20 mg total) by mouth 2 (two) times daily.  60 tablet  5  . nitroGLYCERIN (NITROSTAT) 0.4 MG SL tablet Place 1 tablet (0.4 mg total) under the tongue every 5 (five) minutes x 3 doses as needed for chest pain.  25 tablet  2  . polyethylene glycol (MIRALAX / GLYCOLAX) packet Take 17 g by mouth daily.       . potassium chloride SA (K-DUR,KLOR-CON) 20 MEQ tablet Take 1 tablet (20 mEq total) by mouth daily.  30 tablet  3    No current facility-administered medications for this visit.    Discontinued Meds:   There are no discontinued medications.  Patient Active Problem List   Diagnosis Date Noted  . NSTEMI (non-ST elevated myocardial infarction) 12/31/2013  . Hypokalemia 04/21/2012  . Thrombocytopenia 04/20/2012  . TIA (transient ischemic attack) 04/19/2012  . Functional bowel disorder   . Gastroparesis   . Ischemic cardiomyopathy   . Coronary artery disease   . Hypertension   . DIAB W/O MENTION COMP TYPE II/UNS TYPE UNCNTRL 05/24/2010  . Pure hypercholesterolemia 05/24/2010  . NONDEPENDENT TOBACCO USE DISORDER 05/24/2010  . 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 CAD 05/24/2010  . CHRONIC SYSTOLIC HEART FAILURE 05/24/2010  . PVD WITH CLAUDICATION 05/24/2010  . CHRONIC AIRWAY OBSTRUCTION NEC 05/24/2010  . ESOPHAGEAL REFLUX 05/24/2010  . PERS HX NONCOMPLIANCE W/MED TX PRS HAZARDS HLTH 05/24/2010  . POSTSURGICAL AORTOCORONARY BYPASS STATUS 05/24/2010    LABS    Component Value Date/Time   NA 138 01/05/2014 0510   NA 136* 01/01/2014 0444   NA  140 04/23/2012 0510   K 4.9 01/05/2014 0510   K 3.7 01/04/2014 2036   K 3.3* 01/01/2014 0444   CL 100 01/05/2014 0510   CL 94* 01/01/2014 0444   CL 101 04/23/2012 0510   CO2 24 01/05/2014 0510   CO2 27 01/01/2014 0444   CO2 30 04/23/2012 0510   GLUCOSE 207* 01/05/2014 0510   GLUCOSE 225* 01/01/2014 0444   GLUCOSE 190* 04/23/2012 0510   BUN 12 01/05/2014 0510   BUN 8 01/01/2014 0444   BUN 10 04/23/2012 0510   CREATININE 0.93 01/05/2014 0510   CREATININE 0.85 01/01/2014 0444   CREATININE 0.93 04/23/2012 0510   CALCIUM 9.2 01/05/2014 0510   CALCIUM 9.4 01/01/2014 0444   CALCIUM 9.4 04/23/2012 0510   GFRNONAA >90 01/05/2014 0510   GFRNONAA >90 01/01/2014 0444   GFRNONAA >90 04/23/2012 0510   GFRAA >90 01/05/2014 0510   GFRAA >90 01/01/2014 0444   GFRAA >90 04/23/2012 0510   CMP     Component Value Date/Time   NA 138 01/05/2014 0510   K 4.9 01/05/2014 0510   CL 100 01/05/2014 0510    CO2 24 01/05/2014 0510   GLUCOSE 207* 01/05/2014 0510   BUN 12 01/05/2014 0510   CREATININE 0.93 01/05/2014 0510   CALCIUM 9.2 01/05/2014 0510   PROT 7.6 04/19/2012 1810   ALBUMIN 4.4 04/19/2012 1810   AST 29 04/19/2012 1810   ALT 17 04/19/2012 1810   ALKPHOS 75 04/19/2012 1810   BILITOT 0.5 04/19/2012 1810   GFRNONAA >90 01/05/2014 0510   GFRAA >90 01/05/2014 0510       Component Value Date/Time   WBC 8.0 01/05/2014 0510   WBC 8.1 01/04/2014 0257   WBC 9.9 01/03/2014 0325   HGB 14.3 01/05/2014 0510   HGB 15.2 01/04/2014 0257   HGB 15.7 01/03/2014 0325   HCT 41.6 01/05/2014 0510   HCT 44.1 01/04/2014 0257   HCT 45.5 01/03/2014 0325   MCV 89.1 01/05/2014 0510   MCV 89.5 01/04/2014 0257   MCV 89.2 01/03/2014 0325    Lipid Panel     Component Value Date/Time   CHOL 209* 01/01/2014 0201   TRIG 218* 01/01/2014 0201   HDL 38* 01/01/2014 0201   CHOLHDL 5.5 01/01/2014 0201   VLDL 44* 01/01/2014 0201   LDLCALC 127* 01/01/2014 0201    ABG    Component Value Date/Time   TCO2 25 04/19/2012 1813     No results found for this basename: TSH   BNP (last 3 results) No results found for this basename: PROBNP,  in the last 8760 hours Cardiac Panel (last 3 results) No results found for this basename: CKTOTAL, CKMB, TROPONINI, RELINDX,  in the last 72 hours  Iron/TIBC/Ferritin/ %Sat No results found for this basename: iron, tibc, ferritin, ironpctsat     EKG Orders placed during the hospital encounter of 12/31/13  . EKG 12-LEAD  . EKG 12-LEAD  . EKG 12-LEAD  . EKG 12-LEAD  . EKG 12-LEAD  . EKG 12-LEAD  . EKG 12-LEAD  . EKG 12-LEAD  . EKG 12-LEAD  . EKG 12-LEAD  . EKG 12-LEAD     Prior Assessment and Plan Problem List as of 01/12/2014     Cardiovascular and Mediastinum   Old myocardial infarction   CAD   Last Assessment & Plan   04-17-11 Office Visit Written 04-17-11 10:50 AM by June LeapGuy E de Gent, MD     No recurrent chest pain. However the patient bypass surgery  2007 and he may need in the nuclear perfusion  study prior to consideration of ICD implantation.    CHRONIC SYSTOLIC HEART FAILURE   Last Assessment & Plan   2011/02/08 Office Visit Written 02/08/11 10:49 AM by June Leap, MD     Significant LV dysfunction despite prior revascularization therapy. The patient is currently NYHA class II. he denies any chest pain or shortness of breath at rest. We will change his heart failure regimen and switch him to carvedilol initially 6.25 mg by mouth twice a day and then increase to 12.5 mg twice a day. He will also be scheduled for formal echocardiogram to properly assess wall motion abnormality and LV function. Aspirin can be decreased to 81 mg a day and Plavix continued.    PVD WITH CLAUDICATION   Last Assessment & Plan   08-Feb-2011 Office Visit Written 2011-02-08 10:51 AM by June Leap, MD     The patient likely has peripheral polyneuropathy from diabetes versus pseudo-claudication and he has an appointment scheduled with neurology. ABIs have been done last year which showed no significant peripheral vascular disease.    POSTSURGICAL AORTOCORONARY BYPASS STATUS   Ischemic cardiomyopathy   Coronary artery disease   Hypertension   TIA (transient ischemic attack)   NSTEMI (non-ST elevated myocardial infarction)     Respiratory   CHRONIC AIRWAY OBSTRUCTION NEC     Digestive   ESOPHAGEAL REFLUX   Functional bowel disorder   Gastroparesis     Endocrine   DIAB W/O MENTION COMP TYPE II/UNS TYPE UNCNTRL     Other   Pure hypercholesterolemia   NONDEPENDENT TOBACCO USE DISORDER   PERS HX NONCOMPLIANCE W/MED TX PRS HAZARDS HLTH   Thrombocytopenia   Hypokalemia       Imaging: No results found.

## 2014-01-12 NOTE — Assessment & Plan Note (Signed)
He is status post bare-metal stent placement to the mid circumflex, has been taking Plavix and carvedilol as directed, but not other medications. The patient has a significantly depressed ejection fraction of 10-15% with global hypokinesis. He was not sent home on all life vest.  I have talked at length with the patient concerning his risk factors for recurrent MI due to medical noncompliance. He has not yet taken his carvedilol and Plavix, and is only take the carvedilol once a day. I reeducated the patient on taking his medications, including frequency. A life vest will be placed on the patient. I explained to him the high likelihood that he could have recurrent heart attack, should he be medically noncompliant. He verbalizes understanding. [facility] for reevaluation of his compliance, symptoms, and for continued close observation. He may benefit from establishment at the heart failure clinic [place], we will defer that discussion to followup visit depending upon his compliance with medication regimen that he is on right now. I have given prescriptions for lisinopril, atorvastatin, Lasix 80 mg daily, potassium 20 mEq daily, he is to follow up with his primary care physician concerning diabetic management.

## 2014-01-12 NOTE — Assessment & Plan Note (Signed)
The patient appears to be fluid overloaded with the significant lower extremity edema, but his lung sounds did not appear to be a congested without wheezes. He will begin taking his Lasix as directed along with potassium, I have given him a new prescription for this. He will need to pick it up on the way home. I have counseled him strongly concerning medical compliance. Will be seen again in 2 weeks in [place]

## 2014-01-26 ENCOUNTER — Ambulatory Visit (INDEPENDENT_AMBULATORY_CARE_PROVIDER_SITE_OTHER): Payer: Medicaid Other | Admitting: Cardiovascular Disease

## 2014-01-26 ENCOUNTER — Encounter: Payer: Self-pay | Admitting: Cardiovascular Disease

## 2014-01-26 VITALS — BP 108/68 | HR 85 | Ht 72.0 in | Wt 224.0 lb

## 2014-01-26 DIAGNOSIS — Z9111 Patient's noncompliance with dietary regimen: Secondary | ICD-10-CM

## 2014-01-26 DIAGNOSIS — I2589 Other forms of chronic ischemic heart disease: Secondary | ICD-10-CM

## 2014-01-26 DIAGNOSIS — I2510 Atherosclerotic heart disease of native coronary artery without angina pectoris: Secondary | ICD-10-CM

## 2014-01-26 DIAGNOSIS — I5022 Chronic systolic (congestive) heart failure: Secondary | ICD-10-CM

## 2014-01-26 DIAGNOSIS — Z733 Stress, not elsewhere classified: Secondary | ICD-10-CM

## 2014-01-26 DIAGNOSIS — Z91199 Patient's noncompliance with other medical treatment and regimen due to unspecified reason: Secondary | ICD-10-CM

## 2014-01-26 DIAGNOSIS — I255 Ischemic cardiomyopathy: Secondary | ICD-10-CM

## 2014-01-26 DIAGNOSIS — Z658 Other specified problems related to psychosocial circumstances: Secondary | ICD-10-CM

## 2014-01-26 DIAGNOSIS — I10 Essential (primary) hypertension: Secondary | ICD-10-CM

## 2014-01-26 DIAGNOSIS — Z9119 Patient's noncompliance with other medical treatment and regimen: Secondary | ICD-10-CM

## 2014-01-26 DIAGNOSIS — E785 Hyperlipidemia, unspecified: Secondary | ICD-10-CM

## 2014-01-26 NOTE — Patient Instructions (Signed)
Your physician recommends that you continue on your current medications as directed. Please refer to the Current Medication list given to you today.  [facility]. They will contact you within 72 hours.  Thank you for choosing [facility]!!

## 2014-01-26 NOTE — Progress Notes (Signed)
Patient ID: Edwin White, male   DOB: 20-Oct-1965, 48 y.o.   MRN: [id number]      SUBJECTIVE: The patient presents for followup for coronary artery disease, ischemic myopathy, essential hypertension, and chronic systolic heart failure. He saw K. Lawrence NP approximately two weeks ago, and was noted to be noncompliant with treatment regimen.  A Life Vest was ordered at that time.  He has a history of CABG in 2007. He was hospitalized earlier this month for a non-STEMI and underwent coronary angiography which demonstrated severe triple vessel CAD with 2/3 patent grafts (LIMA to mid LAD patent, saphenous vein graft to OM1 patent). His saphenous vein graft to the PDA was occluded. He was also noted to have diffuse RCA disease with left to right collaterals. He underwent successful PTCA/bare-metal stent to the mid circumflex. A bare-metal stent was chosen due to a history of medication noncompliance. Also notable on cath was evidence of severe left ventricular systolic function with an ejection fraction of 10-15% with global hypokinesis. He was discharged on dual antiplatelet therapy with aspirin plus Plavix for his newly placed bare-metal stent, with recommendation of at least one month of dual antiplatelet therapy. He was also discharged on a beta blocker, ACE inhibitor and diuretic for his heart failure.  All his medications were refilled by K. Lawrence and the importance of taking each of them was emphasized.  Wt 218 at that visit, 224 lbs today.  He currently denies chest pain, shortness of breath, lightheadedness, palpitations, dizziness, orthopnea, leg swelling and paroxysmal nocturnal dyspnea.  He said he is taking all his medications as prescribed.  He is unemployed and receives a Radio producer.  Allergies  Allergen Reactions  . Pravastatin Other (See Comments)    PT CANNOT NOT WALK OR MOVE.    Current Outpatient Prescriptions  Medication Sig Dispense Refill  . aspirin EC 81  MG tablet Take 81 mg by mouth daily.      . carvedilol (COREG) 6.25 MG tablet Take 1 tablet (6.25 mg total) by mouth 2 (two) times daily.  60 tablet  5  . clopidogrel (PLAVIX) 75 MG tablet Take 1 tablet (75 mg total) by mouth daily.  30 tablet  3  . furosemide (LASIX) 80 MG tablet Take 1 tablet (80 mg total) by mouth daily.  30 tablet  3  . gabapentin (NEURONTIN) 400 MG capsule Take 400 mg by mouth 2 (two) times daily.      . insulin glargine (LANTUS) 100 UNIT/ML injection Inject 20 Units into the skin daily.      Marland Kitchen lisinopril (PRINIVIL,ZESTRIL) 20 MG tablet Take 1 tablet (20 mg total) by mouth 2 (two) times daily.  60 tablet  5  . NIFEdipine (PROCARDIA-XL/ADALAT-CC/NIFEDICAL-XL) 30 MG 24 hr tablet Take 30 mg by mouth daily.      . nitroGLYCERIN (NITROSTAT) 0.4 MG SL tablet Place 1 tablet (0.4 mg total) under the tongue every 5 (five) minutes x 3 doses as needed for chest pain.  25 tablet  2  . polyethylene glycol (MIRALAX / GLYCOLAX) packet Take 17 g by mouth daily.       . potassium chloride SA (K-DUR,KLOR-CON) 20 MEQ tablet Take 1 tablet (20 mEq total) by mouth daily.  30 tablet  3  . sertraline (ZOLOFT) 50 MG tablet Take 50 mg by mouth daily.      Marland Kitchen atorvastatin (LIPITOR) 10 MG tablet Take 20 mg by mouth daily at 6 PM.      . glyBURIDE (  DIABETA) 5 MG tablet Take 5 mg by mouth daily.        No current facility-administered medications for this visit.    Past Medical History  Diagnosis Date  . CHF (congestive heart failure)     No recurrent admissions for heart failure.  . Ischemic cardiomyopathy     Ejection fraction improved from 15%-20% to 40%-45% in 2012 by echocardiogram  . Coronary artery disease     Status post coronary bypass grafting in 2007  . History of medication noncompliance   . Moderate mitral regurgitation by prior echocardiogram   . Post-infarction apical thrombus     Anticoagulation discontinued  . Crescendo transient ischemic attacks   . Hyperlipidemia   .  Gastroparesis     Gastric emptying study scheduled  . Functional bowel disorder     Bloating and abdominal cramping as well as constipation requiring lubiprostone  . Hypertension     Poorly controlled  . Heart attack September 13, 2004    Followed by bypass surgery  . NSTEMI (non-ST elevated myocardial infarction) 12/31/2013  . Type 2 diabetes mellitus   . GERD (gastroesophageal reflux disease)   . Stroke 04/2012    denies residual on 12/31/2013    Past Surgical History  Procedure Laterality Date  . Coronary artery bypass graft  2007    "CABG X5"  . Coronary angioplasty with stent placement  2006    History   Social History  . Marital Status: Legally Separated    Spouse Name: N/A    Number of Children: N/A  . Years of Education: N/A   Occupational History  . Not on file.   Social History Main Topics  . Smoking status: Current Every Day Smoker -- 1.00 packs/day for 36 years    Types: Cigarettes  . Smokeless tobacco: Never Used  . Alcohol Use: Yes     Comment: "quit drinking alcohol in 2006"  . Drug Use: No  . Sexual Activity: Not Currently   Other Topics Concern  . Not on file   Social History Narrative   Disabled.      Filed Vitals:   01/26/14 0954  BP: 108/68  Pulse: 85  Height: 6' (1.829 m)  Weight: 224 lb (101.606 kg)    PHYSICAL EXAM General: NAD Neck: No JVD, no thyromegaly. Lungs: Clear to auscultation bilaterally with normal respiratory effort. CV: Nondisplaced PMI.  Regular rate and rhythm, normal S1/S2, no S3/S4, no murmur. No pretibial or periankle edema.  No carotid bruit.   Abdomen: Soft, nontender, no hepatosplenomegaly, no distention.  Neurologic: Alert and oriented x 3.  Psych: Normal affect. Extremities: No clubbing or cyanosis.   ECG: reviewed and available in electronic records.      ASSESSMENT AND PLAN: 1. CAD with recent PCI: Symptomatically stable. Continue aspirin, Plavix, carvedilol, Lipitor and lisinopril.  2. Ischemic  cardiomyopathy/chronic systolic heart failure: Appears compensated. Wearing LifeVest. Will continue Coreg, lisinopril, Lasix 80 mg daily along with potassium supplementation. An echocardiogram will be repeated in 3 months to assess for interval improvement in systolic function.  3. Essential HTN: Low normal today. Will discontinue nifedipine.  4. Hyperlipidemia: Continue Lipitor 80 mg daily.  5. Social: [facility] given his apparent inability to comply with treatment regimen.  Dispo: f/u 3 months.  Prentice DockerSuresh Levoy Geisen, M.D., F.A.C.C.

## 2014-05-03 ENCOUNTER — Other Ambulatory Visit: Payer: Self-pay | Admitting: Adult Health

## 2014-06-10 ENCOUNTER — Encounter (HOSPITAL_COMMUNITY): Payer: Self-pay | Admitting: Cardiovascular Disease

## 2014-08-11 ENCOUNTER — Telehealth: Payer: Self-pay | Admitting: Cardiovascular Disease

## 2014-08-11 NOTE — Telephone Encounter (Signed)
Left detailed message for Marisue IvanLiz from Home care asking her to have pt take medication as we prescribed :lisinopril 4562m g twice a day, and Nifedipine 30 mg daily.I also informed her we spoke with his sister and she made fu apt in march to see Dr Purvis SheffieldKoneswaran    I called [facility],[place] just last week.  His lisinopril is 20 mg bid and he has picked every month except December

## 2014-08-11 NOTE — Telephone Encounter (Signed)
[facility]Liz with [facility] called with pt's elevated BP readings.Pt was overdue since Oct to see you.I went over his med list and he is only taking lisinopril 20 mg daily vs BID, and his nifedipine is only 10 mg daily vs 30 mg ordered.Aurther Lofterry is calling sister to make fu apt Do you want me to instruct home care nurse to have pt take the meds as we prescribed  He has apt on 09/14/14 with you scheduled  FYI to Dr Purvis SheffieldKoneswaran

## 2014-08-11 NOTE — Telephone Encounter (Signed)
Yes, please have him take meds as prescribed.

## 2014-08-11 NOTE — Telephone Encounter (Signed)
BP issues  Nurse with [facility]  150/98 170/104

## 2014-08-21 ENCOUNTER — Other Ambulatory Visit: Payer: Self-pay | Admitting: Cardiology

## 2014-08-21 ENCOUNTER — Other Ambulatory Visit: Payer: Self-pay | Admitting: Adult Health

## 2014-09-14 ENCOUNTER — Ambulatory Visit: Payer: Medicaid Other | Admitting: Cardiovascular Disease

## 2014-09-14 ENCOUNTER — Encounter: Payer: Self-pay | Admitting: *Deleted

## 2014-10-21 ENCOUNTER — Other Ambulatory Visit: Payer: Self-pay | Admitting: Cardiovascular Disease

## 2014-11-21 ENCOUNTER — Emergency Department (HOSPITAL_COMMUNITY)
Admission: EM | Admit: 2014-11-21 | Discharge: 2014-11-21 | Disposition: A | Discharge status: Home / Self Care | Payer: Medicaid Other | Attending: Emergency Medicine | Admitting: Emergency Medicine

## 2014-11-21 ENCOUNTER — Emergency Department (HOSPITAL_COMMUNITY): Payer: Medicaid Other

## 2014-11-21 ENCOUNTER — Encounter (HOSPITAL_COMMUNITY): Payer: Self-pay | Admitting: *Deleted

## 2014-11-21 DIAGNOSIS — E785 Hyperlipidemia, unspecified: Secondary | ICD-10-CM | POA: Diagnosis not present

## 2014-11-21 DIAGNOSIS — Z794 Long term (current) use of insulin: Secondary | ICD-10-CM | POA: Insufficient documentation

## 2014-11-21 DIAGNOSIS — Z720 Tobacco use: Secondary | ICD-10-CM | POA: Diagnosis not present

## 2014-11-21 DIAGNOSIS — Z8673 Personal history of transient ischemic attack (TIA), and cerebral infarction without residual deficits: Secondary | ICD-10-CM | POA: Diagnosis not present

## 2014-11-21 DIAGNOSIS — Z8669 Personal history of other diseases of the nervous system and sense organs: Secondary | ICD-10-CM | POA: Insufficient documentation

## 2014-11-21 DIAGNOSIS — R0602 Shortness of breath: Secondary | ICD-10-CM | POA: Insufficient documentation

## 2014-11-21 DIAGNOSIS — R062 Wheezing: Secondary | ICD-10-CM | POA: Diagnosis not present

## 2014-11-21 DIAGNOSIS — Z79899 Other long term (current) drug therapy: Secondary | ICD-10-CM | POA: Diagnosis not present

## 2014-11-21 DIAGNOSIS — Z7982 Long term (current) use of aspirin: Secondary | ICD-10-CM | POA: Insufficient documentation

## 2014-11-21 DIAGNOSIS — I2510 Atherosclerotic heart disease of native coronary artery without angina pectoris: Secondary | ICD-10-CM | POA: Diagnosis not present

## 2014-11-21 DIAGNOSIS — I252 Old myocardial infarction: Secondary | ICD-10-CM | POA: Diagnosis not present

## 2014-11-21 DIAGNOSIS — Z7902 Long term (current) use of antithrombotics/antiplatelets: Secondary | ICD-10-CM | POA: Diagnosis not present

## 2014-11-21 DIAGNOSIS — Z8719 Personal history of other diseases of the digestive system: Secondary | ICD-10-CM | POA: Insufficient documentation

## 2014-11-21 DIAGNOSIS — Z951 Presence of aortocoronary bypass graft: Secondary | ICD-10-CM | POA: Diagnosis not present

## 2014-11-21 DIAGNOSIS — E119 Type 2 diabetes mellitus without complications: Secondary | ICD-10-CM | POA: Diagnosis not present

## 2014-11-21 DIAGNOSIS — I509 Heart failure, unspecified: Secondary | ICD-10-CM | POA: Diagnosis not present

## 2014-11-21 DIAGNOSIS — I10 Essential (primary) hypertension: Secondary | ICD-10-CM | POA: Diagnosis not present

## 2014-11-21 DIAGNOSIS — Z9889 Other specified postprocedural states: Secondary | ICD-10-CM | POA: Insufficient documentation

## 2014-11-21 LAB — BASIC METABOLIC PANEL
Anion gap: 10 (ref 5–15)
BUN: 10 mg/dL (ref 6–20)
CO2: 25 mmol/L (ref 22–32)
Calcium: 9.4 mg/dL (ref 8.9–10.3)
Chloride: 101 mmol/L (ref 101–111)
Creatinine, Ser: 1.14 mg/dL (ref 0.61–1.24)
GFR calc non Af Amer: >60 mL/min (ref >60)
Glucose, Bld: 228 mg/dL — ABNORMAL HIGH (ref 65–99)
POTASSIUM: 3.7 mmol/L (ref 3.5–5.1)
Sodium: 136 mmol/L (ref 135–145)

## 2014-11-21 LAB — CBC WITH DIFFERENTIAL/PLATELET
BASOS ABS: 0 10*3/uL (ref 0.0–0.1)
BASOS PCT: 0 % (ref 0–1)
Eosinophils Absolute: 0 10*3/uL (ref 0.0–0.7)
Eosinophils Relative: 0 % (ref 0–5)
HEMATOCRIT: 41.8 % (ref 39.0–52.0)
Hemoglobin: 14.5 g/dL (ref 13.0–17.0)
LYMPHS PCT: 21 % (ref 12–46)
Lymphs Abs: 1.7 10*3/uL (ref 0.7–4.0)
MCH: 30.7 pg (ref 26.0–34.0)
MCHC: 34.7 g/dL (ref 30.0–36.0)
MCV: 88.4 fL (ref 78.0–100.0)
Monocytes Absolute: 0.4 10*3/uL (ref 0.1–1.0)
Monocytes Relative: 5 % (ref 3–12)
NEUTROS PCT: 74 % (ref 43–77)
Neutro Abs: 5.7 10*3/uL (ref 1.7–7.7)
Platelets: 172 10*3/uL (ref 150–400)
RBC: 4.73 MIL/uL (ref 4.22–5.81)
RDW: 13.5 % (ref 11.5–15.5)
WBC: 7.8 10*3/uL (ref 4.0–10.5)

## 2014-11-21 LAB — BRAIN NATRIURETIC PEPTIDE: B Natriuretic Peptide: 194 pg/mL — ABNORMAL HIGH (ref 0.0–100.0)

## 2014-11-21 LAB — TROPONIN I: Troponin I: 0.03 ng/mL (ref <0.031)

## 2014-11-21 NOTE — Discharge Instructions (Signed)
Please call your doctor for a followup appointment within 24-48 hours. When you talk to your doctor please let them know that you were seen in the emergency department and have them acquire all of your records so that they can discuss the findings with you and formulate a treatment plan to fully care for your new and ongoing problems. ° °

## 2014-11-21 NOTE — ED Provider Notes (Signed)
CSN: [id number]     Arrival date & time 11/21/14  1411 History   First MD Initiated Contact with Patient 11/21/14 1428     Chief Complaint  Patient presents with  . Shortness of Breath     (Consider location/radiation/quality/duration/timing/severity/associated sxs/prior Treatment) HPI Comments: The patient is a 49 year old male, per the medical record he has chronically noncompliant with his medications however in July 2015 he developed a non-ST elevation myocardial infarction after having previous history of severe coronary disease requiring bypass grafting. When he followed up in the office in July it was noted that his recent echocardiogram showed severe systolic dysfunction and ejection fraction of 10-15% and global hypokinesis, he was stented in his left circumflex and was noted to have collateral flow after having diffuse right coronary disease and the saphenous vein graft to the PDA that was occluded. He was discharged on dual antiplatelet therapy with aspirin and Plavix. He was also prescribed a life vest. There has been no follow-up with the cardiologist since that time. He states that he woke this morning with shortness of breath at 4:30 AM, this is worse with the supine position, it is been persistent, better when he sits up and associated with chronic lower extremity swelling. He has occasional coughing, he still smokes cigarettes and has been told that he has COPD and was seen at an outside hospital this morning where he was prescribed an inhaler and told that he had increased fluid on his lungs. The results of that visit including the documentation do not accompany the patient. At this time his symptoms are persistent, mild to moderate, not associated with fevers abdominal pain chest pain or rashes.  Patient is a 49 y.o. male presenting with shortness of breath. The history is provided by the patient and medical records.  Shortness of Breath   Past Medical History  Diagnosis Date  .  CHF (congestive heart failure)     No recurrent admissions for heart failure.  . Ischemic cardiomyopathy     Ejection fraction improved from 15%-20% to 40%-45% in 2012 by echocardiogram  . Coronary artery disease     Status post coronary bypass grafting in 2007  . History of medication noncompliance   . Moderate mitral regurgitation by prior echocardiogram   . Post-infarction apical thrombus     Anticoagulation discontinued  . Crescendo transient ischemic attacks   . Hyperlipidemia   . Gastroparesis     Gastric emptying study scheduled  . Functional bowel disorder     Bloating and abdominal cramping as well as constipation requiring lubiprostone  . Hypertension     Poorly controlled  . Heart attack September 13, 2004    Followed by bypass surgery  . NSTEMI (non-ST elevated myocardial infarction) 12/31/2013  . Type 2 diabetes mellitus   . GERD (gastroesophageal reflux disease)   . Stroke 04/2012    denies residual on 12/31/2013   Past Surgical History  Procedure Laterality Date  . Coronary artery bypass graft  2007    "CABG X5"  . Coronary angioplasty with stent placement  2006  . Left heart catheterization with coronary/graft angiogram N/A 01/04/2014    Procedure: LEFT HEART CATHETERIZATION WITH Isabel CapriceORONARY/GRAFT ANGIOGRAM;  Surgeon: Kathleene Hazelhristopher D McAlhany, MD;  [facility];  Service: Cardiovascular;  Laterality: N/A;  . Percutaneous coronary stent intervention (pci-s)  01/04/2014    Procedure: PERCUTANEOUS CORONARY STENT INTERVENTION (PCI-S);  Surgeon: Kathleene Hazelhristopher D McAlhany, MD;  [facility];  Service: Cardiovascular;;   Family  History  Problem Relation Age of Onset  . Coronary artery disease Father    History  Substance Use Topics  . Smoking status: Current Every Day Smoker -- 1.00 packs/day for 36 years    Types: Cigarettes  . Smokeless tobacco: Never Used  . Alcohol Use: Yes     Comment: "quit drinking alcohol in 2006"    Review of Systems  Respiratory:  Positive for shortness of breath.   All other systems reviewed and are negative.     Allergies  Pravastatin  Home Medications   Prior to Admission medications   Medication Sig Start Date End Date Taking? Authorizing Provider  aspirin EC 81 MG tablet Take 81 mg by mouth daily.    Historical Provider, MD  atorvastatin (LIPITOR) 10 MG tablet Take 20 mg by mouth daily at 6 PM. 04/23/12   Laveda Norman, MD  carvedilol (COREG) 6.25 MG tablet TAKE 1 TABLET BY MOUTH TWICE DAILY 08/24/14   Laqueta Linden, MD  clopidogrel (PLAVIX) 75 MG tablet Take 1 tablet (75 mg total) by mouth daily. 04/23/12   Laveda Norman, MD  furosemide (LASIX) 80 MG tablet Take 1 tablet (80 mg total) by mouth daily. 01/12/14   Jodelle Gross, NP  furosemide (LASIX) 80 MG tablet TAKE 1 TABLET BY MOUTH DAILY. 10/21/14   Laqueta Linden, MD  gabapentin (NEURONTIN) 400 MG capsule Take 400 mg by mouth 2 (two) times daily.    Historical Provider, MD  glyBURIDE (DIABETA) 5 MG tablet Take 5 mg by mouth daily.     Historical Provider, MD  insulin glargine (LANTUS) 100 UNIT/ML injection Inject 20 Units into the skin daily. 04/23/12   Laveda Norman, MD  lisinopril (PRINIVIL,ZESTRIL) 20 MG tablet Take 1 tablet (20 mg total) by mouth 2 (two) times daily. 01/12/14   Jodelle Gross, NP  lisinopril (PRINIVIL,ZESTRIL) 20 MG tablet TAKE 1 TABLET BY MOUTH 2 TIMES DAILY. 08/23/14   Laqueta Linden, MD  NIFEdipine (PROCARDIA-XL/ADALAT-CC/NIFEDICAL-XL) 30 MG 24 hr tablet Take 30 mg by mouth daily.    Historical Provider, MD  nitroGLYCERIN (NITROSTAT) 0.4 MG SL tablet Place 1 tablet (0.4 mg total) under the tongue every 5 (five) minutes x 3 doses as needed for chest pain. 01/05/14   Brittainy Sherlynn Carbon, PA-C  polyethylene glycol (MIRALAX / GLYCOLAX) packet Take 17 g by mouth daily.     Historical Provider, MD  potassium chloride SA (K-DUR,KLOR-CON) 20 MEQ tablet Take 1 tablet (20 mEq total) by mouth daily. 01/12/14   Jodelle Gross, NP   potassium chloride SA (K-DUR,KLOR-CON) 20 MEQ tablet TAKE 1 TABLET BY MOUTH DAILY. 10/21/14   Laqueta Linden, MD  sertraline (ZOLOFT) 50 MG tablet Take 50 mg by mouth daily.    Historical Provider, MD   BP 151/81 mmHg  Pulse 95  Temp(Src) 98.8 F (37.1 C) (Oral)  Resp 28  Ht 6' (1.829 m)  Wt 245 lb (111.131 kg)  BMI 33.22 kg/m2  SpO2 94% Physical Exam  Constitutional: He appears well-developed and well-nourished. No distress.  HENT:  Head: Normocephalic and atraumatic.  Mouth/Throat: Oropharynx is clear and moist. No oropharyngeal exudate.  Eyes: Conjunctivae and EOM are normal. Pupils are equal, round, and reactive to light. Right eye exhibits no discharge. Left eye exhibits no discharge. No scleral icterus.  Neck: Normal range of motion. Neck supple. No JVD present. No thyromegaly present.  Cardiovascular: Normal rate, regular rhythm, normal heart sounds and intact distal pulses.  Exam reveals no gallop and no friction rub.   No murmur heard. Pulmonary/Chest: Effort normal. No respiratory distress. He has wheezes ( mild wheezign on expiration). He has no rales.  Mild tachypnea  Abdominal: Soft. Bowel sounds are normal. He exhibits no distension and no mass. There is no tenderness.  Musculoskeletal: Normal range of motion. He exhibits edema (bilateral lower extremity edema). He exhibits no tenderness.  Lymphadenopathy:    He has no cervical adenopathy.  Neurological: He is alert. Coordination normal.  Skin: Skin is warm and dry. No rash noted. No erythema.  Psychiatric: He has a normal mood and affect. His behavior is normal.  Nursing note and vitals reviewed.   ED Course  Procedures (including critical care time) Labs Review Labs Reviewed  BASIC METABOLIC PANEL - Abnormal; Notable for the following:    Glucose, Bld 228 (*)    All other components within normal limits  BRAIN NATRIURETIC PEPTIDE - Abnormal; Notable for the following:    B Natriuretic Peptide 194.0 (*)     All other components within normal limits  CBC WITH DIFFERENTIAL/PLATELET  TROPONIN I    Imaging Review Dg Chest Portable 1 View  11/21/2014   CLINICAL DATA:  Shortness of breath.  Open heart surgery 2007.  EXAM: PORTABLE CHEST - 1 VIEW  COMPARISON:  11/21/2014 at [facility]  FINDINGS: Decreased lung volumes compared to the recent comparison examination. Heart size is accentuated by the low lung volumes. Trachea is midline. No pulmonary edema or focal airspace disease.  IMPRESSION: Low lung volumes without focal disease.   Electronically Signed   By: Richarda Overlie M.D.   On: 11/21/2014 15:04    ED ECG REPORT  I personally interpreted this EKG   Date: 11/21/2014   Rate: 102  Rhythm: sinus tachycardia  QRS Axis: left  Intervals: normal  ST/T Wave abnormalities: nonspecific T wave changes  Conduction Disutrbances:none  Narrative Interpretation: LVH with repol abnormalities  Old EKG Reviewed: unchanged   MDM   Final diagnoses:  SOB (shortness of breath)    The patient is an extremely poor historian, much of the rule out upon history comes from the chart and the cardiology notes from July 2015. He will need further workup for the source of his symptoms, his EKG shows a left ventricular hypertrophy with a sinus tachycardia, this is not terribly changed from prior EKGs, chest x-ray has been ordered, labs to evaluate for the source of his symptoms whether pulmonary edema or COPD or pneumonia. These would be the most likely culprits, he appears hemodynamically stable at this time, he is moderately hypertensive, afebrile, oxygen of 96% on room air.  VS normal - pt has no respiratory distress and no abnormal lungs sounds - there are no rales, and minmal peripheral edema which he states he has chronically -   Xray neg, Labs with normal BNP and normal trop Stable for d/c - likely has some element of his COPD with mild wheezing. Has been Rx meds for this at OSH.   Eber Hong, MD 11/21/14 [phone]

## 2014-11-21 NOTE — ED Notes (Signed)
Pt states SOB began at 0430. States CP for began a few min PTA, lasting a few minutes. Denies pain at present.

## 2014-11-21 NOTE — ED Notes (Signed)
MD at bedside. 

## 2014-11-22 ENCOUNTER — Other Ambulatory Visit: Payer: Self-pay | Admitting: Cardiovascular Disease

## 2014-12-20 ENCOUNTER — Other Ambulatory Visit: Payer: Self-pay | Admitting: Cardiovascular Disease

## 2015-01-19 ENCOUNTER — Other Ambulatory Visit: Payer: Self-pay | Admitting: Cardiovascular Disease

## 2015-04-13 ENCOUNTER — Other Ambulatory Visit: Payer: Self-pay | Admitting: Cardiovascular Disease

## 2015-04-25 ENCOUNTER — Other Ambulatory Visit: Payer: Self-pay | Admitting: Adult Health

## 2015-04-25 ENCOUNTER — Other Ambulatory Visit: Payer: Self-pay | Admitting: Cardiovascular Disease

## 2015-05-05 ENCOUNTER — Encounter: Payer: Self-pay | Admitting: Cardiovascular Disease

## 2015-05-05 ENCOUNTER — Ambulatory Visit (INDEPENDENT_AMBULATORY_CARE_PROVIDER_SITE_OTHER): Payer: Medicaid Other | Admitting: Cardiovascular Disease

## 2015-05-05 VITALS — BP 166/102 | HR 96 | Ht 72.0 in | Wt 223.0 lb

## 2015-05-05 DIAGNOSIS — I2510 Atherosclerotic heart disease of native coronary artery without angina pectoris: Secondary | ICD-10-CM | POA: Diagnosis not present

## 2015-05-05 DIAGNOSIS — I5022 Chronic systolic (congestive) heart failure: Secondary | ICD-10-CM

## 2015-05-05 DIAGNOSIS — Z91199 Patient's noncompliance with other medical treatment and regimen due to unspecified reason: Secondary | ICD-10-CM

## 2015-05-05 DIAGNOSIS — Z9111 Patient's noncompliance with dietary regimen: Secondary | ICD-10-CM

## 2015-05-05 DIAGNOSIS — E785 Hyperlipidemia, unspecified: Secondary | ICD-10-CM

## 2015-05-05 DIAGNOSIS — I10 Essential (primary) hypertension: Secondary | ICD-10-CM

## 2015-05-05 MED ORDER — CARVEDILOL 12.5 MG PO TABS: 12.5000 mg | ORAL_TABLET | Freq: Two times a day (BID) | ORAL | Status: DC

## 2015-05-05 MED ORDER — ATORVASTATIN CALCIUM 20 MG PO TABS: 20.0000 mg | ORAL_TABLET | Freq: Every day | ORAL | Status: DC

## 2015-05-05 NOTE — Progress Notes (Signed)
Patient ID: Edwin CaveDavid W Kaufman, male   DOB: Jun 10, 1966, 49 y.o.   MRN: [id number]      SUBJECTIVE: The patient is long past due for follow-up. He is chronically noncompliant with medication treatment regimen. He denies chest pain and shortness of breath. He is only taking aspirin, metoprolol, Coreg, and nifedipine. Unclear as to why he is taking both Coreg and metoprolol. Says all other meds make him urinate. Continues to smoke.   Review of Systems: As per "subjective", otherwise negative.  Allergies  Allergen Reactions  . Pravastatin Other (See Comments)    PT CANNOT NOT WALK OR MOVE.    Current Outpatient Prescriptions  Medication Sig Dispense Refill  . aspirin EC 81 MG tablet Take 81 mg by mouth daily.    . carvedilol (COREG) 6.25 MG tablet TAKE 1 TABLET BY MOUTH TWICE DAILY 60 tablet 6  . gabapentin (NEURONTIN) 400 MG capsule Take 400 mg by mouth 2 (two) times daily.    . insulin glargine (LANTUS) 100 UNIT/ML injection Inject 20 Units into the skin daily.    . metoprolol tartrate (LOPRESSOR) 25 MG tablet TAKE 1 TABLET BY MOUTH TWICE DAILY 60 tablet 3  . NIFEdipine (PROCARDIA-XL/ADALAT-CC/NIFEDICAL-XL) 30 MG 24 hr tablet Take 30 mg by mouth daily.    Marland Kitchen. atorvastatin (LIPITOR) 10 MG tablet Take 20 mg by mouth daily at 6 PM.    . clopidogrel (PLAVIX) 75 MG tablet TAKE 1 TABLET BY MOUTH EVERY DAY (Patient not taking: Reported on 05/05/2015) 30 tablet 3  . furosemide (LASIX) 80 MG tablet Take 1 tablet (80 mg total) by mouth daily. (Patient not taking: Reported on 05/05/2015) 30 tablet 3  . glyBURIDE (DIABETA) 5 MG tablet Take 5 mg by mouth daily.     Marland Kitchen. lisinopril (PRINIVIL,ZESTRIL) 20 MG tablet Take 1 tablet (20 mg total) by mouth 2 (two) times daily. (Patient not taking: Reported on 05/05/2015) 60 tablet 5  . lisinopril (PRINIVIL,ZESTRIL) 20 MG tablet TAKE 1 TABLET BY MOUTH TWICE DAILY (Patient not taking: Reported on 05/05/2015) 60 tablet 6  . nitroGLYCERIN (NITROSTAT) 0.4 MG SL tablet Place  1 tablet (0.4 mg total) under the tongue every 5 (five) minutes x 3 doses as needed for chest pain. (Patient not taking: Reported on 05/05/2015) 25 tablet 2  . polyethylene glycol (MIRALAX / GLYCOLAX) packet Take 17 g by mouth daily.     . potassium chloride SA (K-DUR,KLOR-CON) 20 MEQ tablet Take 1 tablet (20 mEq total) by mouth daily. (Patient not taking: Reported on 05/05/2015) 30 tablet 3  . potassium chloride SA (K-DUR,KLOR-CON) 20 MEQ tablet TAKE 1 TABLET BY MOUTH DAILY - PATIENT NEEDS TO SCHEDULE AN APPOINTMENT FOR FURTHER REFILLS (Patient not taking: Reported on 05/05/2015) 30 tablet 3  . sertraline (ZOLOFT) 50 MG tablet Take 50 mg by mouth daily.     No current facility-administered medications for this visit.    Past Medical History  Diagnosis Date  . CHF (congestive heart failure) (HCC)     No recurrent admissions for heart failure.  . Ischemic cardiomyopathy     Ejection fraction improved from 15%-20% to 40%-45% in 2012 by echocardiogram  . Coronary artery disease     Status post coronary bypass grafting in 2007  . History of medication noncompliance   . Moderate mitral regurgitation by prior echocardiogram   . Post-infarction apical thrombus (HCC)     Anticoagulation discontinued  . Crescendo transient ischemic attacks   . Hyperlipidemia   . Gastroparesis  Gastric emptying study scheduled  . Functional bowel disorder     Bloating and abdominal cramping as well as constipation requiring lubiprostone  . Hypertension     Poorly controlled  . Heart attack [facility]) September 13, 2004    Followed by bypass surgery  . NSTEMI (non-ST elevated myocardial infarction) (HCC) 12/31/2013  . Type 2 diabetes mellitus (HCC)   . GERD (gastroesophageal reflux disease)   . Stroke [facility]) 04/2012    denies residual on 12/31/2013    Past Surgical History  Procedure Laterality Date  . Coronary artery bypass graft  2007    "CABG X5"  . Coronary angioplasty with stent placement  2006  . Left heart  catheterization with coronary/graft angiogram N/A 01/04/2014    Procedure: LEFT HEART CATHETERIZATION WITH Isabel Caprice;  Surgeon: Kathleene Hazel, MD;  Location: [facility] CATH LAB;  Service: Cardiovascular;  Laterality: N/A;  . Percutaneous coronary stent intervention (pci-s)  01/04/2014    Procedure: PERCUTANEOUS CORONARY STENT INTERVENTION (PCI-S);  Surgeon: Kathleene Hazel, MD;  Location: [facility] CATH LAB;  Service: Cardiovascular;;    Social History   Social History  . Marital Status: Legally Separated    Spouse Name: N/A  . Number of Children: N/A  . Years of Education: N/A   Occupational History  . Not on file.   Social History Main Topics  . Smoking status: Current Every Day Smoker -- 1.00 packs/day for 36 years    Types: Cigarettes    Start date: 05/05/1979  . Smokeless tobacco: Never Used  . Alcohol Use: 0.0 oz/week    0 Standard drinks or equivalent per week     Comment: "quit drinking alcohol in 2006"  . Drug Use: No  . Sexual Activity: Not Currently   Other Topics Concern  . Not on file   Social History Narrative   Disabled.      Filed Vitals:   05/05/15 1446  BP: 166/102  Pulse: 96  Height: 6' (1.829 m)  Weight: 223 lb (101.152 kg)  SpO2: 98%    PHYSICAL EXAM General: NAD HEENT: Poor dentition. Neck: No JVD, no thyromegaly. Lungs: Clear to auscultation bilaterally with normal respiratory effort. CV: Nondisplaced PMI.  Regular rate and rhythm, normal S1/S2, no S3/S4, no murmur. No pretibial or periankle edema.    Abdomen: Soft, no distention.  Neurologic: Alert and oriented.  Psych: Normal affect. Skin: Normal. Musculoskeletal: No gross deformities. Extremities: No clubbing or cyanosis.   ECG: Most recent ECG reviewed.      ASSESSMENT AND PLAN: 1. CAD with PCI: Symptomatically stable. Continue aspirin 81 mg. Will stop metoprolol and increase Coreg to 12.5 mg bid. Will start Lipitor 20 mg.  2. Ischemic cardiomyopathy/chronic  systolic heart failure: Appears compensated.  Stopped Lasix b/c it made him urinate. Will have him take Coreg. Stopped ACEI on his own and doubt further compliance.  3. Essential HTN: Elevated, but he reports checking it daily at home and it runs in 136/82 range. I am highly suspicious of his credibility. Will increase nifedipine to 60 mg daily and Coreg to 12.5 mg bid.  4. Hyperlipidemia: Will start Lipitor 20 mg.  5. Medication noncompliance: Importance of medication adherence emphasized.   Dispo: f/u 1 year  Prentice Docker, M.D., F.A.C.C.

## 2015-05-05 NOTE — Patient Instructions (Signed)
Your physician wants you to follow-up in: 1 year with K.lawrence NP You will receive a reminder letter in the mail two months in advance. If you don't receive a letter, please call our office to schedule the follow-up appointment.    STOP Plavix  STOP Metoprolol   INCREASE Coreg to 12.5 mg twice a day  INCREASE Nifedipine to 60 mg daily  Taken Lipitor 20 mg at dinner for cholesterol    If you need a refill on your cardiac medications before your next appointment, please call your pharmacy.       Thank you for choosing [facility] !

## 2015-06-20 ENCOUNTER — Other Ambulatory Visit: Payer: Self-pay | Admitting: Cardiovascular Disease

## 2015-07-12 ENCOUNTER — Ambulatory Visit: Payer: Self-pay | Admitting: Family Medicine

## 2015-07-13 ENCOUNTER — Ambulatory Visit (INDEPENDENT_AMBULATORY_CARE_PROVIDER_SITE_OTHER): Payer: Medicaid Other | Admitting: Family Medicine

## 2015-07-13 ENCOUNTER — Encounter: Payer: Self-pay | Admitting: Family Medicine

## 2015-07-13 VITALS — BP 147/95 | HR 90 | Temp 98.5°F | Ht 72.0 in | Wt 230.0 lb

## 2015-07-13 DIAGNOSIS — E785 Hyperlipidemia, unspecified: Secondary | ICD-10-CM

## 2015-07-13 DIAGNOSIS — E1165 Type 2 diabetes mellitus with hyperglycemia: Secondary | ICD-10-CM | POA: Insufficient documentation

## 2015-07-13 DIAGNOSIS — I10 Essential (primary) hypertension: Secondary | ICD-10-CM | POA: Diagnosis not present

## 2015-07-13 DIAGNOSIS — I5022 Chronic systolic (congestive) heart failure: Secondary | ICD-10-CM

## 2015-07-13 DIAGNOSIS — I6330 Cerebral infarction due to thrombosis of unspecified cerebral artery: Secondary | ICD-10-CM

## 2015-07-13 DIAGNOSIS — E119 Type 2 diabetes mellitus without complications: Secondary | ICD-10-CM | POA: Diagnosis not present

## 2015-07-13 DIAGNOSIS — I639 Cerebral infarction, unspecified: Secondary | ICD-10-CM | POA: Insufficient documentation

## 2015-07-13 DIAGNOSIS — Z794 Long term (current) use of insulin: Secondary | ICD-10-CM

## 2015-07-13 LAB — POCT GLYCOSYLATED HEMOGLOBIN (HGB A1C): Hemoglobin A1C: 9.8

## 2015-07-13 MED ORDER — ASPIRIN EC 81 MG PO TBEC: 81.0000 mg | DELAYED_RELEASE_TABLET | Freq: Every day | ORAL | Status: DC

## 2015-07-13 MED ORDER — ATORVASTATIN CALCIUM 20 MG PO TABS: 20.0000 mg | ORAL_TABLET | Freq: Every day | ORAL | Status: DC

## 2015-07-13 MED ORDER — LISINOPRIL 40 MG PO TABS: 40.0000 mg | ORAL_TABLET | Freq: Every day | ORAL | Status: DC

## 2015-07-13 MED ORDER — INSULIN GLARGINE 100 UNIT/ML SOLOSTAR PEN: 50.0000 [IU] | PEN_INJECTOR | Freq: Every day | SUBCUTANEOUS | Status: DC

## 2015-07-13 MED ORDER — GABAPENTIN 400 MG PO CAPS: 400.0000 mg | ORAL_CAPSULE | Freq: Two times a day (BID) | ORAL | Status: DC

## 2015-07-13 MED ORDER — NIFEDIPINE ER 60 MG PO TB24: 60.0000 mg | ORAL_TABLET | Freq: Every day | ORAL | Status: DC

## 2015-07-13 NOTE — Progress Notes (Signed)
BP 147/95 mmHg  Pulse 90  Temp(Src) 98.5 F (36.9 C) (Oral)  Ht 6' (1.829 m)  Wt 230 lb (104.327 kg)  BMI 31.19 kg/m2   Subjective:    Patient ID: Edwin White, male    DOB: 28-Jan-1966, 50 y.o.   MRN: [id number]  HPI: Edwin White is a 50 y.o. male presenting on 07/13/2015 for Establish Care   HPI Type 2 diabetes Patient has known type 2 diabetes with neuropathy. Patient also has a history of strokes may be 2 or 3 and myocardial infarction and heart failure because of the infarction which are all complications of his diabetes. He also has been given the diagnosis previously of gastroparesis but that is currently under control. Currently for the diabetes the patient is on Lantus 50 units at bedtime but is currently not on any oral medications to support it. Patient is coming from a physician elsewhere in [place] him [place]. Patient is also on lisinopril 40 mg for renal protection and on Neurontin 400 mg twice daily. Per patient his arteries seen on ophthalmologist within the past 6 months. He has known decreased sensation in his feet laterally and a burning and tingling sensation because of it. We did labs today will come back but he had a hemoglobin A1c today which came back as 9.8.  Hypertension Patient has known hypertension and his blood pressure is 147/95 today. He has been out of his medication for about 2 days. He normally takes lisinopril 40 mg and nifedipine 60 mg for his blood pressure. Patient denies headaches, blurred vision, chest pains, shortness of breath. Denies any side effects from medication and is content with current medication.   Stroke Patient has had previous strokes possibly 2 or 3. This has left him with residual weakness on the right side of his body in both his arm and his leg. He has some ability to move that arm and leg but very significantly weak. He is currently on Lipitor 20 mg and aspirin as stroke prevention.  Chronic systolic heart failure Patient  has known chronic systolic heart failure and sees a cardiologist for this and per him he is not having any shortness of breath or significant swelling from this currently and is under control with his current medications.  Hyperlipidemia Patient has known hyperlipidemia with complications of both strokes and myocardial infarctions. He is currently on Lipitor 20 which may need to go up after labs are checked.  Relevant past medical, surgical, family and social history reviewed and updated as indicated. Interim medical history since our last visit reviewed. Allergies and medications reviewed and updated.  Review of Systems  Constitutional: Negative for fever and chills.  HENT: Negative for congestion, ear discharge and ear pain.   Eyes: Negative for discharge and visual disturbance.  Respiratory: Negative for chest tightness, shortness of breath and wheezing.   Cardiovascular: Negative for chest pain, palpitations and leg swelling.  Gastrointestinal: Negative for abdominal pain, diarrhea and constipation.  Genitourinary: Negative for difficulty urinating.  Musculoskeletal: Negative for back pain and gait problem.  Skin: Negative for color change and rash.  Neurological: Positive for weakness and numbness. Negative for dizziness, seizures, syncope, speech difficulty, light-headedness and headaches.  All other systems reviewed and are negative.   Per HPI unless specifically indicated above  Social History   Social History  . Marital Status: Legally Separated    Spouse Name: N/A  . Number of Children: N/A  . Years of Education: N/A   Occupational  History  . Not on file.   Social History Main Topics  . Smoking status: Current Every Day Smoker -- 1.00 packs/day for 36 years    Types: Cigarettes    Start date: 05/05/1979  . Smokeless tobacco: Never Used  . Alcohol Use: No     Comment: "quit drinking alcohol in 2006"  . Drug Use: No  . Sexual Activity: Not Currently   Other Topics  Concern  . Not on file   Social History Narrative   Disabled.     Past Surgical History  Procedure Laterality Date  . Coronary artery bypass graft  2007    "CABG X5"  . Coronary angioplasty with stent placement  2006  . Left heart catheterization with coronary/graft angiogram N/A 01/04/2014    Procedure: LEFT HEART CATHETERIZATION WITH Beatrix Fetters;  Surgeon: Burnell Blanks, MD;  Location: [facility] CATH LAB;  Service: Cardiovascular;  Laterality: N/A;  . Percutaneous coronary stent intervention (pci-s)  01/04/2014    Procedure: PERCUTANEOUS CORONARY STENT INTERVENTION (PCI-S);  Surgeon: Burnell Blanks, MD;  Location: [facility] CATH LAB;  Service: Cardiovascular;;    Family History  Problem Relation Age of Onset  . Coronary artery disease Father       Medication List       This list is accurate as of: 07/13/15 10:47 AM.  Always use your most recent med list.               aspirin EC 81 MG tablet  Take 1 tablet (81 mg total) by mouth daily.     atorvastatin 20 MG tablet  Commonly known as:  LIPITOR  Take 1 tablet (20 mg total) by mouth daily.     gabapentin 400 MG capsule  Commonly known as:  NEURONTIN  Take 1 capsule (400 mg total) by mouth 2 (two) times daily.     Insulin Glargine 100 UNIT/ML Solostar Pen  Commonly known as:  LANTUS SOLOSTAR  Inject 50 Units into the skin daily at 10 pm.     lisinopril 40 MG tablet  Commonly known as:  PRINIVIL,ZESTRIL  Take 1 tablet (40 mg total) by mouth daily.     NIFEdipine 60 MG 24 hr tablet  Commonly known as:  PROCARDIA-XL/ADALAT CC  Take 1 tablet (60 mg total) by mouth daily.     nitroGLYCERIN 0.4 MG SL tablet  Commonly known as:  NITROSTAT  Place 1 tablet (0.4 mg total) under the tongue every 5 (five) minutes x 3 doses as needed for chest pain.           Objective:    BP 147/95 mmHg  Pulse 90  Temp(Src) 98.5 F (36.9 C) (Oral)  Ht 6' (1.829 m)  Wt 230 lb (104.327 kg)  BMI 31.19 kg/m2  Wt  Readings from Last 3 Encounters:  07/13/15 230 lb (104.327 kg)  05/05/15 223 lb (101.152 kg)  11/21/14 245 lb (111.131 kg)    Physical Exam  Constitutional: He is oriented to person, place, and time. He appears well-developed and well-nourished. No distress.  Eyes: Conjunctivae and EOM are normal. Pupils are equal, round, and reactive to light. Right eye exhibits no discharge. No scleral icterus.  Neck: Neck supple. No thyromegaly present.  Cardiovascular: Normal rate, regular rhythm, normal heart sounds and intact distal pulses.   No murmur heard. Pulmonary/Chest: Effort normal and breath sounds normal. No respiratory distress. He has no wheezes. He has no rales.  Musculoskeletal: Normal range of motion. He exhibits no edema  or tenderness.  Lymphadenopathy:    He has no cervical adenopathy.  Neurological: He is alert and oriented to person, place, and time. He has normal reflexes. He displays atrophy. A sensory deficit (bilateral decreased sensation below ankles on the) is present. No cranial nerve deficit. He exhibits abnormal muscle tone (Right arm and right leg 2 out of 5 strength). Coordination normal.  Skin: Skin is warm and dry. No rash noted. He is not diaphoretic.  Psychiatric: He has a normal mood and affect. His behavior is normal.  Nursing note and vitals reviewed.  Diabetic Foot Exam - Simple   Simple Foot Form  Diabetic Foot exam was performed with the following findings:  Yes 07/13/2015  4:00 PM  Visual Inspection  No deformities, no ulcerations, no other skin breakdown bilaterally:  Yes  Sensation Testing  Pulse Check  Posterior Tibialis and Dorsalis pulse intact bilaterally:  Yes  Comments  Decreased sensation below ankles and both feet. No fine touch sensation at all in those areas.      Results for orders placed or performed during the hospital encounter of 11/21/14  CBC with Differential  Result Value Ref Range   WBC 7.8 4.0 - 10.5 K/uL   RBC 4.73 4.22 - 5.81  MIL/uL   Hemoglobin 14.5 13.0 - 17.0 g/dL   HCT 41.8 39.0 - 52.0 %   MCV 88.4 78.0 - 100.0 fL   MCH 30.7 26.0 - 34.0 pg   MCHC 34.7 30.0 - 36.0 g/dL   RDW 13.5 11.5 - 15.5 %   Platelets 172 150 - 400 K/uL   Neutrophils Relative % 74 43 - 77 %   Neutro Abs 5.7 1.7 - 7.7 K/uL   Lymphocytes Relative 21 12 - 46 %   Lymphs Abs 1.7 0.7 - 4.0 K/uL   Monocytes Relative 5 3 - 12 %   Monocytes Absolute 0.4 0.1 - 1.0 K/uL   Eosinophils Relative 0 0 - 5 %   Eosinophils Absolute 0.0 0.0 - 0.7 K/uL   Basophils Relative 0 0 - 1 %   Basophils Absolute 0.0 0.0 - 0.1 K/uL  Basic metabolic panel  Result Value Ref Range   Sodium 136 135 - 145 mmol/L   Potassium 3.7 3.5 - 5.1 mmol/L   Chloride 101 101 - 111 mmol/L   CO2 25 22 - 32 mmol/L   Glucose, Bld 228 (H) 65 - 99 mg/dL   BUN 10 6 - 20 mg/dL   Creatinine, Ser 1.14 0.61 - 1.24 mg/dL   Calcium 9.4 8.9 - 10.3 mg/dL   GFR calc non Af Amer >60 >60 mL/min   GFR calc Af Amer >60 >60 mL/min   Anion gap 10 5 - 15  Troponin I  Result Value Ref Range   Troponin I 0.03 <0.031 ng/mL  Brain natriuretic peptide  Result Value Ref Range   B Natriuretic Peptide 194.0 (H) 0.0 - 100.0 pg/mL      Assessment & Plan:   Problem List Items Addressed This Visit      Cardiovascular and Mediastinum   CHRONIC SYSTOLIC HEART FAILURE   Relevant Medications   aspirin EC 81 MG tablet   atorvastatin (LIPITOR) 20 MG tablet   lisinopril (PRINIVIL,ZESTRIL) 40 MG tablet   NIFEdipine (PROCARDIA-XL/ADALAT CC) 60 MG 24 hr tablet   Other Relevant Orders   CMP14+EGFR (Completed)   Hypertension   Relevant Medications   aspirin EC 81 MG tablet   atorvastatin (LIPITOR) 20 MG tablet   lisinopril (  PRINIVIL,ZESTRIL) 40 MG tablet   NIFEdipine (PROCARDIA-XL/ADALAT CC) 60 MG 24 hr tablet   Other Relevant Orders   CMP14+EGFR (Completed)   CVA (cerebral vascular accident) ([place])   Relevant Medications   aspirin EC 81 MG tablet   atorvastatin (LIPITOR) 20 MG tablet    lisinopril (PRINIVIL,ZESTRIL) 40 MG tablet   NIFEdipine (PROCARDIA-XL/ADALAT CC) 60 MG 24 hr tablet   Other Relevant Orders   Ambulatory referral to Neurology     Endocrine   Diabetes mellitus type 2, insulin dependent (HCC) - Primary   Relevant Medications   aspirin EC 81 MG tablet   atorvastatin (LIPITOR) 20 MG tablet   gabapentin (NEURONTIN) 400 MG capsule   lisinopril (PRINIVIL,ZESTRIL) 40 MG tablet   Insulin Glargine (LANTUS SOLOSTAR) 100 UNIT/ML Solostar Pen   Linagliptin-Metformin HCl ER 2.10-998 MG TB24   Other Relevant Orders   POCT glycosylated hemoglobin (Hb A1C) (Completed)   CMP14+EGFR (Completed)     Other   Hyperlipidemia LDL goal <70   Relevant Medications   aspirin EC 81 MG tablet   atorvastatin (LIPITOR) 20 MG tablet   lisinopril (PRINIVIL,ZESTRIL) 40 MG tablet   NIFEdipine (PROCARDIA-XL/ADALAT CC) 60 MG 24 hr tablet   Other Relevant Orders   Lipid panel (Completed)      Patient will likely need some medication adjustments but will leave as they are currently until we have some labs for kidneys and liver to see if we are able to make some adjustments.  Follow up plan: Return in about 2 weeks (around 07/27/2015), or if symptoms worsen or fail to improve, for HTN, DM, .  Caryl Pina, MD [facility] 07/13/2015, 10:47 AM

## 2015-07-14 ENCOUNTER — Telehealth: Payer: Self-pay

## 2015-07-14 LAB — LIPID PANEL
Chol/HDL Ratio: 4.1 ratio units (ref 0.0–5.0)
Cholesterol, Total: 169 mg/dL (ref 100–199)
HDL: 41 mg/dL (ref >39)
LDL CALC: 85 mg/dL (ref 0–99)
TRIGLYCERIDES: 213 mg/dL — AB (ref 0–149)
VLDL Cholesterol Cal: 43 mg/dL — ABNORMAL HIGH (ref 5–40)

## 2015-07-14 LAB — CMP14+EGFR
ALBUMIN: 4.3 g/dL (ref 3.5–5.5)
ALT: 24 IU/L (ref 0–44)
AST: 18 IU/L (ref 0–40)
Albumin/Globulin Ratio: 1.8 (ref 1.1–2.5)
Alkaline Phosphatase: 86 IU/L (ref 39–117)
BUN / CREAT RATIO: 11 (ref 9–20)
BUN: 11 mg/dL (ref 6–24)
Bilirubin Total: 0.4 mg/dL (ref 0.0–1.2)
CO2: 23 mmol/L (ref 18–29)
CREATININE: 0.99 mg/dL (ref 0.76–1.27)
Calcium: 9.8 mg/dL (ref 8.7–10.2)
Chloride: 96 mmol/L (ref 96–106)
GFR calc Af Amer: 102 mL/min/{1.73_m2} (ref >59)
GFR calc non Af Amer: 88 mL/min/{1.73_m2} (ref >59)
GLOBULIN, TOTAL: 2.4 g/dL (ref 1.5–4.5)
Glucose: 296 mg/dL — ABNORMAL HIGH (ref 65–99)
Potassium: 4.4 mmol/L (ref 3.5–5.2)
SODIUM: 138 mmol/L (ref 134–144)
TOTAL PROTEIN: 6.7 g/dL (ref 6.0–8.5)

## 2015-07-14 MED ORDER — LINAGLIPTIN-METFORMIN HCL ER 2.5-1000 MG PO TB24: 1.0000 | ORAL_TABLET | Freq: Two times a day (BID) | ORAL | Status: DC

## 2015-07-14 NOTE — Telephone Encounter (Signed)
Medicaid approved Jentadueto XR  [id number]

## 2015-07-27 ENCOUNTER — Ambulatory Visit (INDEPENDENT_AMBULATORY_CARE_PROVIDER_SITE_OTHER): Payer: Medicaid Other | Admitting: Family Medicine

## 2015-07-27 ENCOUNTER — Encounter: Payer: Self-pay | Admitting: Family Medicine

## 2015-07-27 VITALS — BP 138/89 | HR 85 | Temp 98.7°F | Ht 72.0 in | Wt 231.2 lb

## 2015-07-27 DIAGNOSIS — E119 Type 2 diabetes mellitus without complications: Secondary | ICD-10-CM

## 2015-07-27 DIAGNOSIS — I10 Essential (primary) hypertension: Secondary | ICD-10-CM

## 2015-07-27 DIAGNOSIS — Z794 Long term (current) use of insulin: Secondary | ICD-10-CM | POA: Diagnosis not present

## 2015-07-27 DIAGNOSIS — F17200 Nicotine dependence, unspecified, uncomplicated: Secondary | ICD-10-CM

## 2015-07-27 DIAGNOSIS — Z87891 Personal history of nicotine dependence: Secondary | ICD-10-CM

## 2015-07-27 NOTE — Assessment & Plan Note (Addendum)
Patient cannot recall whether he picked up his linagliptin-metformin, we will call pharmacy and figure out Whether he actually got it or not. Call pharmacy and found that it wasn't a prior authorization, but that has been approved and he should get it now. He has not been checking his blood sugar so I don't have anything to a go off of at this point.

## 2015-07-27 NOTE — Assessment & Plan Note (Signed)
138/89 today, much more controlled, continue current medications for now.

## 2015-07-27 NOTE — Progress Notes (Signed)
BP 138/89 mmHg  Pulse 85  Temp(Src) 98.7 F (37.1 C) (Oral)  Ht 6' (1.829 m)  Wt 231 lb 3.2 oz (104.872 kg)  BMI 31.35 kg/m2   Subjective:    Patient ID: Edwin White, male    DOB: 10/02/1965, 50 y.o.   MRN: [id number]  HPI: Edwin White is a 50 y.o. male presenting on 07/27/2015 for Hypertension   HPI Diabetes recheck Patient is coming in today for his diabetes recheck. He was here a month ago and his A1c was above 9. He is taking Lantus 50 units in the morning. He does not know whether he has picked up his linagliptin-metformin. Patient denies headaches, blurred vision, chest pains, shortness of breath, or weakness. Denies any side effects from medication and is content with current medication. He has not been checking because he said his meter ran out of batteries so does not have any blood sugars to report to me today.  Hypertension recheck Patient is currently on lisinopril 40 mg daily and Procardia 60 mg daily and his blood pressure today is controlled at 138/89. He denies any issues with this medication.  Smoking cessation Discussed smoking cessation with patient and he is currently smoking 1 pack per day and he has no desires to quit at this point.  Relevant past medical, surgical, family and social history reviewed and updated as indicated. Interim medical history since our last visit reviewed. Allergies and medications reviewed and updated.  Review of Systems  Constitutional: Negative for fever and chills.  HENT: Negative for ear discharge and ear pain.   Eyes: Negative for discharge and visual disturbance.  Respiratory: Negative for shortness of breath and wheezing.   Cardiovascular: Negative for chest pain and leg swelling.  Gastrointestinal: Negative for abdominal pain, diarrhea and constipation.  Genitourinary: Negative for difficulty urinating.  Musculoskeletal: Negative for back pain and gait problem.  Skin: Negative for rash.  Neurological: Negative for  dizziness, syncope, light-headedness and headaches.  All other systems reviewed and are negative.   Per HPI unless specifically indicated above     Medication List       This list is accurate as of: 07/27/15 10:41 AM.  Always use your most recent med list.               aspirin EC 81 MG tablet  Take 1 tablet (81 mg total) by mouth daily.     atorvastatin 20 MG tablet  Commonly known as:  LIPITOR  Take 1 tablet (20 mg total) by mouth daily.     gabapentin 400 MG capsule  Commonly known as:  NEURONTIN  Take 1 capsule (400 mg total) by mouth 2 (two) times daily.     Insulin Glargine 100 UNIT/ML Solostar Pen  Commonly known as:  LANTUS SOLOSTAR  Inject 50 Units into the skin daily at 10 pm.     Linagliptin-Metformin HCl ER 2.10-998 MG Tb24  Take 1 tablet by mouth 2 (two) times daily.     lisinopril 40 MG tablet  Commonly known as:  PRINIVIL,ZESTRIL  Take 1 tablet (40 mg total) by mouth daily.     NIFEdipine 60 MG 24 hr tablet  Commonly known as:  PROCARDIA-XL/ADALAT CC  Take 1 tablet (60 mg total) by mouth daily.     nitroGLYCERIN 0.4 MG SL tablet  Commonly known as:  NITROSTAT  Place 1 tablet (0.4 mg total) under the tongue every 5 (five) minutes x 3 doses as needed for chest pain.  Objective:    BP 138/89 mmHg  Pulse 85  Temp(Src) 98.7 F (37.1 C) (Oral)  Ht 6' (1.829 m)  Wt 231 lb 3.2 oz (104.872 kg)  BMI 31.35 kg/m2  Wt Readings from Last 3 Encounters:  07/27/15 231 lb 3.2 oz (104.872 kg)  07/13/15 230 lb (104.327 kg)  05/05/15 223 lb (101.152 kg)    Physical Exam  Constitutional: He is oriented to person, place, and time. He appears well-developed and well-nourished. No distress.  Eyes: Conjunctivae and EOM are normal. Pupils are equal, round, and reactive to light. Right eye exhibits no discharge. No scleral icterus.  Neck: Neck supple. No thyromegaly present.  Cardiovascular: Normal rate, regular rhythm, normal heart sounds and intact  distal pulses.   No murmur heard. Pulmonary/Chest: Effort normal and breath sounds normal. No respiratory distress. He has no wheezes.  Musculoskeletal: Normal range of motion. He exhibits no edema.  Lymphadenopathy:    He has no cervical adenopathy.  Neurological: He is alert and oriented to person, place, and time. Coordination normal.  Skin: Skin is warm and dry. No rash noted. He is not diaphoretic.  Psychiatric: He has a normal mood and affect. His behavior is normal.  Nursing note and vitals reviewed.   Results for orders placed or performed in visit on 07/13/15  CMP14+EGFR  Result Value Ref Range   Glucose 296 (H) 65 - 99 mg/dL   BUN 11 6 - 24 mg/dL   Creatinine, Ser 0.99 0.76 - 1.27 mg/dL   GFR calc non Af Amer 88 >59 mL/min/1.73   GFR calc Af Amer 102 >59 mL/min/1.73   BUN/Creatinine Ratio 11 9 - 20   Sodium 138 134 - 144 mmol/L   Potassium 4.4 3.5 - 5.2 mmol/L   Chloride 96 96 - 106 mmol/L   CO2 23 18 - 29 mmol/L   Calcium 9.8 8.7 - 10.2 mg/dL   Total Protein 6.7 6.0 - 8.5 g/dL   Albumin 4.3 3.5 - 5.5 g/dL   Globulin, Total 2.4 1.5 - 4.5 g/dL   Albumin/Globulin Ratio 1.8 1.1 - 2.5   Bilirubin Total 0.4 0.0 - 1.2 mg/dL   Alkaline Phosphatase 86 39 - 117 IU/L   AST 18 0 - 40 IU/L   ALT 24 0 - 44 IU/L  Lipid panel  Result Value Ref Range   Cholesterol, Total 169 100 - 199 mg/dL   Triglycerides 213 (H) 0 - 149 mg/dL   HDL 41 >39 mg/dL   VLDL Cholesterol Cal 43 (H) 5 - 40 mg/dL   LDL Calculated 85 0 - 99 mg/dL   Chol/HDL Ratio 4.1 0.0 - 5.0 ratio units  POCT glycosylated hemoglobin (Hb A1C)  Result Value Ref Range   Hemoglobin A1C 9.8       Assessment & Plan:   Problem List Items Addressed This Visit      Cardiovascular and Mediastinum   Hypertension    138/89 today, much more controlled, continue current medications for now.        Endocrine   Diabetes mellitus type 2, insulin dependent ([place]) - Primary    Patient cannot recall whether he picked up his  linagliptin-metformin, we will call pharmacy and figure out Whether he actually got it or not. Call pharmacy and found that it wasn't a prior authorization, but that has been approved and he should get it now. He has not been checking his blood sugar so I don't have anything to a go off of at this point.  Other Visit Diagnoses    Refused smoking cessation educational materials        Patient not ready to quit        Follow up plan: Return in about 4 weeks (around 08/24/2015), or if symptoms worsen or fail to improve, for Recheck diabetes and hypertension.  Counseling provided for all of the vaccine components No orders of the defined types were placed in this encounter.    Caryl Pina, MD [facility] 07/27/2015, 10:41 AM

## 2015-08-17 ENCOUNTER — Ambulatory Visit: Payer: Medicaid Other | Admitting: Neurology

## 2015-08-17 ENCOUNTER — Telehealth: Payer: Self-pay

## 2015-08-17 NOTE — Telephone Encounter (Signed)
Patient did not come to a new patient appointment today. 

## 2015-08-19 ENCOUNTER — Encounter: Payer: Self-pay | Admitting: Neurology

## 2015-09-02 ENCOUNTER — Ambulatory Visit (INDEPENDENT_AMBULATORY_CARE_PROVIDER_SITE_OTHER): Payer: Medicaid Other | Admitting: Family Medicine

## 2015-09-02 ENCOUNTER — Other Ambulatory Visit: Payer: Self-pay | Admitting: Cardiovascular Disease

## 2015-09-02 ENCOUNTER — Encounter: Payer: Self-pay | Admitting: Family Medicine

## 2015-09-02 VITALS — BP 132/83 | HR 95 | Temp 98.4°F | Ht 72.0 in | Wt 226.8 lb

## 2015-09-02 DIAGNOSIS — I10 Essential (primary) hypertension: Secondary | ICD-10-CM

## 2015-09-02 DIAGNOSIS — Z794 Long term (current) use of insulin: Secondary | ICD-10-CM | POA: Diagnosis not present

## 2015-09-02 DIAGNOSIS — E119 Type 2 diabetes mellitus without complications: Secondary | ICD-10-CM

## 2015-09-02 NOTE — Assessment & Plan Note (Signed)
Controlled currently continue current medications

## 2015-09-02 NOTE — Progress Notes (Signed)
BP 132/83 mmHg  Pulse 95  Temp(Src) 98.4 F (36.9 C) (Oral)  Ht 6' (1.829 m)  Wt 226 lb 12.8 oz (102.876 kg)  BMI 30.75 kg/m2   Subjective:    Patient ID: Edwin White, male    DOB: 03-Aug-1965, 50 y.o.   MRN: [id number]  HPI: Edwin White is a 50 y.o. male presenting on 09/02/2015 for Hypertension and Diabetes   HPI Hypertension recheck Patient comes in today for hypertension recheck. He is currently taking lisinopril and Procardia. Patient's blood pressure today is 132/83. Patient denies headaches, blurred vision, chest pains, shortness of breath, or weakness. Denies any side effects from medication and is content with current medication.   Diabetes recheck Patient is coming in today for a diabetes recheck. He is currently on Lantus 50 units at bedtime. He was also supposed to have been on Linagliptin-metformin. He tried it for 3 days and got diarrhea once or twice a day and stopped it. Discussed that this is a common side effect and then if he continues to take it that will go away and he is willing to retry again. Blood sugar 200-300 in the early morning.   Relevant past medical, surgical, family and social history reviewed and updated as indicated. Interim medical history since our last visit reviewed. Allergies and medications reviewed and updated.  Review of Systems  Constitutional: Negative for fever and chills.  HENT: Negative for ear discharge and ear pain.   Eyes: Negative for discharge and visual disturbance.  Respiratory: Negative for shortness of breath and wheezing.   Cardiovascular: Negative for chest pain and leg swelling.  Gastrointestinal: Negative for abdominal pain, diarrhea and constipation.  Genitourinary: Negative for difficulty urinating.  Musculoskeletal: Negative for back pain and gait problem.  Skin: Negative for rash.  Neurological: Negative for dizziness, syncope, light-headedness and headaches.  All other systems reviewed and are  negative.   Per HPI unless specifically indicated above     Medication List       This list is accurate as of: 09/02/15 10:27 AM.  Always use your most recent med list.               aspirin EC 81 MG tablet  Take 1 tablet (81 mg total) by mouth daily.     atorvastatin 20 MG tablet  Commonly known as:  LIPITOR  Take 1 tablet (20 mg total) by mouth daily.     gabapentin 400 MG capsule  Commonly known as:  NEURONTIN  Take 1 capsule (400 mg total) by mouth 2 (two) times daily.     Insulin Glargine 100 UNIT/ML Solostar Pen  Commonly known as:  LANTUS SOLOSTAR  Inject 50 Units into the skin daily at 10 pm.     Linagliptin-Metformin HCl ER 2.10-998 MG Tb24  Take 1 tablet by mouth 2 (two) times daily.     lisinopril 40 MG tablet  Commonly known as:  PRINIVIL,ZESTRIL  Take 1 tablet (40 mg total) by mouth daily.     NIFEdipine 60 MG 24 hr tablet  Commonly known as:  PROCARDIA-XL/ADALAT CC  Take 1 tablet (60 mg total) by mouth daily.     nitroGLYCERIN 0.4 MG SL tablet  Commonly known as:  NITROSTAT  Place 1 tablet (0.4 mg total) under the tongue every 5 (five) minutes x 3 doses as needed for chest pain.           Objective:    BP 132/83 mmHg  Pulse 95  Temp(Src) 98.4 F (36.9 C) (Oral)  Ht 6' (1.829 m)  Wt 226 lb 12.8 oz (102.876 kg)  BMI 30.75 kg/m2  Wt Readings from Last 3 Encounters:  09/02/15 226 lb 12.8 oz (102.876 kg)  07/27/15 231 lb 3.2 oz (104.872 kg)  07/13/15 230 lb (104.327 kg)    Physical Exam  Constitutional: He is oriented to person, place, and time. He appears well-developed and well-nourished. No distress.  Eyes: Conjunctivae and EOM are normal. Pupils are equal, round, and reactive to light. Right eye exhibits no discharge. No scleral icterus.  Neck: Neck supple. No thyromegaly present.  Cardiovascular: Normal rate, regular rhythm, normal heart sounds and intact distal pulses.   No murmur heard. Pulmonary/Chest: Effort normal and breath  sounds normal. No respiratory distress. He has no wheezes.  Musculoskeletal: Normal range of motion. He exhibits no edema.  Lymphadenopathy:    He has no cervical adenopathy.  Neurological: He is alert and oriented to person, place, and time. Coordination normal.  Skin: Skin is warm and dry. No rash noted. He is not diaphoretic.  Psychiatric: He has a normal mood and affect. His behavior is normal.  Vitals reviewed.   Results for orders placed or performed in visit on 07/13/15  CMP14+EGFR  Result Value Ref Range   Glucose 296 (H) 65 - 99 mg/dL   BUN 11 6 - 24 mg/dL   Creatinine, Ser 0.99 0.76 - 1.27 mg/dL   GFR calc non Af Amer 88 >59 mL/min/1.73   GFR calc Af Amer 102 >59 mL/min/1.73   BUN/Creatinine Ratio 11 9 - 20   Sodium 138 134 - 144 mmol/L   Potassium 4.4 3.5 - 5.2 mmol/L   Chloride 96 96 - 106 mmol/L   CO2 23 18 - 29 mmol/L   Calcium 9.8 8.7 - 10.2 mg/dL   Total Protein 6.7 6.0 - 8.5 g/dL   Albumin 4.3 3.5 - 5.5 g/dL   Globulin, Total 2.4 1.5 - 4.5 g/dL   Albumin/Globulin Ratio 1.8 1.1 - 2.5   Bilirubin Total 0.4 0.0 - 1.2 mg/dL   Alkaline Phosphatase 86 39 - 117 IU/L   AST 18 0 - 40 IU/L   ALT 24 0 - 44 IU/L  Lipid panel  Result Value Ref Range   Cholesterol, Total 169 100 - 199 mg/dL   Triglycerides 213 (H) 0 - 149 mg/dL   HDL 41 >39 mg/dL   VLDL Cholesterol Cal 43 (H) 5 - 40 mg/dL   LDL Calculated 85 0 - 99 mg/dL   Chol/HDL Ratio 4.1 0.0 - 5.0 ratio units  POCT glycosylated hemoglobin (Hb A1C)  Result Value Ref Range   Hemoglobin A1C 9.8       Assessment & Plan:   Problem List Items Addressed This Visit      Cardiovascular and Mediastinum   Hypertension - Primary    Controlled currently continue current medications        Endocrine   Diabetes mellitus type 2, insulin dependent (HCC)    Blood sugar still running high says he did not tolerate the leg lift and metformin combo initially, we will try again instructed that the diarrhea will resolve after  the first couple weeks. Will call me if he still does not tolerate. See back in one month          Follow up plan: Return in about 4 weeks (around 09/30/2015), or if symptoms worsen or fail to improve, for Diabetes and hypertension recheck.  Counseling provided for  all of the vaccine components No orders of the defined types were placed in this encounter.    Caryl Pina, MD [facility] 09/02/2015, 10:27 AM

## 2015-09-02 NOTE — Assessment & Plan Note (Signed)
Blood sugar still running high says he did not tolerate the leg lift and metformin combo initially, we will try again instructed that the diarrhea will resolve after the first couple weeks. Will call me if he still does not tolerate. See back in one month

## 2015-09-07 ENCOUNTER — Ambulatory Visit (INDEPENDENT_AMBULATORY_CARE_PROVIDER_SITE_OTHER): Payer: Medicaid Other | Admitting: Neurology

## 2015-09-07 ENCOUNTER — Encounter: Payer: Self-pay | Admitting: Neurology

## 2015-09-07 VITALS — BP 122/84 | HR 88 | Resp 20 | Ht 72.0 in | Wt 227.0 lb

## 2015-09-07 DIAGNOSIS — I6330 Cerebral infarction due to thrombosis of unspecified cerebral artery: Secondary | ICD-10-CM

## 2015-09-07 DIAGNOSIS — I69328 Other speech and language deficits following cerebral infarction: Secondary | ICD-10-CM | POA: Diagnosis not present

## 2015-09-07 DIAGNOSIS — I69359 Hemiplegia and hemiparesis following cerebral infarction affecting unspecified side: Secondary | ICD-10-CM

## 2015-09-07 NOTE — Progress Notes (Signed)
Reason for visit: Stroke  Referring physician: Dr. Marlowe Kaysettinger  Edwin White is a 50 y.o. male  History of present illness:  Edwin White is a 50 year old right-handed white male with a history of diabetes and tobacco abuse who has a known history of cerebrovascular disease. The patient was seen in 2007 with a documented right middle cerebral artery occlusion at that time. The patient has been doing relatively well, but in August 2016, he indicates that he awakened with a right-sided weakness and slurred speech. The patient was told that he had a stroke event, so he decided that there was no reason to go to the doctor. The patient has had a persistent right-sided weakness issue involving the arm greater than leg. He denies any numbness, he denies any visual disturbance. He continues to operate a motor vehicle. He continues to take low-dose aspirin, the stroke occurred while he was on aspirin. The patient denies any falls. He does not have to use a cane or a walker to ambulate. He denies any palpitations of the heart or chest pain. He has not had any blackout events. He denies issues controlling the bowels or the bladder. He was recent seen by his primary physician, and he is sent to this office for an evaluation. His last hemoglobin A1c was 9.8, and he continues to smoke a pack of cigarettes daily.   Past Medical History  Diagnosis Date  . CHF (congestive heart failure) (HCC)     No recurrent admissions for heart failure.  . Ischemic cardiomyopathy     Ejection fraction improved from 15%-20% to 40%-45% in 2012 by echocardiogram  . Coronary artery disease     Status post coronary bypass grafting in 2007  . History of medication noncompliance   . Moderate mitral regurgitation by prior echocardiogram   . Post-infarction apical thrombus (HCC)     Anticoagulation discontinued  . Crescendo transient ischemic attacks   . Hyperlipidemia   . Gastroparesis     Gastric emptying study scheduled  .  Functional bowel disorder     Bloating and abdominal cramping as well as constipation requiring lubiprostone  . Hypertension     Poorly controlled  . Heart attack [facility](HCC) September 13, 2004    Followed by bypass surgery  . NSTEMI (non-ST elevated myocardial infarction) (HCC) 12/31/2013  . Type 2 diabetes mellitus (HCC)   . GERD (gastroesophageal reflux disease)   . Stroke [facility](HCC) 04/2012    denies residual on 12/31/2013    Past Surgical History  Procedure Laterality Date  . Coronary artery bypass graft  2007    "CABG X5"  . Coronary angioplasty with stent placement  2006  . Left heart catheterization with coronary/graft angiogram N/A 01/04/2014    Procedure: LEFT HEART CATHETERIZATION WITH Isabel CapriceORONARY/GRAFT ANGIOGRAM;  Surgeon: Kathleene Hazelhristopher D McAlhany, MD;  [facility];  Service: Cardiovascular;  Laterality: N/A;  . Percutaneous coronary stent intervention (pci-s)  01/04/2014    Procedure: PERCUTANEOUS CORONARY STENT INTERVENTION (PCI-S);  Surgeon: Kathleene Hazelhristopher D McAlhany, MD;  [facility];  Service: Cardiovascular;;    Family History  Problem Relation Age of Onset  . Adopted: Yes  . Coronary artery disease Father   . Cancer Mother     Social history:  reports that he has been smoking Cigarettes.  He started smoking about 36 years ago. He has a 36 pack-year smoking history. He has never used smokeless tobacco. He reports that he does not drink alcohol or use illicit drugs.  Medications:  Prior to Admission medications   Medication Sig Start Date End Date Taking? Authorizing Provider  aspirin EC 81 MG tablet Take 1 tablet (81 mg total) by mouth daily. 07/13/15  Yes Elige Radon Dettinger, MD  atorvastatin (LIPITOR) 20 MG tablet Take 1 tablet (20 mg total) by mouth daily. 07/13/15  Yes Elige Radon Dettinger, MD  furosemide (LASIX) 80 MG tablet TAKE 1 TABLET BY MOUTH DAILY 09/02/15  Yes Laqueta Linden, MD  gabapentin (NEURONTIN) 400 MG capsule Take 1 capsule (400 mg total) by mouth 2  (two) times daily. 07/13/15  Yes Elige Radon Dettinger, MD  Insulin Glargine (LANTUS SOLOSTAR) 100 UNIT/ML Solostar Pen Inject 50 Units into the skin daily at 10 pm. 07/13/15  Yes Elige Radon Dettinger, MD  Linagliptin-Metformin HCl ER 2.10-998 MG TB24 Take 1 tablet by mouth 2 (two) times daily. 07/14/15  Yes Elige Radon Dettinger, MD  lisinopril (PRINIVIL,ZESTRIL) 40 MG tablet Take 1 tablet (40 mg total) by mouth daily. 07/13/15  Yes Elige Radon Dettinger, MD  NIFEdipine (PROCARDIA-XL/ADALAT CC) 60 MG 24 hr tablet Take 1 tablet (60 mg total) by mouth daily. 07/13/15  Yes Elige Radon Dettinger, MD  nitroGLYCERIN (NITROSTAT) 0.4 MG SL tablet Place 1 tablet (0.4 mg total) under the tongue every 5 (five) minutes x 3 doses as needed for chest pain. 01/05/14  Yes Brittainy Sherlynn Carbon, PA-C  potassium chloride SA (K-DUR,KLOR-CON) 20 MEQ tablet TAKE 1 TABLET BY MOUTH DAILY 09/02/15  Yes Laqueta Linden, MD      Allergies  Allergen Reactions  . Pravastatin Other (See Comments)    PT CANNOT NOT WALK OR MOVE.    ROS:  Out of a complete 14 system review of symptoms, the patient complains only of the following symptoms, and all other reviewed systems are negative.  Weakness Slurred speech  Blood pressure 122/84, pulse 88, resp. rate 20, height 6' (1.829 m), weight 227 lb (102.967 kg).  Physical Exam  General: The patient is alert and cooperative at the time of the examination.  Eyes: Pupils are equal, round, and reactive to light. Discs are flat bilaterally.  Neck: The neck is supple, no carotid bruits are noted.  Respiratory: The respiratory examination is clear.  Cardiovascular: The cardiovascular examination reveals a regular rate and rhythm, no obvious murmurs or rubs are noted.  Skin: Extremities are without significant edema.  Neurologic Exam  Mental status: The patient is alert and oriented x 3 at the time of the examination. The patient has apparent normal recent and remote memory, with an apparently  normal attention span and concentration ability.  Cranial nerves: Facial symmetry is present. There is good sensation of the face to pinprick and soft touch bilaterally. The strength of the facial muscles and the muscles to head turning and shoulder shrug are normal bilaterally. Speech is dysarthric, not aphasic. Extraocular movements are full. Visual fields are full. The tongue is midline, and the patient has symmetric elevation of the soft palate. No obvious hearing deficits are noted.  Motor: The motor testing reveals 5 over 5 strength of the left extremities. On the right, there is 4/5 strength with the intrinsic muscles of the hand, and the biceps and deltoid strength. There is 4/5 strength with hip flexion on the right leg. Otherwise, muscle groups are normal in strength. Good symmetric motor tone is slightly increased on the right arm and right leg.  Sensory: Sensory testing is intact to pinprick, soft touch, vibration sensation, and position sense on all  4 extremities, with exception that there is some decrease in position sense on the right arm and right leg. No evidence of extinction is noted.  Coordination: Cerebellar testing reveals good finger-nose-finger and heel-to-shin bilaterally, rapid alternating movements of the right arm are decreased.  Gait and station: Gait is associated with a slight circumduction gait with the right leg. Slight the wide-based stance is noted. The patient has unsteadiness with tandem gait. Romberg is negative.  Reflexes: Deep tendon reflexes are symmetric and normal bilaterally. Toes are downgoing bilaterally.   Assessment/Plan:  1. Left brain subcortical stroke  2. Diabetes  3. Tobacco abuse  4. Hypertension  5. Dyslipidemia  The patient has multiple risk factors for stroke. He continues on low-dose aspirin, but he is actively smoking. I indicated that he needs to stop smoking. We will pursue a bit of a workup for the stroke that occurred in August  2016. He will undergo MRI of the brain and MRA of the head. He will undergo carotid Doppler study he will be sent for occupational therapy evaluation to help with the right arm weakness and decreased mobility and pain in the right shoulder. He will follow-up in 4 months.  Marlan Palau MD 09/07/2015 8:13 PM  [facility] [address] [place], [place] [address]  Phone [phone] Fax [phone]

## 2015-09-07 NOTE — Patient Instructions (Signed)
Stroke Prevention Some medical conditions and behaviors are associated with an increased chance of having a stroke. You may prevent a stroke by making healthy choices and managing medical conditions. HOW CAN I REDUCE MY RISK OF HAVING A STROKE?   Stay physically active. Get at least 30 minutes of activity on most or all days.  Do not smoke. It may also be helpful to avoid exposure to secondhand smoke.  Limit alcohol use. Moderate alcohol use is considered to be:  No more than 2 drinks per day for men.  No more than 1 drink per day for nonpregnant women.  Eat healthy foods. This involves:  Eating 5 or more servings of fruits and vegetables a day.  Making dietary changes that address high blood pressure (hypertension), high cholesterol, diabetes, or obesity.  Manage your cholesterol levels.  Making food choices that are high in fiber and low in saturated fat, trans fat, and cholesterol may control cholesterol levels.  Take any prescribed medicines to control cholesterol as directed by your health care provider.  Manage your diabetes.  Controlling your carbohydrate and sugar intake is recommended to manage diabetes.  Take any prescribed medicines to control diabetes as directed by your health care provider.  Control your hypertension.  Making food choices that are low in salt (sodium), saturated fat, trans fat, and cholesterol is recommended to manage hypertension.  Ask your health care provider if you need treatment to lower your blood pressure. Take any prescribed medicines to control hypertension as directed by your health care provider.  If you are 18-39 years of age, have your blood pressure checked every 3-5 years. If you are 40 years of age or older, have your blood pressure checked every year.  Maintain a healthy weight.  Reducing calorie intake and making food choices that are low in sodium, saturated fat, trans fat, and cholesterol are recommended to manage  weight.  Stop drug abuse.  Avoid taking birth control pills.  Talk to your health care provider about the risks of taking birth control pills if you are over 35 years old, smoke, get migraines, or have ever had a blood clot.  Get evaluated for sleep disorders (sleep apnea).  Talk to your health care provider about getting a sleep evaluation if you snore a lot or have excessive sleepiness.  Take medicines only as directed by your health care provider.  For some people, aspirin or blood thinners (anticoagulants) are helpful in reducing the risk of forming abnormal blood clots that can lead to stroke. If you have the irregular heart rhythm of atrial fibrillation, you should be on a blood thinner unless there is a good reason you cannot take them.  Understand all your medicine instructions.  Make sure that other conditions (such as anemia or atherosclerosis) are addressed. SEEK IMMEDIATE MEDICAL CARE IF:   You have sudden weakness or numbness of the face, arm, or leg, especially on one side of the body.  Your face or eyelid droops to one side.  You have sudden confusion.  You have trouble speaking (aphasia) or understanding.  You have sudden trouble seeing in one or both eyes.  You have sudden trouble walking.  You have dizziness.  You have a loss of balance or coordination.  You have a sudden, severe headache with no known cause.  You have new chest pain or an irregular heartbeat. Any of these symptoms may represent a serious problem that is an emergency. Do not wait to see if the symptoms will   go away. Get medical help at once. Call your local emergency services (911 in [place] Do not drive yourself to the hospital.   This information is not intended to replace advice given to you by your health care provider. Make sure you discuss any questions you have with your health care provider.   Document Released: 07/26/2004 Document Revised: 07/09/2014 Document Reviewed:  12/19/2012 Elsevier Interactive Patient Education 2016 [employer] Inc.  

## 2015-09-15 ENCOUNTER — Ambulatory Visit: Payer: Medicaid Other | Attending: Neurology | Admitting: Occupational Therapy

## 2015-09-15 ENCOUNTER — Other Ambulatory Visit: Payer: Medicaid Other

## 2015-09-18 ENCOUNTER — Inpatient Hospital Stay: Admission: RE | Admit: 2015-09-18 | Payer: Medicaid Other | Source: Ambulatory Visit

## 2015-09-18 ENCOUNTER — Other Ambulatory Visit: Payer: Medicaid Other

## 2015-09-24 ENCOUNTER — Other Ambulatory Visit: Payer: Medicaid Other

## 2015-10-01 ENCOUNTER — Ambulatory Visit
Admission: RE | Admit: 2015-10-01 | Discharge: 2015-10-01 | Disposition: A | Discharge status: Home / Self Care | Payer: Medicaid Other | Source: Ambulatory Visit | Attending: Neurology | Admitting: Neurology

## 2015-10-01 DIAGNOSIS — I6330 Cerebral infarction due to thrombosis of unspecified cerebral artery: Secondary | ICD-10-CM | POA: Diagnosis not present

## 2015-10-02 ENCOUNTER — Telehealth: Payer: Self-pay | Admitting: Neurology

## 2015-10-02 DIAGNOSIS — I69328 Other speech and language deficits following cerebral infarction: Secondary | ICD-10-CM

## 2015-10-02 DIAGNOSIS — I69359 Hemiplegia and hemiparesis following cerebral infarction affecting unspecified side: Principal | ICD-10-CM

## 2015-10-02 NOTE — Telephone Encounter (Signed)
I called the patient and I talked with the sister. The MRI shows chronic changes, IC atherosclerosis. He is to get a doppler done. He wants the OT to be in [place]. I will get this set up.   MRI brain 10/01/15:  IMPRESSION: This MRI of the brain without contrast shows the following: 1. Remote left temporoparietal infarction  2. Remote lacunar infarctions in the left greater than right basal ganglia and the left thalamus. Some of the basal ganglia infarctions show evidence of prior hemorrhage. 3. Maxillary chronic sinusitis. Fluid within the mastoid air cells consistent with eustachian tube dysfunction. 4. No flow void noted within the right middle cerebral artery.  5. When compared to the MRI of the brain dated 04/08/2015, there is no significant interval change.    MRA head 10/01/15:  IMPRESSION: This MR angiogram of the intracerebral arteries shows the following: 1. Occlusion of the right middle cerebral artery just beyond an anterior temporal branch. This is unchanged when compared to the 04/21/2012 MRA 2. Atherosclerotic irregularities within the anterior cerebral arteries and the right greater than left posterior cerebral arteries. Stenosis appears to be greater in the right posterior cerebral artery compared to the 04/21/2012 study.

## 2015-10-03 ENCOUNTER — Ambulatory Visit: Payer: Medicaid Other | Attending: Neurology | Admitting: Occupational Therapy

## 2015-10-03 ENCOUNTER — Encounter: Payer: Self-pay | Admitting: Occupational Therapy

## 2015-10-03 ENCOUNTER — Ambulatory Visit: Payer: Medicaid Other | Admitting: Family Medicine

## 2015-10-03 DIAGNOSIS — R278 Other lack of coordination: Secondary | ICD-10-CM | POA: Diagnosis not present

## 2015-10-03 DIAGNOSIS — M79621 Pain in right upper arm: Secondary | ICD-10-CM | POA: Diagnosis present

## 2015-10-03 DIAGNOSIS — I69351 Hemiplegia and hemiparesis following cerebral infarction affecting right dominant side: Secondary | ICD-10-CM | POA: Diagnosis present

## 2015-10-03 NOTE — Therapy (Signed)
[facility] [address] [place], [place], [address] Phone: [phone]   Fax:  [phone]  Occupational Therapy Evaluation  Patient Details  Name: Edwin White MRN: [id number] Date of Birth: [birth year] Referring Provider: Dr. Stephanie Acre  Encounter Date: 10/03/2015      OT End of Session - 10/03/15 1213    Visit Number 1   Number of Visits 9   Date for OT Re-Evaluation 12/02/15   Authorization Type MCD - awaiting authorization   OT Start Time 1105   OT Stop Time 1150   OT Time Calculation (min) 45 min   Activity Tolerance Patient tolerated treatment well      Past Medical History  Diagnosis Date  . CHF (congestive heart failure) (HCC)     No recurrent admissions for heart failure.  . Ischemic cardiomyopathy     Ejection fraction improved from 15%-20% to 40%-45% in 2012 by echocardiogram  . Coronary artery disease     Status post coronary bypass grafting in 2007  . History of medication noncompliance   . Moderate mitral regurgitation by prior echocardiogram   . Post-infarction apical thrombus (HCC)     Anticoagulation discontinued  . Crescendo transient ischemic attacks   . Hyperlipidemia   . Gastroparesis     Gastric emptying study scheduled  . Functional bowel disorder     Bloating and abdominal cramping as well as constipation requiring lubiprostone  . Hypertension     Poorly controlled  . Heart attack [facility]) September 13, 2004    Followed by bypass surgery  . NSTEMI (non-ST elevated myocardial infarction) (HCC) 12/31/2013  . Type 2 diabetes mellitus (HCC)   . GERD (gastroesophageal reflux disease)   . Stroke [facility]) 04/2012    denies residual on 12/31/2013    Past Surgical History  Procedure Laterality Date  . Coronary artery bypass graft  2007    "CABG X5"  . Coronary angioplasty with stent placement  2006  . Left heart catheterization with coronary/graft angiogram N/A 01/04/2014    Procedure: LEFT  HEART CATHETERIZATION WITH Isabel Caprice;  Surgeon: Kathleene Hazel, MD;  Location: [facility] CATH LAB;  Service: Cardiovascular;  Laterality: N/A;  . Percutaneous coronary stent intervention (pci-s)  01/04/2014    Procedure: PERCUTANEOUS CORONARY STENT INTERVENTION (PCI-S);  Surgeon: Kathleene Hazel, MD;  Location: [facility] CATH LAB;  Service: Cardiovascular;;    There were no vitals filed for this visit.  Visit Diagnosis:  Other lack of coordination - Plan: Ot plan of care cert/re-cert  Hemiplegia and hemiparesis following cerebral infarction affecting right dominant side (HCC) - Plan: Ot plan of care cert/re-cert  Pain in right upper arm - Plan: Ot plan of care cert/re-cert      Subjective Assessment - 10/03/15 1114    Subjective  I don't know why I'm here   Pertinent History most recent CVA 01/2015, h/o CVA 2013 and 2007, CHF, MI 2007 with CABG X4   Patient Stated Goals Get the use of my arm    Currently in Pain? No/denies  however pt does report pain when moving RUE to midrange           [facility] OT Assessment - 10/03/15 0001    Assessment   Diagnosis CVA   Referring Provider Dr. Stephanie Acre   Onset Date --  01/2015   Assessment Rt hemiparesis. Noted learned non use, pt has capability to use RUE functionally to midrange. Pt also without family present and appears to  be poor historian. Pt does not appear to have good family support   Prior Therapy none   Precautions   Precautions None  reported   Balance Screen   Has the patient fallen in the past 6 months No   Has the patient had a decrease in activity level because of a fear of falling?  No   Is the patient reluctant to leave their home because of a fear of falling?  No   Home  Environment   Additional Comments Pt lives on 1st floor apartment. Pt reports having 3 sisters, 2 sisters in [place] where patient lives but they do not visit.    Lives With Alone   Prior Function   Level of Independence Independent    Vocation On disability   ADL   ADL comments Pt reports he does all ADLS I'ly. Cannot hook buttons or tie shoes but wears pull over shirts, elastic pants, and slip on shoes. Pt reports he only pays 1 bill, and only eats cereal fro breakfast and goes out to get dinner. Pt does not eat lunch. Pt takes laundry to sisters house to be performed by sister. Pt reports cleaning house but question accuracy or thoroughness of cleaning. Pt also reports predominantly using Lt hand since CVA   Mobility   Mobility Status Independent   Written Expression   Dominant Hand Right   Handwriting 25% legible   Vision - History   Baseline Vision No visual deficits   Vision Assessment   Comment Reports trouble seeing things up close since CVA but denies bluriness or diplopia   Cognition   Overall Cognitive Status Cognition to be further assessed in functional context PRN  Pt appears to be poor historian, no family present to verify   Sensation   Light Touch Appears Intact   Coordination   9 Hole Peg Test Right;Left   Right 9 Hole Peg Test 1 min. 19.35 sec   Left 9 Hole Peg Test 28.06 sec.    Box and Blocks Rt = 30    Edema   Edema mild Rt hand   ROM / Strength   AROM / PROM / Strength AROM   AROM   Overall AROM Comments LUE WNL's. RUE: shoulder flex 85*, abd 75*, ER approx 90%, IR 50% demo, elbow distally WFL's.    Hand Function   Right Hand Grip (lbs) 65 lbs   Left Hand Grip (lbs) 108 lbs                              OT Long Term Goals - 10/03/15 1219    OT LONG TERM GOAL #1   Title Independent with HEP for RUE (all LTG's due by 12/01/15)   Baseline dependent   Time 8   Period Weeks   Status New   OT LONG TERM GOAL #2   Title Pt to report using RUE 50% or greater for BADLS    Baseline using Lt non-dominant hand (less than 10% Rt hand)   Time 8   Period Weeks   Status New   OT LONG TERM GOAL #3   Title Pt to improve functional use of RUE as evidenced by performing 38 blocks  on Box & Blocks test   Baseline 30   Time 8   Period Weeks   Status New   OT LONG TERM GOAL #4   Title Pt to improve coordination as evidenced by performing  9 hole peg test Rt hand in less than 90 sec.    Baseline 2 minutes and 19 sec   Time 8   Period Weeks   Status New   OT LONG TERM GOAL #5   Title Pt to verbalize understanding with pain reduction strategies RUE and safety techniques RUE   Baseline dependent   Time 8   Period Weeks   Status New               Plan - 10/03/15 1214    Clinical Impression Statement Pt is a 50 y.o. male who presents to outpatient rehab with Rt hemiparesis following CVA 01/2015. Pt reports being hospitalized in [place] at [place] in August 2016 but unsure of dates. Pt has h/o CVA in 2013 and 2007 and at high risk for CVA d/t PMH and co-morbidities. Pt demo functional use of RUE to midrange but has learned non-use and predominantly using LUE at this time.    Pt will benefit from skilled therapeutic intervention in order to improve on the following deficits (Retired) Decreased coordination;Decreased range of motion;Impaired flexibility;Cardiopulmonary status limiting activity;Decreased endurance;Decreased safety awareness;Increased edema;Impaired sensation;Decreased knowledge of use of DME;Impaired UE functional use;Pain;Decreased cognition;Decreased mobility;Decreased strength;Impaired perceived functional ability   Rehab Potential Good   Clinical Impairments Affecting Rehab Potential Poor family support, extensive PMH, poor awareness and decreased pt advocacy   OT Frequency 1x / week  (per pt request) - may adapt frequency and duration depending on MCD authorization   OT Duration 8 weeks  plus evaluation   OT Treatment/Interventions Self-care/ADL training;Therapeutic exercise;Patient/family education;Neuromuscular education;Manual Therapy;Functional Mobility Training;DME and/or AE instruction;Therapeutic activities;Cognitive  remediation/compensation;Passive range of motion;Visual/perceptual remediation/compensation   Plan plan to transfer to outpatient rehab at [facility] for O.T. due to pt's commute, check for MCD authorization, HEP for coordination and functional use RUE   Consulted and Agree with Plan of Care Patient        Problem List Patient Active Problem List   Diagnosis Date Noted  . 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 Diabetes mellitus type 2, insulin dependent (HCC) 07/13/2015  . Hyperlipidemia LDL goal <70 07/13/2015  . CVA (cerebral vascular accident) (HCC) 07/13/2015  . NSTEMI (non-ST elevated myocardial infarction) (HCC) 12/31/2013  . Hypokalemia 04/21/2012  . Thrombocytopenia (HCC) 04/20/2012  . Gastroparesis   . Ischemic cardiomyopathy   . Coronary artery disease   . Hypertension   . NONDEPENDENT TOBACCO USE DISORDER 05/24/2010  . 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 CHRONIC SYSTOLIC HEART FAILURE 05/24/2010  . PVD WITH CLAUDICATION 05/24/2010  . CHRONIC AIRWAY OBSTRUCTION NEC 05/24/2010  . ESOPHAGEAL REFLUX 05/24/2010  . POSTSURGICAL AORTOCORONARY BYPASS STATUS 05/24/2010    Kelli Churn, OTR/L 10/03/2015, 1:32 PM  [facility] [address] [place], [place], [address] Phone: [phone]   Fax:  [phone]  Name: HILMER ALIBERTI MRN: [id number] Date of Birth: Jan 28, 1966

## 2015-10-04 ENCOUNTER — Encounter (INDEPENDENT_AMBULATORY_CARE_PROVIDER_SITE_OTHER): Payer: Self-pay

## 2015-10-04 ENCOUNTER — Ambulatory Visit (INDEPENDENT_AMBULATORY_CARE_PROVIDER_SITE_OTHER): Payer: Medicaid Other | Admitting: Family Medicine

## 2015-10-04 ENCOUNTER — Encounter: Payer: Self-pay | Admitting: Family Medicine

## 2015-10-04 VITALS — BP 135/86 | HR 93 | Temp 98.0°F | Ht 72.0 in | Wt 231.0 lb

## 2015-10-04 DIAGNOSIS — Z794 Long term (current) use of insulin: Secondary | ICD-10-CM

## 2015-10-04 DIAGNOSIS — L723 Sebaceous cyst: Secondary | ICD-10-CM

## 2015-10-04 DIAGNOSIS — L089 Local infection of the skin and subcutaneous tissue, unspecified: Secondary | ICD-10-CM

## 2015-10-04 DIAGNOSIS — E119 Type 2 diabetes mellitus without complications: Secondary | ICD-10-CM

## 2015-10-04 LAB — BAYER DCA HB A1C WAIVED: HB A1C: 9.4 % — AB (ref <7.0)

## 2015-10-04 MED ORDER — SULFAMETHOXAZOLE-TRIMETHOPRIM 800-160 MG PO TABS: 1.0000 | ORAL_TABLET | Freq: Two times a day (BID) | ORAL | Status: DC

## 2015-10-04 NOTE — Progress Notes (Signed)
BP 135/86 mmHg  Pulse 93  Temp(Src) 98 F (36.7 C) (Oral)  Ht 6' (1.829 m)  Wt 231 lb (104.781 kg)  BMI 31.32 kg/m2   Subjective:    Patient ID: Edwin White, male    DOB: 1965-07-20, 50 y.o.   MRN: [id number]  HPI: Edwin White is a 50 y.o. male presenting on 10/04/2015 for Diabetes and Hypertension   HPI Diabetes recheck Patient comes in for diabetes recheck today. His hemoglobin A1c last time was 9.8. His hemoglobin A1c this time came back as 9.4. We did start him on linagliptin-metformin. He says he is not having any issues or side effects from them. He says he is also taking Lantus 50 units at bedtime but when asked again he also said maybe he was doing a 50 units twice a day and is unreliable as to how much he is actually taking or if he is actually taking it. He denies any issues with his eyes or denies any issues with his feet that have arisen since last time. Patient denies headaches, blurred vision, chest pains, shortness of breath, or weakness. Denies any side effects from medication and is content with current medication. He is also on lisinopril and Lipitor. He also takes baby aspirin.  Draining cyst on back Patient has a cyst on his upper back that has been there for 6 months and has had it attempted to be drained twice but has recurred. The drainage attempts were both done at his other offices. More recently over the past 2 weeks the cyst on his back has started swelling and draining again. The drainage has been thicker and is placed a bandage over it. He denies any fevers or chills or cough or shortness of breath or wheezing. He denies any skin lesions anywhere else.  Relevant past medical, surgical, family and social history reviewed and updated as indicated. Interim medical history since our last visit reviewed. Allergies and medications reviewed and updated.  Review of Systems  Constitutional: Negative for fever and chills.  HENT: Negative for ear discharge and ear  pain.   Eyes: Negative for discharge and visual disturbance.  Respiratory: Negative for cough, shortness of breath and wheezing.   Cardiovascular: Negative for chest pain and leg swelling.  Gastrointestinal: Negative for abdominal pain, diarrhea and constipation.  Genitourinary: Negative for difficulty urinating.  Musculoskeletal: Negative for back pain and gait problem.  Skin: Positive for rash and wound.  Neurological: Negative for dizziness, syncope, light-headedness and headaches.  All other systems reviewed and are negative.   Per HPI unless specifically indicated above     Medication List       This list is accurate as of: 10/04/15  1:07 PM.  Always use your most recent med list.               aspirin EC 81 MG tablet  Take 1 tablet (81 mg total) by mouth daily.     atorvastatin 20 MG tablet  Commonly known as:  LIPITOR  Take 1 tablet (20 mg total) by mouth daily.     furosemide 80 MG tablet  Commonly known as:  LASIX  TAKE 1 TABLET BY MOUTH DAILY     gabapentin 400 MG capsule  Commonly known as:  NEURONTIN  Take 1 capsule (400 mg total) by mouth 2 (two) times daily.     Insulin Glargine 100 UNIT/ML Solostar Pen  Commonly known as:  LANTUS SOLOSTAR  Inject 50 Units into the skin  daily at 10 pm.     Linagliptin-Metformin HCl ER 2.10-998 MG Tb24  Take 1 tablet by mouth 2 (two) times daily.     lisinopril 40 MG tablet  Commonly known as:  PRINIVIL,ZESTRIL  Take 1 tablet (40 mg total) by mouth daily.     NIFEdipine 60 MG 24 hr tablet  Commonly known as:  PROCARDIA-XL/ADALAT CC  Take 1 tablet (60 mg total) by mouth daily.     nitroGLYCERIN 0.4 MG SL tablet  Commonly known as:  NITROSTAT  Place 1 tablet (0.4 mg total) under the tongue every 5 (five) minutes x 3 doses as needed for chest pain.     potassium chloride SA 20 MEQ tablet  Commonly known as:  K-DUR,KLOR-CON  TAKE 1 TABLET BY MOUTH DAILY     sulfamethoxazole-trimethoprim 800-160 MG tablet    Commonly known as:  BACTRIM DS  Take 1 tablet by mouth 2 (two) times daily.           Objective:    BP 135/86 mmHg  Pulse 93  Temp(Src) 98 F (36.7 C) (Oral)  Ht 6' (1.829 m)  Wt 231 lb (104.781 kg)  BMI 31.32 kg/m2  Wt Readings from Last 3 Encounters:  10/04/15 231 lb (104.781 kg)  09/07/15 227 lb (102.967 kg)  09/02/15 226 lb 12.8 oz (102.876 kg)    Physical Exam  Constitutional: He is oriented to person, place, and time. He appears well-developed and well-nourished. No distress.  Eyes: Conjunctivae and EOM are normal. Pupils are equal, round, and reactive to light. Right eye exhibits no discharge. No scleral icterus.  Neck: Neck supple. No thyromegaly present.  Cardiovascular: Normal rate, regular rhythm, normal heart sounds and intact distal pulses.   No murmur heard. Pulmonary/Chest: Effort normal and breath sounds normal. No respiratory distress. He has no wheezes.  Musculoskeletal: Normal range of motion. He exhibits no edema.  Lymphadenopathy:    He has no cervical adenopathy.  Neurological: He is alert and oriented to person, place, and time. Coordination normal.  Skin: Skin is warm and dry. Lesion (2 cm round area of swelling and induration with fluctuation with a small opening in the center that is able to express purulent drainage and then turns into thick cheesy drainage. Drainage is malodorous.) noted. No rash noted. He is not diaphoretic.  Psychiatric: He has a normal mood and affect. His behavior is normal.  Vitals reviewed.   Replace dressing on cyst daily, return in 1 week. Already draining no I&D as needed, send culture.    Assessment & Plan:   Problem List Items Addressed This Visit      Endocrine   Diabetes mellitus type 2, insulin dependent (HCC) - Primary   Relevant Orders   Ambulatory referral to Ophthalmology   Microalbumin / creatinine urine ratio   Bayer DCA Hb A1c Waived    Other Visit Diagnoses    Infected sebaceous cyst         Relevant Medications    sulfamethoxazole-trimethoprim (BACTRIM DS) 800-160 MG tablet    Other Relevant Orders    Anaerobic and Aerobic Culture        Follow up plan: Return in about 1 week (around 10/11/2015), or if symptoms worsen or fail to improve, for Recheck sebaceous cyst infection.  Counseling provided for all of the vaccine components Orders Placed This Encounter  Procedures  . Anaerobic and Aerobic Culture  . Microalbumin / creatinine urine ratio  . Bayer DCA Hb A1c Waived  .  Ambulatory referral to Ophthalmology    Arville Care, MD [facility] 10/04/2015, 1:07 PM

## 2015-10-05 LAB — MICROALBUMIN / CREATININE URINE RATIO
Creatinine, Urine: 146.8 mg/dL
MICROALB/CREAT RATIO: 89 mg/g creat — ABNORMAL HIGH (ref 0.0–30.0)
Microalbumin, Urine: 130.7 ug/mL

## 2015-10-08 LAB — ANAEROBIC AND AEROBIC CULTURE

## 2015-10-12 ENCOUNTER — Ambulatory Visit: Payer: Medicaid Other | Admitting: Family Medicine

## 2015-10-17 ENCOUNTER — Ambulatory Visit (HOSPITAL_COMMUNITY): Payer: Medicaid Other

## 2015-11-04 ENCOUNTER — Ambulatory Visit (INDEPENDENT_AMBULATORY_CARE_PROVIDER_SITE_OTHER): Payer: Medicaid Other | Admitting: Family Medicine

## 2015-11-04 ENCOUNTER — Encounter: Payer: Self-pay | Admitting: Family Medicine

## 2015-11-04 VITALS — BP 118/79 | HR 84 | Temp 97.0°F | Ht 72.0 in | Wt 224.8 lb

## 2015-11-04 DIAGNOSIS — Z794 Long term (current) use of insulin: Secondary | ICD-10-CM | POA: Diagnosis not present

## 2015-11-04 DIAGNOSIS — L089 Local infection of the skin and subcutaneous tissue, unspecified: Secondary | ICD-10-CM

## 2015-11-04 DIAGNOSIS — L723 Sebaceous cyst: Secondary | ICD-10-CM

## 2015-11-04 DIAGNOSIS — E119 Type 2 diabetes mellitus without complications: Secondary | ICD-10-CM

## 2015-11-04 MED ORDER — CANAGLIFLOZIN 100 MG PO TABS: 100.0000 mg | ORAL_TABLET | Freq: Every day | ORAL | Status: DC

## 2015-11-04 NOTE — Progress Notes (Signed)
BP 118/79 mmHg  Pulse 84  Temp(Src) 97 F (36.1 C) (Oral)  Ht 6' (1.829 m)  Wt 224 lb 12.8 oz (101.969 kg)  BMI 30.48 kg/m2   Subjective:    Patient ID: Edwin White, male    DOB: 24-Feb-1966, 50 y.o.   MRN: [id number]  HPI: Edwin White is a 50 y.o. male presenting on 11/04/2015 for Diabetes   HPI Follow-up infected sebaceous cyst Patient is coming in to follow up for infectious sebaceous cyst. The cyst on his back has not been draining any further. He denies any fevers or chills. He finished antibiotic course a few weeks ago.  Type 2 diabetes follow-up Patient says his diabetes is consistently running over 200 in the morning when he checks and throughout the day. Even higher. He denies any hypoglycemic episodes. He does say that he is using his insulin/Lantus every evening, 50 units. He also says that he started up on the new medication that we sent for his diabetes. He says he is taking it every night every morning. His hemoglobin A1c last time he was here was 9.4 and prior to that it was 9.8. He denies any new issues of feet. He denies any new issues with his eyes. He has not seen an ophthalmologist yet this year.  Relevant past medical, surgical, family and social history reviewed and updated as indicated. Interim medical history since our last visit reviewed. Allergies and medications reviewed and updated.  Review of Systems  Constitutional: Negative for fever and chills.  HENT: Negative for congestion, ear discharge and ear pain.   Eyes: Negative for discharge and visual disturbance.  Respiratory: Negative for shortness of breath and wheezing.   Cardiovascular: Negative for chest pain and leg swelling.  Gastrointestinal: Negative for abdominal pain, diarrhea and constipation.  Genitourinary: Negative for difficulty urinating.  Musculoskeletal: Negative for back pain and gait problem.  Skin: Negative for color change, rash and wound.  Neurological: Negative for syncope,  light-headedness and headaches.  All other systems reviewed and are negative.   Per HPI unless specifically indicated above     Medication List       This list is accurate as of: 11/04/15  1:16 PM.  Always use your most recent med list.               aspirin EC 81 MG tablet  Take 1 tablet (81 mg total) by mouth daily.     atorvastatin 20 MG tablet  Commonly known as:  LIPITOR  Take 1 tablet (20 mg total) by mouth daily.     canagliflozin 100 MG Tabs tablet  Commonly known as:  INVOKANA  Take 1 tablet (100 mg total) by mouth daily before breakfast.     furosemide 80 MG tablet  Commonly known as:  LASIX  TAKE 1 TABLET BY MOUTH DAILY     gabapentin 400 MG capsule  Commonly known as:  NEURONTIN  Take 1 capsule (400 mg total) by mouth 2 (two) times daily.     Insulin Glargine 100 UNIT/ML Solostar Pen  Commonly known as:  LANTUS SOLOSTAR  Inject 50 Units into the skin daily at 10 pm.     Linagliptin-Metformin HCl ER 2.10-998 MG Tb24  Take 1 tablet by mouth 2 (two) times daily.     lisinopril 40 MG tablet  Commonly known as:  PRINIVIL,ZESTRIL  Take 1 tablet (40 mg total) by mouth daily.     NIFEdipine 60 MG 24 hr tablet  Commonly known as:  PROCARDIA-XL/ADALAT CC  Take 1 tablet (60 mg total) by mouth daily.     nitroGLYCERIN 0.4 MG SL tablet  Commonly known as:  NITROSTAT  Place 1 tablet (0.4 mg total) under the tongue every 5 (five) minutes x 3 doses as needed for chest pain.     potassium chloride SA 20 MEQ tablet  Commonly known as:  K-DUR,KLOR-CON  TAKE 1 TABLET BY MOUTH DAILY           Objective:    BP 118/79 mmHg  Pulse 84  Temp(Src) 97 F (36.1 C) (Oral)  Ht 6' (1.829 m)  Wt 224 lb 12.8 oz (101.969 kg)  BMI 30.48 kg/m2  Wt Readings from Last 3 Encounters:  11/04/15 224 lb 12.8 oz (101.969 kg)  10/04/15 231 lb (104.781 kg)  09/07/15 227 lb (102.967 kg)    Physical Exam  Constitutional: He is oriented to person, place, and time. He appears  well-developed and well-nourished. No distress.  Eyes: Conjunctivae and EOM are normal. Pupils are equal, round, and reactive to light. Right eye exhibits no discharge. No scleral icterus.  Neck: Neck supple. No thyromegaly present.  Cardiovascular: Normal rate, regular rhythm, normal heart sounds and intact distal pulses.   No murmur heard. Pulmonary/Chest: Effort normal and breath sounds normal. No respiratory distress. He has no wheezes.  Abdominal: He exhibits no distension. There is no tenderness. There is no rebound.  Musculoskeletal: Normal range of motion. He exhibits no edema.  Lymphadenopathy:    He has no cervical adenopathy.  Neurological: He is alert and oriented to person, place, and time. Coordination normal.  Skin: Skin is warm and dry. Lesion (Well healed and scarred over lesion on back where the sebaceous cyst was., No drainage.) noted. No rash noted. He is not diaphoretic.  Psychiatric: He has a normal mood and affect. His behavior is normal.  Vitals reviewed.   Results for orders placed or performed in visit on 10/04/15  Anaerobic and Aerobic Culture  Result Value Ref Range   Anaerobic Culture Final report    Result 1 Comment    Aerobic Culture Final report    Result 1 Mixed skin flora   Microalbumin / creatinine urine ratio  Result Value Ref Range   Creatinine, Urine 146.8 Not Estab. mg/dL   Microalbum.,U,Random 130.7 Not Estab. ug/mL   MICROALB/CREAT RATIO 89.0 (H) 0.0 - 30.0 mg/g creat  Bayer DCA Hb A1c Waived  Result Value Ref Range   Bayer DCA Hb A1c Waived 9.4 (H) <7.0 %      Assessment & Plan:   Problem List Items Addressed This Visit      Endocrine   Diabetes mellitus type 2, insulin dependent (HCC)   Relevant Medications   canagliflozin (INVOKANA) 100 MG TABS tablet    Other Visit Diagnoses    Infected sebaceous cyst    -  Primary    Resolved        Follow up plan: Return in about 2 months (around 01/04/2016), or if symptoms worsen or fail  to improve, for Diabetes recheck.  Counseling provided for all of the vaccine components No orders of the defined types were placed in this encounter.    Arville Care, MD [facility] 11/04/2015, 1:16 PM

## 2015-11-07 ENCOUNTER — Telehealth: Payer: Self-pay | Admitting: Family Medicine

## 2015-11-10 NOTE — Telephone Encounter (Signed)
Received phone call from the owner of apartment building from where patient lives.  He states that patient is not doing good.  He is not eating well and is losing weight.  Attempted to contact patient and patient's sister.  No answer from either.

## 2015-11-14 NOTE — Telephone Encounter (Signed)
Spoke with Opal and she is no longer on his HIPPA form so she is aware I can't disclose any information to her. She did say she just saw the pt yesterday and that he had told her he was doing fine.

## 2015-11-29 ENCOUNTER — Other Ambulatory Visit: Payer: Self-pay

## 2015-12-01 ENCOUNTER — Telehealth: Payer: Self-pay

## 2015-12-01 NOTE — Telephone Encounter (Signed)
Insurance authorized [employer]  [id number]

## 2016-01-09 ENCOUNTER — Encounter: Payer: Self-pay | Admitting: Family Medicine

## 2016-01-09 ENCOUNTER — Ambulatory Visit (INDEPENDENT_AMBULATORY_CARE_PROVIDER_SITE_OTHER): Payer: Medicaid Other | Admitting: Family Medicine

## 2016-01-09 VITALS — BP 124/78 | HR 97 | Temp 97.0°F | Ht 72.0 in | Wt 229.2 lb

## 2016-01-09 DIAGNOSIS — E119 Type 2 diabetes mellitus without complications: Secondary | ICD-10-CM | POA: Diagnosis not present

## 2016-01-09 DIAGNOSIS — Z114 Encounter for screening for human immunodeficiency virus [HIV]: Secondary | ICD-10-CM

## 2016-01-09 DIAGNOSIS — I10 Essential (primary) hypertension: Secondary | ICD-10-CM

## 2016-01-09 DIAGNOSIS — Z794 Long term (current) use of insulin: Secondary | ICD-10-CM | POA: Diagnosis not present

## 2016-01-09 DIAGNOSIS — Z1159 Encounter for screening for other viral diseases: Secondary | ICD-10-CM

## 2016-01-09 DIAGNOSIS — E785 Hyperlipidemia, unspecified: Secondary | ICD-10-CM | POA: Diagnosis not present

## 2016-01-09 LAB — BAYER DCA HB A1C WAIVED: HB A1C (BAYER DCA - WAIVED): 8.9 % — ABNORMAL HIGH (ref <7.0)

## 2016-01-09 MED ORDER — ACCU-CHEK AVIVA DEVI: Status: DC

## 2016-01-09 NOTE — Progress Notes (Signed)
BP 124/78 mmHg  Pulse 97  Temp(Src) 97 F (36.1 C) (Oral)  Ht 6' (1.829 m)  Wt 229 lb 3.2 oz (103.964 kg)  BMI 31.08 kg/m2   Subjective:    Patient ID: Edwin White, male    DOB: [birth year], 50 y.o.   MRN: [id number]  HPI: Edwin White is a 50 y.o. male presenting on 01/09/2016 for Diabetes and Hypertension   HPI Hypertension Patient comes in today for hypertension recheck. His blood pressure today is 124/78. He is currently taking Procardia and lisinopril. Patient denies headaches, blurred vision, chest pains, shortness of breath. Denies any side effects from medication and is content with current medication. He does have residual right-sided weakness from his stroke. That has been unchanged.  Diabetes recheck Patient is coming in for diabetes recheck today. His last A1c went up instead of going down and wanted to recheck where he was today. He brings his medications with him and he actually has it set up from the pharmacy where he has the medications a little packages that they deliver to home with all of his medications on a day-to-day package basis. In the boxes it looks like the linagliptin-metformin is not in there. We'll call the pharmacy to see what the confusion is about this. Patient has not seen an ophthalmologist yet this year. Patient is currently on an ACE inhibitor. Patient is also on a statin. Patient denies any new issues with his feet.  Hyperlipidemia Patient is coming in today for a cholesterol recheck. He is currently on atorvastatin. He denies any issues such as myalgias or liver issues that he knows of from this medication.  Relevant past medical, surgical, family and social history reviewed and updated as indicated. Interim medical history since our last visit reviewed. Allergies and medications reviewed and updated.  Review of Systems  Constitutional: Negative for fever.  HENT: Negative for ear discharge and ear pain.   Eyes: Negative for discharge and  visual disturbance.  Respiratory: Negative for shortness of breath and wheezing.   Cardiovascular: Negative for chest pain and leg swelling.  Gastrointestinal: Negative for abdominal pain, diarrhea and constipation.  Genitourinary: Negative for difficulty urinating.  Musculoskeletal: Negative for back pain and gait problem.  Skin: Negative for color change, pallor and rash.  Neurological: Positive for weakness. Negative for dizziness, syncope, light-headedness and headaches.  All other systems reviewed and are negative.   Per HPI unless specifically indicated above     Medication List       This list is accurate as of: 01/09/16  1:17 PM.  Always use your most recent med list.               ACCU-CHEK AVIVA device  Use as instructed     aspirin EC 81 MG tablet  Take 1 tablet (81 mg total) by mouth daily.     atorvastatin 20 MG tablet  Commonly known as:  LIPITOR  Take 1 tablet (20 mg total) by mouth daily.     canagliflozin 100 MG Tabs tablet  Commonly known as:  INVOKANA  Take 1 tablet (100 mg total) by mouth daily before breakfast.     furosemide 80 MG tablet  Commonly known as:  LASIX  TAKE 1 TABLET BY MOUTH DAILY     gabapentin 400 MG capsule  Commonly known as:  NEURONTIN  Take 1 capsule (400 mg total) by mouth 2 (two) times daily.     Insulin Glargine 100 UNIT/ML Solostar Pen  Commonly known as:  LANTUS SOLOSTAR  Inject 50 Units into the skin daily at 10 pm.     Linagliptin-Metformin HCl ER 2.10-998 MG Tb24  Take 1 tablet by mouth 2 (two) times daily.     lisinopril 40 MG tablet  Commonly known as:  PRINIVIL,ZESTRIL  Take 1 tablet (40 mg total) by mouth daily.     NIFEdipine 60 MG 24 hr tablet  Commonly known as:  PROCARDIA-XL/ADALAT CC  Take 1 tablet (60 mg total) by mouth daily.     nitroGLYCERIN 0.4 MG SL tablet  Commonly known as:  NITROSTAT  Place 1 tablet (0.4 mg total) under the tongue every 5 (five) minutes x 3 doses as needed for chest pain.       potassium chloride SA 20 MEQ tablet  Commonly known as:  K-DUR,KLOR-CON  TAKE 1 TABLET BY MOUTH DAILY           Objective:    BP 124/78 mmHg  Pulse 97  Temp(Src) 97 F (36.1 C) (Oral)  Ht 6' (1.829 m)  Wt 229 lb 3.2 oz (103.964 kg)  BMI 31.08 kg/m2  Wt Readings from Last 3 Encounters:  01/09/16 229 lb 3.2 oz (103.964 kg)  11/04/15 224 lb 12.8 oz (101.969 kg)  10/04/15 231 lb (104.781 kg)    Physical Exam  Constitutional: He is oriented to person, place, and time. He appears well-developed and well-nourished. No distress.  Eyes: Conjunctivae and EOM are normal. Pupils are equal, round, and reactive to light. Right eye exhibits no discharge. No scleral icterus.  Neck: Neck supple. No thyromegaly present.  Cardiovascular: Normal rate, regular rhythm, normal heart sounds and intact distal pulses.   No murmur heard. Pulmonary/Chest: Effort normal and breath sounds normal. No respiratory distress. He has no wheezes.  Musculoskeletal: Normal range of motion. He exhibits no edema.  Lymphadenopathy:    He has no cervical adenopathy.  Neurological: He is alert and oriented to person, place, and time. Coordination normal.  Skin: Skin is warm and dry. No rash noted. He is not diaphoretic.  Psychiatric: He has a normal mood and affect. His behavior is normal.  Nursing note and vitals reviewed.     Assessment & Plan:   Problem List Items Addressed This Visit      Cardiovascular and Mediastinum   Hypertension - Primary   Relevant Orders   CMP14+EGFR     Endocrine   Diabetes mellitus type 2, insulin dependent (HCC)   Relevant Medications   Blood Glucose Monitoring Suppl (ACCU-CHEK AVIVA) device   Other Relevant Orders   Bayer [place] Hb A1c Waived   CMP14+EGFR   Ambulatory referral to Ophthalmology     Other   Hyperlipidemia LDL goal <70   Relevant Orders   Lipid panel    Other Visit Diagnoses    Need for hepatitis C screening test        Relevant Orders    Hepatitis  C antibody    Screening for HIV without presence of risk factors        Relevant Orders    HIV antibody        Follow up plan: Return in about 3 months (around 04/10/2016), or if symptoms worsen or fail to improve, for Diabetes recheck and hypertension.  Counseling provided for all of the vaccine components Orders Placed This Encounter  Procedures  . Bayer [place] Hb A1c Waived  . CMP14+EGFR  . Lipid panel  . Hepatitis C antibody  . HIV antibody  Caryl Pina, MD [facility] 01/09/2016, 1:17 PM

## 2016-01-09 NOTE — Addendum Note (Signed)
Addended by: Tamera PuntWRAY, Nollie Terlizzi S on: 01/09/2016 01:33 PM   Modules accepted: Kipp BroodSmartSet

## 2016-01-10 LAB — CMP14+EGFR
A/G RATIO: 1.8 (ref 1.2–2.2)
ALT: 18 IU/L (ref 0–44)
AST: 13 IU/L (ref 0–40)
Albumin: 4.8 g/dL (ref 3.5–5.5)
Alkaline Phosphatase: 69 IU/L (ref 39–117)
BILIRUBIN TOTAL: 0.4 mg/dL (ref 0.0–1.2)
BUN / CREAT RATIO: 14 (ref 9–20)
BUN: 16 mg/dL (ref 6–24)
CHLORIDE: 94 mmol/L — AB (ref 96–106)
CO2: 25 mmol/L (ref 18–29)
Calcium: 9.9 mg/dL (ref 8.7–10.2)
Creatinine, Ser: 1.13 mg/dL (ref 0.76–1.27)
GFR calc non Af Amer: 75 mL/min/{1.73_m2} (ref >59)
GFR, EST AFRICAN AMERICAN: 87 mL/min/{1.73_m2} (ref >59)
GLOBULIN, TOTAL: 2.6 g/dL (ref 1.5–4.5)
Glucose: 252 mg/dL — ABNORMAL HIGH (ref 65–99)
Potassium: 4.1 mmol/L (ref 3.5–5.2)
Sodium: 136 mmol/L (ref 134–144)
TOTAL PROTEIN: 7.4 g/dL (ref 6.0–8.5)

## 2016-01-10 LAB — HIV ANTIBODY (ROUTINE TESTING W REFLEX): HIV SCREEN 4TH GENERATION: NONREACTIVE

## 2016-01-10 LAB — LIPID PANEL
CHOL/HDL RATIO: 5.6 ratio — AB (ref 0.0–5.0)
Cholesterol, Total: 211 mg/dL — ABNORMAL HIGH (ref 100–199)
HDL: 38 mg/dL — AB (ref >39)
LDL Calculated: 139 mg/dL — ABNORMAL HIGH (ref 0–99)
Triglycerides: 171 mg/dL — ABNORMAL HIGH (ref 0–149)
VLDL Cholesterol Cal: 34 mg/dL (ref 5–40)

## 2016-01-10 LAB — HEPATITIS C ANTIBODY

## 2016-01-19 ENCOUNTER — Other Ambulatory Visit: Payer: Self-pay | Admitting: Cardiovascular Disease

## 2016-01-19 ENCOUNTER — Other Ambulatory Visit: Payer: Self-pay | Admitting: Family Medicine

## 2016-01-30 ENCOUNTER — Telehealth: Payer: Self-pay

## 2016-01-30 ENCOUNTER — Ambulatory Visit: Payer: Medicaid Other | Admitting: Neurology

## 2016-01-30 ENCOUNTER — Encounter: Payer: Self-pay | Admitting: *Deleted

## 2016-01-30 NOTE — Telephone Encounter (Signed)
Pt no-showed his follow-up appt today. 

## 2016-02-01 ENCOUNTER — Encounter: Payer: Self-pay | Admitting: Neurology

## 2016-02-05 ENCOUNTER — Encounter (HOSPITAL_COMMUNITY): Payer: Self-pay

## 2016-02-05 ENCOUNTER — Emergency Department (HOSPITAL_COMMUNITY): Payer: Medicaid Other

## 2016-02-05 ENCOUNTER — Inpatient Hospital Stay (HOSPITAL_COMMUNITY)
Admission: EM | Admit: 2016-02-05 | Discharge: 2016-02-08 | DRG: 065 | Disposition: A | Discharge status: Home Health | Payer: Medicaid Other | Attending: Internal Medicine | Admitting: Internal Medicine

## 2016-02-05 DIAGNOSIS — I5042 Chronic combined systolic (congestive) and diastolic (congestive) heart failure: Secondary | ICD-10-CM | POA: Diagnosis present

## 2016-02-05 DIAGNOSIS — Z955 Presence of coronary angioplasty implant and graft: Secondary | ICD-10-CM | POA: Diagnosis not present

## 2016-02-05 DIAGNOSIS — Z9119 Patient's noncompliance with other medical treatment and regimen: Secondary | ICD-10-CM | POA: Diagnosis not present

## 2016-02-05 DIAGNOSIS — Z8249 Family history of ischemic heart disease and other diseases of the circulatory system: Secondary | ICD-10-CM | POA: Diagnosis not present

## 2016-02-05 DIAGNOSIS — R531 Weakness: Secondary | ICD-10-CM | POA: Diagnosis not present

## 2016-02-05 DIAGNOSIS — G8191 Hemiplegia, unspecified affecting right dominant side: Secondary | ICD-10-CM | POA: Diagnosis present

## 2016-02-05 DIAGNOSIS — K3184 Gastroparesis: Secondary | ICD-10-CM | POA: Diagnosis present

## 2016-02-05 DIAGNOSIS — I252 Old myocardial infarction: Secondary | ICD-10-CM | POA: Diagnosis not present

## 2016-02-05 DIAGNOSIS — E785 Hyperlipidemia, unspecified: Secondary | ICD-10-CM | POA: Diagnosis present

## 2016-02-05 DIAGNOSIS — I6789 Other cerebrovascular disease: Secondary | ICD-10-CM | POA: Diagnosis not present

## 2016-02-05 DIAGNOSIS — Z79899 Other long term (current) drug therapy: Secondary | ICD-10-CM

## 2016-02-05 DIAGNOSIS — E1165 Type 2 diabetes mellitus with hyperglycemia: Secondary | ICD-10-CM | POA: Diagnosis present

## 2016-02-05 DIAGNOSIS — Z720 Tobacco use: Secondary | ICD-10-CM

## 2016-02-05 DIAGNOSIS — F17210 Nicotine dependence, cigarettes, uncomplicated: Secondary | ICD-10-CM | POA: Diagnosis present

## 2016-02-05 DIAGNOSIS — E1143 Type 2 diabetes mellitus with diabetic autonomic (poly)neuropathy: Secondary | ICD-10-CM | POA: Diagnosis present

## 2016-02-05 DIAGNOSIS — R1310 Dysphagia, unspecified: Secondary | ICD-10-CM | POA: Diagnosis present

## 2016-02-05 DIAGNOSIS — I6330 Cerebral infarction due to thrombosis of unspecified cerebral artery: Secondary | ICD-10-CM | POA: Diagnosis present

## 2016-02-05 DIAGNOSIS — I639 Cerebral infarction, unspecified: Secondary | ICD-10-CM | POA: Diagnosis present

## 2016-02-05 DIAGNOSIS — Z794 Long term (current) use of insulin: Secondary | ICD-10-CM

## 2016-02-05 DIAGNOSIS — I63412 Cerebral infarction due to embolism of left middle cerebral artery: Secondary | ICD-10-CM | POA: Diagnosis not present

## 2016-02-05 DIAGNOSIS — I255 Ischemic cardiomyopathy: Secondary | ICD-10-CM | POA: Diagnosis present

## 2016-02-05 DIAGNOSIS — Z951 Presence of aortocoronary bypass graft: Secondary | ICD-10-CM | POA: Diagnosis not present

## 2016-02-05 DIAGNOSIS — F809 Developmental disorder of speech and language, unspecified: Secondary | ICD-10-CM | POA: Diagnosis present

## 2016-02-05 DIAGNOSIS — Z7982 Long term (current) use of aspirin: Secondary | ICD-10-CM

## 2016-02-05 DIAGNOSIS — I110 Hypertensive heart disease with heart failure: Secondary | ICD-10-CM | POA: Diagnosis present

## 2016-02-05 DIAGNOSIS — I69320 Aphasia following cerebral infarction: Secondary | ICD-10-CM

## 2016-02-05 DIAGNOSIS — I513 Intracardiac thrombosis, not elsewhere classified: Secondary | ICD-10-CM | POA: Diagnosis present

## 2016-02-05 DIAGNOSIS — I5022 Chronic systolic (congestive) heart failure: Secondary | ICD-10-CM | POA: Diagnosis not present

## 2016-02-05 DIAGNOSIS — K219 Gastro-esophageal reflux disease without esophagitis: Secondary | ICD-10-CM | POA: Diagnosis present

## 2016-02-05 DIAGNOSIS — I2510 Atherosclerotic heart disease of native coronary artery without angina pectoris: Secondary | ICD-10-CM | POA: Diagnosis present

## 2016-02-05 DIAGNOSIS — Z809 Family history of malignant neoplasm, unspecified: Secondary | ICD-10-CM | POA: Diagnosis not present

## 2016-02-05 DIAGNOSIS — I10 Essential (primary) hypertension: Secondary | ICD-10-CM | POA: Diagnosis not present

## 2016-02-05 LAB — CBC
HEMATOCRIT: 46.9 % (ref 39.0–52.0)
Hemoglobin: 16.6 g/dL (ref 13.0–17.0)
MCH: 31 pg (ref 26.0–34.0)
MCHC: 35.4 g/dL (ref 30.0–36.0)
MCV: 87.5 fL (ref 78.0–100.0)
Platelets: 162 10*3/uL (ref 150–400)
RBC: 5.36 MIL/uL (ref 4.22–5.81)
RDW: 13.5 % (ref 11.5–15.5)
WBC: 7.2 10*3/uL (ref 4.0–10.5)

## 2016-02-05 LAB — DIFFERENTIAL
BASOS PCT: 0 %
Basophils Absolute: 0 10*3/uL (ref 0.0–0.1)
EOS ABS: 0.1 10*3/uL (ref 0.0–0.7)
EOS PCT: 1 %
Lymphocytes Relative: 30 %
Lymphs Abs: 2.1 10*3/uL (ref 0.7–4.0)
MONO ABS: 0.4 10*3/uL (ref 0.1–1.0)
MONOS PCT: 6 %
Neutro Abs: 4.5 10*3/uL (ref 1.7–7.7)
Neutrophils Relative %: 63 %

## 2016-02-05 LAB — APTT: APTT: 27 s (ref 24–36)

## 2016-02-05 LAB — PROTIME-INR
INR: 0.98
Prothrombin Time: 12.9 seconds (ref 11.4–15.2)

## 2016-02-05 LAB — I-STAT CHEM 8, ED
BUN: 14 mg/dL (ref 6–20)
CREATININE: 1 mg/dL (ref 0.61–1.24)
Calcium, Ion: 1.1 mmol/L — ABNORMAL LOW (ref 1.13–1.30)
Chloride: 97 mmol/L — ABNORMAL LOW (ref 101–111)
GLUCOSE: 232 mg/dL — AB (ref 65–99)
HCT: 48 % (ref 39.0–52.0)
HEMOGLOBIN: 16.3 g/dL (ref 13.0–17.0)
Potassium: 3.8 mmol/L (ref 3.5–5.1)
Sodium: 137 mmol/L (ref 135–145)
TCO2: 23 mmol/L (ref 0–100)

## 2016-02-05 LAB — COMPREHENSIVE METABOLIC PANEL
ALT: 21 U/L (ref 17–63)
AST: 22 U/L (ref 15–41)
Albumin: 4.2 g/dL (ref 3.5–5.0)
Alkaline Phosphatase: 79 U/L (ref 38–126)
Anion gap: 9 (ref 5–15)
BUN: 15 mg/dL (ref 6–20)
CHLORIDE: 96 mmol/L — AB (ref 101–111)
CO2: 23 mmol/L (ref 22–32)
Calcium: 8.4 mg/dL — ABNORMAL LOW (ref 8.9–10.3)
Creatinine, Ser: 1.19 mg/dL (ref 0.61–1.24)
Glucose, Bld: 234 mg/dL — ABNORMAL HIGH (ref 65–99)
POTASSIUM: 3.6 mmol/L (ref 3.5–5.1)
Sodium: 128 mmol/L — ABNORMAL LOW (ref 135–145)
Total Bilirubin: 0.6 mg/dL (ref 0.3–1.2)
Total Protein: 7.5 g/dL (ref 6.5–8.1)

## 2016-02-05 LAB — I-STAT TROPONIN, ED: TROPONIN I, POC: 0.03 ng/mL (ref 0.00–0.08)

## 2016-02-05 LAB — GLUCOSE, CAPILLARY: Glucose-Capillary: 248 mg/dL — ABNORMAL HIGH (ref 65–99)

## 2016-02-05 LAB — ETHANOL

## 2016-02-05 MED ORDER — CARVEDILOL 12.5 MG PO TABS
12.5000 mg | ORAL_TABLET | Freq: Two times a day (BID) | ORAL | Status: DC
  Administered 2016-02-05 – 2016-02-08 (×4): 12.5 mg via ORAL
  Filled 2016-02-05 (×5): qty 1

## 2016-02-05 MED ORDER — CANAGLIFLOZIN 100 MG PO TABS
100.0000 mg | ORAL_TABLET | Freq: Every day | ORAL | Status: DC
Start: 2016-02-06 — End: 2016-02-08
  Administered 2016-02-07 – 2016-02-08 (×2): 100 mg via ORAL
  Filled 2016-02-05 (×5): qty 1

## 2016-02-05 MED ORDER — INSULIN ASPART 100 UNIT/ML ~~LOC~~ SOLN
0.0000 [IU] | Freq: Three times a day (TID) | SUBCUTANEOUS | Status: DC
  Administered 2016-02-06: 5 [IU] via SUBCUTANEOUS
  Administered 2016-02-06 (×2): 2 [IU] via SUBCUTANEOUS
  Administered 2016-02-07: 1 [IU] via SUBCUTANEOUS
  Administered 2016-02-07 (×2): 2 [IU] via SUBCUTANEOUS

## 2016-02-05 MED ORDER — ASPIRIN EC 81 MG PO TBEC
81.0000 mg | DELAYED_RELEASE_TABLET | Freq: Every day | ORAL | Status: DC
  Administered 2016-02-05 – 2016-02-08 (×4): 81 mg via ORAL
  Filled 2016-02-05 (×4): qty 1

## 2016-02-05 MED ORDER — INSULIN GLARGINE 100 UNIT/ML ~~LOC~~ SOLN
50.0000 [IU] | Freq: Every day | SUBCUTANEOUS | Status: DC
  Administered 2016-02-06 – 2016-02-07 (×2): 50 [IU] via SUBCUTANEOUS
  Filled 2016-02-05 (×4): qty 0.5

## 2016-02-05 MED ORDER — NITROGLYCERIN 0.4 MG SL SUBL: 0.4000 mg | SUBLINGUAL_TABLET | SUBLINGUAL | Status: DC | PRN

## 2016-02-05 MED ORDER — LISINOPRIL 10 MG PO TABS
40.0000 mg | ORAL_TABLET | Freq: Every day | ORAL | Status: DC
  Administered 2016-02-05 – 2016-02-08 (×4): 40 mg via ORAL
  Filled 2016-02-05 (×3): qty 4

## 2016-02-05 MED ORDER — GABAPENTIN 400 MG PO CAPS
400.0000 mg | ORAL_CAPSULE | Freq: Two times a day (BID) | ORAL | Status: DC
  Administered 2016-02-05 – 2016-02-08 (×6): 400 mg via ORAL
  Filled 2016-02-05 (×6): qty 1

## 2016-02-05 MED ORDER — METFORMIN HCL ER 500 MG PO TB24
1000.0000 mg | ORAL_TABLET | Freq: Two times a day (BID) | ORAL | Status: DC
  Administered 2016-02-06 – 2016-02-08 (×4): 1000 mg via ORAL
  Filled 2016-02-05 (×4): qty 2

## 2016-02-05 MED ORDER — POTASSIUM CHLORIDE CRYS ER 20 MEQ PO TBCR
20.0000 meq | EXTENDED_RELEASE_TABLET | Freq: Every day | ORAL | Status: DC
  Administered 2016-02-05 – 2016-02-08 (×4): 20 meq via ORAL
  Filled 2016-02-05 (×4): qty 1

## 2016-02-05 MED ORDER — SENNOSIDES-DOCUSATE SODIUM 8.6-50 MG PO TABS: 1.0000 | ORAL_TABLET | Freq: Every evening | ORAL | Status: DC | PRN

## 2016-02-05 MED ORDER — LINAGLIPTIN 5 MG PO TABS
2.5000 mg | ORAL_TABLET | Freq: Two times a day (BID) | ORAL | Status: DC
  Administered 2016-02-06 – 2016-02-08 (×4): 2.5 mg via ORAL
  Filled 2016-02-05 (×4): qty 1

## 2016-02-05 MED ORDER — FUROSEMIDE 80 MG PO TABS
80.0000 mg | ORAL_TABLET | Freq: Every day | ORAL | Status: DC
  Administered 2016-02-05 – 2016-02-08 (×4): 80 mg via ORAL
  Filled 2016-02-05 (×4): qty 1

## 2016-02-05 MED ORDER — ATORVASTATIN CALCIUM 20 MG PO TABS
20.0000 mg | ORAL_TABLET | Freq: Every day | ORAL | Status: DC
  Administered 2016-02-05 – 2016-02-07 (×3): 20 mg via ORAL
  Filled 2016-02-05 (×2): qty 1

## 2016-02-05 MED ORDER — LINAGLIPTIN-METFORMIN HCL ER 2.5-1000 MG PO TB24: 1.0000 | ORAL_TABLET | Freq: Two times a day (BID) | ORAL | Status: DC

## 2016-02-05 MED ORDER — STROKE: EARLY STAGES OF RECOVERY BOOK
Freq: Once | Status: DC
  Filled 2016-02-05: qty 1

## 2016-02-05 MED ORDER — INSULIN ASPART 100 UNIT/ML ~~LOC~~ SOLN
0.0000 [IU] | Freq: Every day | SUBCUTANEOUS | Status: DC
  Administered 2016-02-05: 3 [IU] via SUBCUTANEOUS

## 2016-02-05 MED ORDER — ENOXAPARIN SODIUM 40 MG/0.4ML ~~LOC~~ SOLN
40.0000 mg | SUBCUTANEOUS | Status: DC
  Administered 2016-02-05 – 2016-02-06 (×2): 40 mg via SUBCUTANEOUS
  Filled 2016-02-05 (×2): qty 0.4

## 2016-02-05 NOTE — ED Notes (Signed)
Teleneuro assessment complete, neurologist confirmed pt is not a candidate for tpa

## 2016-02-05 NOTE — ED Notes (Signed)
Paged code stroke at 17:19; called SOC at 17:23

## 2016-02-05 NOTE — ED Notes (Signed)
Pt placed on 2L for 02 sats 90%. PT now at 99% on 2L [place].

## 2016-02-05 NOTE — H&P (Signed)
History and Physical  Edwin White:[id number] DOB: July 10, 1965 DOA: 02/05/2016  Referring physician: ER Physician PCP: Elige Radon Dettinger, MD  Outpatient Specialists:    Patient coming from: Home  Chief Complaint: Right sided weakness and speech difficulties  HPI: 50 year old male with history of DM, stroke, NSTEMI, post infarct apical thrombus, ischemic cardiomyopathy, non compliance and gastroparesis. Patient has speech problems and could not or would not give much history. There is likely component of word finding problems. No collateral informant available. Patient presents to the ER with right sided weakness and speech difficulties. Time of onset of symptoms is unclear. ER physician has discussed with the Neurologist on call and it has been decided that the patient will be admitted to this hospital. CT head result is not clear. No headache, no neck pain, no SOB, no fever or chills, no GI symptoms and no urinary symptoms.  ED Course: Home medications continued as per Neurology team (ER Physician reported he communicated with the Neurology team)  Pertinent labs: CT head EKG: Independently reviewed.  Imaging: independently reviewed.   Review of Systems: As in HPI. 12 systems were reviewed. Negative for fever, visual changes, sore throat, rash, new muscle aches, chest pain, SOB, dysuria, bleeding, n/v/abdominal pain.  Past Medical History:  Diagnosis Date  . CHF (congestive heart failure) (HCC)    No recurrent admissions for heart failure.  . Coronary artery disease    Status post coronary bypass grafting in 2007  . Crescendo transient ischemic attacks   . Functional bowel disorder    Bloating and abdominal cramping as well as constipation requiring lubiprostone  . Gastroparesis    Gastric emptying study scheduled  . GERD (gastroesophageal reflux disease)   . Heart attack [facility]) September 13, 2004   Followed by bypass surgery  . History of medication noncompliance   .  Hyperlipidemia   . Hypertension    Poorly controlled  . Ischemic cardiomyopathy    Ejection fraction improved from 15%-20% to 40%-45% in 2012 by echocardiogram  . Moderate mitral regurgitation by prior echocardiogram   . NSTEMI (non-ST elevated myocardial infarction) (HCC) 12/31/2013  . Post-infarction apical thrombus (HCC)    Anticoagulation discontinued  . Stroke [facility]) 04/2012   denies residual on 12/31/2013  . Type 2 diabetes mellitus (HCC)     Past Surgical History:  Procedure Laterality Date  . CORONARY ANGIOPLASTY WITH STENT PLACEMENT  2006  . CORONARY ARTERY BYPASS GRAFT  2007   "CABG X5"  . LEFT HEART CATHETERIZATION WITH CORONARY/GRAFT ANGIOGRAM N/A 01/04/2014   Procedure: LEFT HEART CATHETERIZATION WITH Edwin White;  Surgeon: Kathleene Hazel, MD;  Location[facility] CATH LAB;  Service: Cardiovascular;  Laterality: N/A;  . PERCUTANEOUS CORONARY STENT INTERVENTION (PCI-S)  01/04/2014   Procedure: PERCUTANEOUS CORONARY STENT INTERVENTION (PCI-S);  Surgeon: Kathleene Hazel, MD;  Location: [facility] CATH LAB;  Service: Cardiovascular;;     reports that he has been smoking Cigarettes.  He started smoking about 36 years ago. He has a 36.00 pack-year smoking history. He has never used smokeless tobacco. He reports that he does not drink alcohol or use drugs.  Allergies  Allergen Reactions  . Pravastatin Other (See Comments)    PT CANNOT NOT WALK OR MOVE.    Family History  Problem Relation Age of Onset  . Adopted: Yes  . Coronary artery disease Father   . Cancer Mother      Prior to Admission medications   Medication Sig Start Date End Date Taking?  Authorizing Provider  aspirin EC 81 MG tablet Take 1 tablet (81 mg total) by mouth daily. 07/13/15  Yes Elige RadonJoshua A Dettinger, MD  atorvastatin (LIPITOR) 20 MG tablet Take 1 tablet (20 mg total) by mouth daily. 07/13/15  Yes Elige RadonJoshua A Dettinger, MD  canagliflozin (INVOKANA) 100 MG TABS tablet Take 1 tablet (100 mg total) by  mouth daily before breakfast. 11/04/15  Yes Elige RadonJoshua A Dettinger, MD  carvedilol (COREG) 12.5 MG tablet Take 12.5 mg by mouth 2 (two) times daily with a meal.   Yes Historical Provider, MD  furosemide (LASIX) 80 MG tablet TAKE 1 TABLET BY MOUTH DAILY 01/19/16  Yes Laqueta LindenSuresh A Koneswaran, MD  gabapentin (NEURONTIN) 400 MG capsule Take 1 capsule (400 mg total) by mouth 2 (two) times daily. 07/13/15  Yes Elige RadonJoshua A Dettinger, MD  Insulin Glargine (LANTUS SOLOSTAR) 100 UNIT/ML Solostar Pen Inject 50 Units into the skin daily at 10 pm. 07/13/15  Yes Elige RadonJoshua A Dettinger, MD  JENTADUETO XR 2.10-998 MG TB24 TAKE 1 TABLET BY MOUTH TWICE DAILY 01/19/16  Yes Elige RadonJoshua A Dettinger, MD  lisinopril (PRINIVIL,ZESTRIL) 40 MG tablet Take 1 tablet (40 mg total) by mouth daily. 07/13/15  Yes Elige RadonJoshua A Dettinger, MD  NIFEdipine (PROCARDIA-XL/ADALAT CC) 60 MG 24 hr tablet Take 1 tablet (60 mg total) by mouth daily. 07/13/15  Yes Elige RadonJoshua A Dettinger, MD  potassium chloride SA (K-DUR,KLOR-CON) 20 MEQ tablet TAKE 1 TABLET BY MOUTH DAILY 01/19/16  Yes Laqueta LindenSuresh A Koneswaran, MD  Blood Glucose Monitoring Suppl (ACCU-CHEK AVIVA) device Use as instructed 01/09/16 01/08/17  Elige RadonJoshua A Dettinger, MD  nitroGLYCERIN (NITROSTAT) 0.4 MG SL tablet Place 1 tablet (0.4 mg total) under the tongue every 5 (five) minutes x 3 doses as needed for chest pain. 01/05/14   Allayne ButcherBrittainy M Simmons, PA-C    Physical Exam: Vitals:   02/05/16 1830 02/05/16 1846 02/05/16 1900 02/05/16 1930  BP: 148/93  (!) 160/104 152/96  Pulse:  85 84 83  Resp:  (!) 28 (!) 27 (!) 29  SpO2:  94% 95% 96%     Constitutional:  . Appears calm and comfortable Eyes:  . No pallor. No jaundice.  ENMT:  . external ears, nose appear normal Neck:  . Neck is supple. No JVD Respiratory:  . CTA bilaterally, no w/r/r.  . Respiratory effort normal. No retractions or accessory muscle use Cardiovascular:  . S1S2 . No LE extremity edema   Abdomen:  . Abdomen is soft and non tender. Organs are  difficult to assess. Neurologic:  . Right sided weakness. . Word finding problems and dysarthria  Wt Readings from Last 3 Encounters:  01/09/16 104 kg (229 lb 3.2 oz)  11/04/15 102 kg (224 lb 12.8 oz)  10/04/15 104.8 kg (231 lb)    I have personally reviewed following labs and imaging studies  Labs on Admission:  CBC:  Recent Labs Lab 02/05/16 1715 02/05/16 1723  WBC 7.2  --   NEUTROABS 4.5  --   HGB 16.6 16.3  HCT 46.9 48.0  MCV 87.5  --   PLT 162  --    Basic Metabolic Panel:  Recent Labs Lab 02/05/16 1715 02/05/16 1723  NA 128* 137  K 3.6 3.8  CL 96* 97*  CO2 23  --   GLUCOSE 234* 232*  BUN 15 14  CREATININE 1.19 1.00  CALCIUM 8.4*  --    Liver Function Tests:  Recent Labs Lab 02/05/16 1715  AST 22  ALT 21  ALKPHOS 79  BILITOT  0.6  PROT 7.5  ALBUMIN 4.2   No results for input(s): LIPASE, AMYLASE in the last 168 hours. No results for input(s): AMMONIA in the last 168 hours. Coagulation Profile:  Recent Labs Lab 02/05/16 1715  INR 0.98   Cardiac Enzymes: No results for input(s): CKTOTAL, CKMB, CKMBINDEX, TROPONINI in the last 168 hours. BNP (last 3 results) No results for input(s): PROBNP in the last 8760 hours. HbA1C: No results for input(s): HGBA1C in the last 72 hours. CBG: No results for input(s): GLUCAP in the last 168 hours. Lipid Profile: No results for input(s): CHOL, HDL, LDLCALC, TRIG, CHOLHDL, LDLDIRECT in the last 72 hours. Thyroid Function Tests: No results for input(s): TSH, T4TOTAL, FREET4, T3FREE, THYROIDAB in the last 72 hours. Anemia Panel: No results for input(s): VITAMINB12, FOLATE, FERRITIN, TIBC, IRON, RETICCTPCT in the last 72 hours. Urine analysis: No results found for: COLORURINE, APPEARANCEUR, LABSPEC, PHURINE, GLUCOSEU, HGBUR, BILIRUBINUR, KETONESUR, PROTEINUR, UROBILINOGEN, NITRITE, LEUKOCYTESUR Sepsis Labs: @LABRCNTIP (procalcitonin:4,lacticidven:4) )No results found for this or any previous visit (from the  past 240 hour(s)).    Radiological Exams on Admission: Ct Head Wo Contrast  Result Date: 02/05/2016 CLINICAL DATA:  Patient with right-sided weakness and right-sided facial droop. Code stroke. EXAM: CT HEAD WITHOUT CONTRAST TECHNIQUE: Contiguous axial images were obtained from the base of the skull through the vertex without intravenous contrast. COMPARISON:  Brain MRI 10/01/2015; brain CT 04/08/2015 FINDINGS: Periventricular and subcortical white matter hypodensity compatible with chronic microvascular ischemic changes. Multiple old bilateral basal ganglia lacunar infarcts, left-greater-than-right. Re- demonstrated area of encephalomalacia within the left parietal lobe. More centrally within this region is a new focal area of increased attenuation measuring 11 x 12 mm (image 17; series 2) most compatible with mineralization when compared with prior MRI. No significant mass effect. Orbits are unremarkable. Paranasal sinuses are well aerated. Mastoid air cells unremarkable. Calvarium is intact. IMPRESSION: No acute intracranial process. High attenuation within the chronic left parietal lobe infarct is favored represent mineralization. Critical Value/emergent results were called by telephone at the time of interpretation on 02/05/2016 at 5:52 pm to Dr. Loren Racer , who verbally acknowledged these results. Electronically Signed   By: Annia Belt M.D.   On: 02/05/2016 18:04    EKG: Independently reviewed.   Active Problems:   CVA (cerebral infarction)   Acute CVA (cerebrovascular accident) (HCC)   Assessment/Plan 1. Acute CVA   Admit patient  Continue aspirin  MRI brain in am  MRA brain and MRA Neck in am  ECHO  PT/OT consult  Speech therapy consult  Optimize DM conttrol  DVT prophylaxis: Roberta Lovenox Code Status: Full Family Communication: None available Disposition Plan: To be determined   Consults called: Needs Neurology input in am   Admission status: Inpatient    Time  spent: Greater than 60 minutes  Berton Mount, MD  [facility] Pager #: [phone] 7PM-7AM contact night coverage as above   02/05/2016, 7:41 PM

## 2016-02-05 NOTE — ED Triage Notes (Signed)
Pt brought to er by his neighbor that lives in the same apartment complex.  Neighbor says he saw him today around 10am and he was normal.  Reports a bystander saw him on his porch and had fallen out of his chair.  When friend got to him, pt was incomprehensible and has r sided weakness and slight r sided facial droop.

## 2016-02-05 NOTE — ED Notes (Signed)
Hospitalist at bedside 

## 2016-02-05 NOTE — ED Notes (Signed)
Pt unable to tell nurse if he has history of dysphagia.

## 2016-02-05 NOTE — Progress Notes (Signed)
Code stroke  17 10 beeper Pt on exam table 1730 1737 exam begun 1743 pt off mobile scanner 1744 images to soc 1745 [facility]47 Clint rad answered, spoke wilth Tonia/bbj

## 2016-02-05 NOTE — ED Notes (Addendum)
teleneuro in progress 

## 2016-02-05 NOTE — ED Provider Notes (Signed)
[facility]-EMERGENCY DEPT Provider Note   CSN: [id number] Arrival date & time: 02/05/16  1714  First Provider Contact:  First MD Initiated Contact with Patient 02/05/16 1705        History   Chief Complaint Chief Complaint  Patient presents with  . Code Stroke    HPI Edwin White is a 50 y.o. male.  HPI Patient aphasia and right-sided weakness. Unable to give history. Per neighbor patient was seen at his normal state of health at roughly 10 AM. Low 5 caveat Past Medical History:  Diagnosis Date  . CHF (congestive heart failure) (HCC)    No recurrent admissions for heart failure.  . Coronary artery disease    Status post coronary bypass grafting in 2007  . Crescendo transient ischemic attacks   . Functional bowel disorder    Bloating and abdominal cramping as well as constipation requiring lubiprostone  . Gastroparesis    Gastric emptying study scheduled  . GERD (gastroesophageal reflux disease)   . Heart attack [facility]) September 13, 2004   Followed by bypass surgery  . History of medication noncompliance   . Hyperlipidemia   . Hypertension    Poorly controlled  . Ischemic cardiomyopathy    Ejection fraction improved from 15%-20% to 40%-45% in 2012 by echocardiogram  . Moderate mitral regurgitation by prior echocardiogram   . NSTEMI (non-ST elevated myocardial infarction) (HCC) 12/31/2013  . Post-infarction apical thrombus (HCC)    Anticoagulation discontinued  . Stroke [facility]) 04/2012   denies residual on 12/31/2013  . Type 2 diabetes mellitus [facility])     Patient Active Problem List   Diagnosis Date Noted  . CVA (cerebral infarction) 02/05/2016  . Acute CVA (cerebrovascular accident) (HCC) 02/05/2016  . 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 Diabetes mellitus type 2, insulin dependent (HCC) 07/13/2015  . Hyperlipidemia LDL goal <70 07/13/2015  . CVA (cerebral vascular accident) (HCC) 07/13/2015  . NSTEMI (non-ST elevated  myocardial infarction) (HCC) 12/31/2013  . Hypokalemia 04/21/2012  . Thrombocytopenia (HCC) 04/20/2012  . Gastroparesis   . Ischemic cardiomyopathy   . Coronary artery disease   . Hypertension   . NONDEPENDENT TOBACCO USE DISORDER 05/24/2010  . 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 Chronic systolic heart failure (HCC) 05/24/2010  . PVD WITH CLAUDICATION 05/24/2010  . CHRONIC AIRWAY OBSTRUCTION NEC 05/24/2010  . ESOPHAGEAL REFLUX 05/24/2010  . POSTSURGICAL AORTOCORONARY BYPASS STATUS 05/24/2010    Past Surgical History:  Procedure Laterality Date  . CORONARY ANGIOPLASTY WITH STENT PLACEMENT  2006  . CORONARY ARTERY BYPASS GRAFT  2007   "CABG X5"  . LEFT HEART CATHETERIZATION WITH CORONARY/GRAFT ANGIOGRAM N/A 01/04/2014   Procedure: LEFT HEART CATHETERIZATION WITH Isabel Caprice;  Surgeon: Kathleene Hazel, MD;  Location: [facility] CATH LAB;  Service: Cardiovascular;  Laterality: N/A;  . PERCUTANEOUS CORONARY STENT INTERVENTION (PCI-S)  01/04/2014   Procedure: PERCUTANEOUS CORONARY STENT INTERVENTION (PCI-S);  Surgeon: Kathleene Hazel, MD;  Location: [facility] CATH LAB;  Service: Cardiovascular;;       Home Medications    Prior to Admission medications   Medication Sig Start Date End Date Taking? Authorizing Provider  aspirin EC 81 MG tablet Take 1 tablet (81 mg total) by mouth daily. 07/13/15  Yes Elige Radon Dettinger, MD  atorvastatin (LIPITOR) 20 MG tablet Take 1 tablet (20 mg total) by mouth daily. 07/13/15  Yes Elige Radon Dettinger, MD  canagliflozin (INVOKANA) 100 MG TABS tablet Take 1 tablet (100 mg total)  by mouth daily before breakfast. 11/04/15  Yes Elige Radon Dettinger, MD  carvedilol (COREG) 12.5 MG tablet Take 12.5 mg by mouth 2 (two) times daily with a meal.   Yes Historical Provider, MD  furosemide (LASIX) 80 MG tablet TAKE 1 TABLET BY MOUTH DAILY 01/19/16  Yes Laqueta Linden, MD  gabapentin (NEURONTIN) 400 MG capsule Take 1 capsule (400 mg total) by mouth 2 (two)  times daily. 07/13/15  Yes Elige Radon Dettinger, MD  Insulin Glargine (LANTUS SOLOSTAR) 100 UNIT/ML Solostar Pen Inject 50 Units into the skin daily at 10 pm. 07/13/15  Yes Elige Radon Dettinger, MD  JENTADUETO XR 2.10-998 MG TB24 TAKE 1 TABLET BY MOUTH TWICE DAILY 01/19/16  Yes Elige Radon Dettinger, MD  lisinopril (PRINIVIL,ZESTRIL) 40 MG tablet Take 1 tablet (40 mg total) by mouth daily. 07/13/15  Yes Elige Radon Dettinger, MD  NIFEdipine (PROCARDIA-XL/ADALAT CC) 60 MG 24 hr tablet Take 1 tablet (60 mg total) by mouth daily. 07/13/15  Yes Elige Radon Dettinger, MD  nitroGLYCERIN (NITROSTAT) 0.4 MG SL tablet Place 1 tablet (0.4 mg total) under the tongue every 5 (five) minutes x 3 doses as needed for chest pain. 01/05/14  Yes Brittainy Sherlynn Carbon, PA-C  potassium chloride SA (K-DUR,KLOR-CON) 20 MEQ tablet TAKE 1 TABLET BY MOUTH DAILY 01/19/16  Yes Laqueta Linden, MD  Blood Glucose Monitoring Suppl (ACCU-CHEK AVIVA) device Use as instructed 01/09/16 01/08/17  Elige Radon Dettinger, MD    Family History Family History  Problem Relation Age of Onset  . Adopted: Yes  . Coronary artery disease Father   . Cancer Mother     Social History Social History  Substance Use Topics  . Smoking status: Current Every Day Smoker    Packs/day: 1.00    Years: 36.00    Types: Cigarettes    Start date: 05/05/1979  . Smokeless tobacco: Never Used  . Alcohol use No     Comment: "quit drinking alcohol in 2006"     Allergies   Pravastatin   Review of Systems Review of Systems  Unable to perform ROS: Patient nonverbal     Physical Exam Updated Vital Signs BP 116/83 (BP Location: Left Arm)   Pulse 85   Temp 98.4 F (36.9 C) (Oral)   Resp 20   Ht 6' (1.829 m)   Wt 227 lb 8.2 oz (103.2 kg)   SpO2 96%   BMI 30.86 kg/m   Physical Exam  Constitutional: He appears well-developed and well-nourished.  HENT:  Head: Normocephalic and atraumatic.  Mouth/Throat: Oropharynx is clear and moist.  Eyes: EOM are normal.  Pupils are equal, round, and reactive to light.  Neck: Normal range of motion. Neck supple. No JVD present.  Cardiovascular: Normal rate and regular rhythm.   Pulmonary/Chest: Effort normal and breath sounds normal.  Abdominal: Soft. Bowel sounds are normal. There is no tenderness. There is no rebound and no guarding.  Musculoskeletal: Normal range of motion. He exhibits no edema or tenderness.  Neurological: He is alert.  Patient with expressive aphasia. Following commands. Appears to have slight right lower facial droop. 3/5 grip strength in the right hand compared to left 5/5. 4/5 motor in the right lower extremity compared to left 5/5. Sensation to light touch is grossly intact.  Skin: Skin is warm and dry. No rash noted. No erythema.  Psychiatric: He has a normal mood and affect. His behavior is normal.  Nursing note and vitals reviewed.    ED Treatments / Results  Labs (all labs ordered are listed, but only abnormal results are displayed) Labs Reviewed  COMPREHENSIVE METABOLIC PANEL - Abnormal; Notable for the following:       Result Value   Sodium 128 (*)    Chloride 96 (*)    Glucose, Bld 234 (*)    Calcium 8.4 (*)    All other components within normal limits  URINALYSIS, ROUTINE W REFLEX MICROSCOPIC (NOT AT [facility]) - Abnormal; Notable for the following:    Glucose, UA 250 (*)    Protein, ur 30 (*)    All other components within normal limits  HEMOGLOBIN A1C - Abnormal; Notable for the following:    Hgb A1c MFr Bld 8.6 (*)    All other components within normal limits  LIPID PANEL - Abnormal; Notable for the following:    Cholesterol 222 (*)    Triglycerides 200 (*)    HDL 33 (*)    LDL Cholesterol 149 (*)    All other components within normal limits  GLUCOSE, CAPILLARY - Abnormal; Notable for the following:    Glucose-Capillary 248 (*)    All other components within normal limits  GLUCOSE, CAPILLARY - Abnormal; Notable for the following:    Glucose-Capillary 193 (*)      All other components within normal limits  GLUCOSE, CAPILLARY - Abnormal; Notable for the following:    Glucose-Capillary 171 (*)    All other components within normal limits  GLUCOSE, CAPILLARY - Abnormal; Notable for the following:    Glucose-Capillary 266 (*)    All other components within normal limits  GLUCOSE, CAPILLARY - Abnormal; Notable for the following:    Glucose-Capillary 149 (*)    All other components within normal limits  GLUCOSE, CAPILLARY - Abnormal; Notable for the following:    Glucose-Capillary 175 (*)    All other components within normal limits  GLUCOSE, CAPILLARY - Abnormal; Notable for the following:    Glucose-Capillary 163 (*)    All other components within normal limits  GLUCOSE, CAPILLARY - Abnormal; Notable for the following:    Glucose-Capillary 133 (*)    All other components within normal limits  I-STAT CHEM 8, ED - Abnormal; Notable for the following:    Chloride 97 (*)    Glucose, Bld 232 (*)    Calcium, Ion 1.10 (*)    All other components within normal limits  ETHANOL  PROTIME-INR  APTT  CBC  DIFFERENTIAL  URINE RAPID DRUG SCREEN, HOSP PERFORMED  URINE MICROSCOPIC-ADD ON  Rosezena Sensor, ED    EKG  EKG Interpretation  Date/Time:  Sunday February 05 2016 17:16:16 EDT Ventricular Rate:  98 PR Interval:    QRS Duration: 103 QT Interval:  390 QTC Calculation: 498 R Axis:   -64 Text Interpretation:  Sinus rhythm Left anterior fascicular block LVH with secondary repolarization abnormality Anterior Q waves, possibly due to LVH Confirmed by RAY MD, Duwayne Heck [phone]) on 02/06/2016 5:05:57 PM       Radiology Mr Maxine Glenn Head Wo Contrast  Result Date: 02/06/2016 CLINICAL DATA:  Right-sided weakness and facial droop. EXAM: MRI HEAD WITHOUT CONTRAST MRA HEAD WITHOUT CONTRAST TECHNIQUE: Multiplanar, multiecho pulse sequences of the brain and surrounding structures were obtained without intravenous contrast. Angiographic images of the head were  obtained using MRA technique without contrast. COMPARISON:  10/01/2015 FINDINGS: Intermittently motion degraded MRI HEAD FINDINGS Calvarium and upper cervical spine: No focal marrow signal abnormality. Orbits: Negative. Sinuses and Mastoids: Chronic bilateral mastoid fluid with negative nasopharynx. Brain: Moderate area  of cortically based restricted diffusion in the left posterior temporal and parietal cortex. This is in the MCA distribution. The acute infarct neighbors a remote infarct more posteriorly, which shows intrinsic T1 hyperintensity from the mineralization. Accounting for this there is no hemorrhagic conversion of the acute infarct. There is chronic white matter disease. Remote perforator infarct in the left basal ganglia affecting caudate, putamen, and internal capsule. Remote lacunar infarcts in the bilateral thalamus. There is associated wallerian degeneration seen in the left cerebral peduncle. The brainstem is atrophic. MRA HEAD FINDINGS Symmetric carotid arteries. Atheromatous type irregularity of the bilateral carotid siphons without flow limiting stenosis. After the anterior temporal branch, there is chronic right M1 occlusion. Associated collaterals from the anterior cerebral territory seen over the right frontal convexity. Collateral flow seen within the distal right MCA branches at the upper sylvian fissure. Atheromatous narrowing seen in the bilateral ACA and left MCA distribution, including chronic high-grade stenosis of a left M2 insular branch. No progression compared to prior. Mild left vertebral artery dominance. Inferior cerebellar arteries are not well visualized. Poor flow in left PCA branches beyond the P3 segment, stable. Mild right P2 segment narrowing with improved MR appearance. IMPRESSION: 1. Moderate-sized left MCA territory acute infarct affecting the posterior temporal and parietal cortex. This neighbors a remote left parietal infarct. No hemorrhagic conversion. 2. Remote  left perforator infarcts with left corticospinal wallerian degeneration. 3. No acute arterial finding. 4. 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 5. Diffuse intracranial atherosclerosis. No evidence of progressive stenosis compared to 10/01/2015. Electronically Signed   By: Marnee Spring M.D.   On: 02/06/2016 10:14   Mr Brain Wo Contrast  Result Date: 02/06/2016 CLINICAL DATA:  Right-sided weakness and facial droop. EXAM: MRI HEAD WITHOUT CONTRAST MRA HEAD WITHOUT CONTRAST TECHNIQUE: Multiplanar, multiecho pulse sequences of the brain and surrounding structures were obtained without intravenous contrast. Angiographic images of the head were obtained using MRA technique without contrast. COMPARISON:  10/01/2015 FINDINGS: Intermittently motion degraded MRI HEAD FINDINGS Calvarium and upper cervical spine: No focal marrow signal abnormality. Orbits: Negative. Sinuses and Mastoids: Chronic bilateral mastoid fluid with negative nasopharynx. Brain: Moderate area of cortically based restricted diffusion in the left posterior temporal and parietal cortex. This is in the MCA distribution. The acute infarct neighbors a remote infarct more posteriorly, which shows intrinsic T1 hyperintensity from the mineralization. Accounting for this there is no hemorrhagic conversion of the acute infarct. There is chronic white matter disease. Remote perforator infarct in the left basal ganglia affecting caudate, putamen, and internal capsule. Remote lacunar infarcts in the bilateral thalamus. There is associated wallerian degeneration seen in the left cerebral peduncle. The brainstem is atrophic. MRA HEAD FINDINGS Symmetric carotid arteries. Atheromatous type irregularity of the bilateral carotid siphons without flow limiting stenosis. After the anterior temporal branch, there is chronic right M1 occlusion. Associated collaterals from the anterior cerebral territory seen over the right frontal convexity.  Collateral flow seen within the distal right MCA branches at the upper sylvian fissure. Atheromatous narrowing seen in the bilateral ACA and left MCA distribution, including chronic high-grade stenosis of a left M2 insular branch. No progression compared to prior. Mild left vertebral artery dominance. Inferior cerebellar arteries are not well visualized. Poor flow in left PCA branches beyond the P3 segment, stable. Mild right P2 segment narrowing with improved MR appearance. IMPRESSION: 1. Moderate-sized left MCA territory acute infarct affecting the posterior temporal and parietal cortex. This neighbors a remote left parietal infarct. No hemorrhagic conversion.  2. Remote left perforator infarcts with left corticospinal wallerian degeneration. 3. No acute arterial finding. 4. 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 5. Diffuse intracranial atherosclerosis. No evidence of progressive stenosis compared to 10/01/2015. Electronically Signed   By: Marnee SpringJonathon  Watts M.D.   On: 02/06/2016 10:14    Procedures Procedures (including critical care time)  Medications Ordered in ED Medications  furosemide (LASIX) tablet 80 mg (80 mg Oral Given 02/07/16 0926)  potassium chloride SA (K-DUR,KLOR-CON) CR tablet 20 mEq (20 mEq Oral Given 02/07/16 0925)  canagliflozin (INVOKANA) tablet 100 mg (100 mg Oral Given 02/07/16 0924)  aspirin EC tablet 81 mg (81 mg Oral Given 02/07/16 0925)  atorvastatin (LIPITOR) tablet 20 mg (20 mg Oral Given 02/07/16 1800)  carvedilol (COREG) tablet 12.5 mg (12.5 mg Oral Not Given 02/07/16 1700)  gabapentin (NEURONTIN) capsule 400 mg (400 mg Oral Given 02/07/16 0924)  insulin glargine (LANTUS) injection 50 Units (50 Units Subcutaneous Given 02/06/16 2304)  lisinopril (PRINIVIL,ZESTRIL) tablet 40 mg (40 mg Oral Given 02/07/16 0924)  nitroGLYCERIN (NITROSTAT) SL tablet 0.4 mg (not administered)   stroke: mapping our early stages of recovery book ( Does not apply Not Given 02/05/16 2032)    senna-docusate (Senokot-S) tablet 1 tablet (not administered)  insulin aspart (novoLOG) injection 0-9 Units (1 Units Subcutaneous Given 02/07/16 1746)  insulin aspart (novoLOG) injection 0-5 Units (0 Units Subcutaneous Not Given 02/06/16 2200)  linagliptin (TRADJENTA) tablet 2.5 mg (2.5 mg Oral Given 02/07/16 1700)    And  metFORMIN (GLUCOPHAGE-XR) 24 hr tablet 1,000 mg (1,000 mg Oral Given 02/07/16 1746)  perflutren lipid microspheres (DEFINITY) IV suspension (not administered)   stroke: mapping our early stages of recovery book ( Does not apply Given 02/06/16 1015)     Initial Impression / Assessment and Plan / ED Course  I have reviewed the triage vital signs and the nursing notes.  Pertinent labs & imaging results that were available during my care of the patient were reviewed by me and considered in my medical decision making (see chart for details).  Clinical Course    Code stroke called though given the uncertainty of the time of onset, doubt patient is a tPA candidate.   Discussed with neurology. Not a TPA candidate. We will admit to telemetry bed. Final Clinical Impressions(s) / ED Diagnoses   Final diagnoses:  Acute ischemic stroke [facility](HCC)    New Prescriptions Current Discharge Medication List       Loren Raceravid Nimisha Rathel, MD 02/07/16 2055

## 2016-02-06 ENCOUNTER — Inpatient Hospital Stay (HOSPITAL_COMMUNITY): Payer: Medicaid Other

## 2016-02-06 DIAGNOSIS — I6789 Other cerebrovascular disease: Secondary | ICD-10-CM

## 2016-02-06 DIAGNOSIS — I5022 Chronic systolic (congestive) heart failure: Secondary | ICD-10-CM

## 2016-02-06 DIAGNOSIS — I639 Cerebral infarction, unspecified: Secondary | ICD-10-CM

## 2016-02-06 DIAGNOSIS — I10 Essential (primary) hypertension: Secondary | ICD-10-CM

## 2016-02-06 DIAGNOSIS — I63412 Cerebral infarction due to embolism of left middle cerebral artery: Principal | ICD-10-CM

## 2016-02-06 DIAGNOSIS — I255 Ischemic cardiomyopathy: Secondary | ICD-10-CM

## 2016-02-06 LAB — URINALYSIS, ROUTINE W REFLEX MICROSCOPIC
Bilirubin Urine: NEGATIVE
Glucose, UA: 250 mg/dL — AB
Hgb urine dipstick: NEGATIVE
Ketones, ur: NEGATIVE mg/dL
Leukocytes, UA: NEGATIVE
Nitrite: NEGATIVE
Protein, ur: 30 mg/dL — AB
Specific Gravity, Urine: 1.02 (ref 1.005–1.030)
pH: 6 (ref 5.0–8.0)

## 2016-02-06 LAB — ECHOCARDIOGRAM COMPLETE
E decel time: 201 msec
E/e' ratio: 14.96
FS: 19 % — AB (ref 28–44)
Height: 72 in
IVS/LV PW RATIO, ED: 0.83
LA ID, A-P, ES: 45 mm
LA diam end sys: 45 mm
LA vol A4C: 51.7 ml
LV E/e' medial: 14.96
LV E/e'average: 14.96
LV PW d: 17.5 mm — AB (ref 0.6–1.1)
LV e' LATERAL: 5.22 cm/s
LVOT area: 3.46 cm2
LVOT diameter: 21 mm
MV Dec: 201
MV Peak grad: 2 mmHg
MV pk A vel: 84.4 m/s
MV pk E vel: 78.1 m/s
TAPSE: 12.8 mm
TDI e' lateral: 5.22
TDI e' medial: 2.94
Weight: 3670.4 oz

## 2016-02-06 LAB — URINE MICROSCOPIC-ADD ON
Bacteria, UA: NONE SEEN
Squamous Epithelial / LPF: NONE SEEN
WBC UA: NONE SEEN WBC/hpf (ref 0–5)

## 2016-02-06 LAB — RAPID URINE DRUG SCREEN, HOSP PERFORMED
Amphetamines: NOT DETECTED
Barbiturates: NOT DETECTED
Benzodiazepines: NOT DETECTED
Cocaine: NOT DETECTED
Opiates: NOT DETECTED
Tetrahydrocannabinol: NOT DETECTED

## 2016-02-06 LAB — GLUCOSE, CAPILLARY
Glucose-Capillary: 193 mg/dL — ABNORMAL HIGH (ref 65–99)
Glucose-Capillary: 171 mg/dL — ABNORMAL HIGH (ref 65–99)
Glucose-Capillary: 266 mg/dL — ABNORMAL HIGH (ref 65–99)
Glucose-Capillary: 149 mg/dL — ABNORMAL HIGH (ref 65–99)

## 2016-02-06 LAB — LIPID PANEL
Cholesterol: 222 mg/dL — ABNORMAL HIGH (ref 0–200)
HDL: 33 mg/dL — ABNORMAL LOW (ref >40)
LDL Cholesterol: 149 mg/dL — ABNORMAL HIGH (ref 0–99)
Total CHOL/HDL Ratio: 6.7 RATIO
Triglycerides: 200 mg/dL — ABNORMAL HIGH (ref <150)
VLDL: 40 mg/dL (ref 0–40)

## 2016-02-06 MED ORDER — PERFLUTREN LIPID MICROSPHERE: 1.0000 mL | INTRAVENOUS | Status: AC | PRN

## 2016-02-06 MED ORDER — STROKE: EARLY STAGES OF RECOVERY BOOK
Freq: Once | Status: AC
  Administered 2016-02-06: 10:00:00
  Filled 2016-02-06: qty 1

## 2016-02-06 MED ORDER — CLOPIDOGREL BISULFATE 75 MG PO TABS
75.0000 mg | ORAL_TABLET | Freq: Every day | ORAL | Status: DC
  Administered 2016-02-06: 75 mg via ORAL
  Filled 2016-02-06: qty 1

## 2016-02-06 NOTE — Evaluation (Signed)
Speech Language Pathology Evaluation Patient Details Name: Edwin White MRN: [id number] DOB: 1966/05/21 Today's Date: 02/06/2016 Time: [id number] SLP Time Calculation (min) (ACUTE ONLY): 21 min  Problem List:  Patient Active Problem List   Diagnosis Date Noted  . CVA (cerebral infarction) 02/05/2016  . Acute CVA (cerebrovascular accident) (HCC) 02/05/2016  . 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 Diabetes mellitus type 2, insulin dependent (HCC) 07/13/2015  . Hyperlipidemia LDL goal <70 07/13/2015  . CVA (cerebral vascular accident) (HCC) 07/13/2015  . NSTEMI (non-ST elevated myocardial infarction) (HCC) 12/31/2013  . Hypokalemia 04/21/2012  . Thrombocytopenia (HCC) 04/20/2012  . Gastroparesis   . Ischemic cardiomyopathy   . Coronary artery disease   . Hypertension   . NONDEPENDENT TOBACCO USE DISORDER 05/24/2010  . 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 Chronic systolic heart failure (HCC) 05/24/2010  . PVD WITH CLAUDICATION 05/24/2010  . CHRONIC AIRWAY OBSTRUCTION NEC 05/24/2010  . ESOPHAGEAL REFLUX 05/24/2010  . POSTSURGICAL AORTOCORONARY BYPASS STATUS 05/24/2010   Past Medical History:  Past Medical History:  Diagnosis Date  . CHF (congestive heart failure) (HCC)    No recurrent admissions for heart failure.  . Coronary artery disease    Status post coronary bypass grafting in 2007  . Crescendo transient ischemic attacks   . Functional bowel disorder    Bloating and abdominal cramping as well as constipation requiring lubiprostone  . Gastroparesis    Gastric emptying study scheduled  . GERD (gastroesophageal reflux disease)   . Heart attack [facility](HCC) September 13, 2004   Followed by bypass surgery  . History of medication noncompliance   . Hyperlipidemia   . Hypertension    Poorly controlled  . Ischemic cardiomyopathy    Ejection fraction improved from 15%-20% to 40%-45% in 2012 by echocardiogram  . Moderate mitral  regurgitation by prior echocardiogram   . NSTEMI (non-ST elevated myocardial infarction) (HCC) 12/31/2013  . Post-infarction apical thrombus (HCC)    Anticoagulation discontinued  . Stroke [facility](HCC) 04/2012   denies residual on 12/31/2013  . Type 2 diabetes mellitus (HCC)    Past Surgical History:  Past Surgical History:  Procedure Laterality Date  . CORONARY ANGIOPLASTY WITH STENT PLACEMENT  2006  . CORONARY ARTERY BYPASS GRAFT  2007   "CABG X5"  . LEFT HEART CATHETERIZATION WITH CORONARY/GRAFT ANGIOGRAM N/A 01/04/2014   Procedure: LEFT HEART CATHETERIZATION WITH Isabel CapriceORONARY/GRAFT ANGIOGRAM;  Surgeon: Kathleene Hazelhristopher D McAlhany, MD;  [facility];  Service: Cardiovascular;  Laterality: N/A;  . PERCUTANEOUS CORONARY STENT INTERVENTION (PCI-S)  01/04/2014   Procedure: PERCUTANEOUS CORONARY STENT INTERVENTION (PCI-S);  Surgeon: Kathleene Hazelhristopher D McAlhany, MD;  [facility];  Service: Cardiovascular;;   HPI:  Pt is a 50 year old male with history of DM, stroke, NSTEMI, post infarct apical thrombus, ischemic cardiomyopathy, non compliance and gastroparesis. Patient has speech problems and could not or would not give much history. There is likely component of word finding problems. No collateral informant available. Patient presents to the ER with right sided weakness and speech difficulties. MRI of head revealed Moderate-sized left MCA territory acute infarct affecting the posterior temporal and parietal cortex. This neighbors a remote left parietal infarct.    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 Pt with minimal verbal responses despite alternative modalities attempted (singing, visual cues, phonemic and semantic cues). Pt  with significant impairments in graphic expression, reading, and speed of information processing.   Simple yes/no questions appeared intact however pt attested to difficulty understanding complex verbal information. Pts friend present to obtain partial PLOF information: pt lived alone at an apartment, friend not aware of family available to help at DC, pt previously drove and was responsible for all complex ADLs including medicine and finance management. Pts severe speech and language deficits prohibit administration of standardized cognitive assessment. Suspect acute cognitive deficits through clinical interactions: poor problem solving observed in addition to reduced safety awarness. Pt displayed reduced RUE coordination and strength, states previuously right handed, now using left hand during self feeding and functional taks. Recommend SNF placment or strict 24 hour care if familiy available to assist with needs. ST to follow up during acute stay for cognitive linguistic and dysphagia intervention.     SLP Assessment  Patient needs continued Speech Lanaguage Pathology Services    Follow Up Recommendations  Skilled Nursing facility;24 hour supervision/assistance    Frequency and Duration min 2x/week  2 weeks      SLP Evaluation Prior Functioning  Cognitive/Linguistic Baseline: Information not available Type of Home: Apartment  Lives With: Alone Available Help at Discharge: Available PRN/intermittently;Friend(s) Vocation: Unemployed   Cognition  Overall Cognitive Status: Difficult to assess (suspect cognitive deficits ) Arousal/Alertness: Awake/alert Orientation Level: Other (comment) (expressive aphasia prohibits verbal response ) Awareness: Appears intact Problem Solving: Impaired Problem Solving Impairment: Verbal complex;Functional complex Behaviors: Lability Safety/Judgment: Impaired    Comprehension  Auditory Comprehension Overall Auditory Comprehension: Impaired Yes/No Questions: Within Functional Limits Commands: Within Functional Limits Conversation:  Complex Interfering Components: Processing speed EffectiveTechniques: Repetition;Slowed speech;Extra processing time;Increased volume;Visual/Gestural cues;Stressing words;Pausing Reading Comprehension Reading Status: Impaired Word level: Impaired Sentence Level: Impaired Paragraph Level: Impaired Functional Environmental (signs, name badge): Impaired Interfering Components: Processing time    Expression Expression Primary Mode of Expression: Nonverbal - gestures Verbal Expression Overall Verbal Expression: Impaired Initiation: Impaired Repetition: Impaired Level of Impairment: Word level;Phrase level;Sentence level Naming: Impairment Responsive: 0-25% accurate Confrontation: Impaired Convergent: 0-24% accurate Divergent: 0-24% accurate Verbal Errors: Phonemic paraphasias;Aware of errors Written Expression Dominant Hand: Right Written Expression: Exceptions to [facility]   Oral / Motor  Oral Motor/Sensory Function Overall Oral Motor/Sensory Function: Within functional limits Motor Speech Overall Motor Speech: Appears within functional limits for tasks assessed Respiration: Within functional limits   GO                   Marcene Duos MA, CCC-SLP Acute Care Speech Language Pathologist    Edwin White 02/06/2016, 3:16 PM

## 2016-02-06 NOTE — Evaluation (Signed)
l Clinical/Bedside Swallow Evaluation Patient Details  Name: Edwin White MRN: [id number] Date of Birth: [birth year]  Today's Date: 02/06/2016 Time: SLP Start Time (ACUTE ONLY): 1335 SLP Stop Time (ACUTE ONLY): 1356 SLP Time Calculation (min) (ACUTE ONLY): 21 min  Past Medical History:  Past Medical History:  Diagnosis Date  . CHF (congestive heart failure) (HCC)    No recurrent admissions for heart failure.  . Coronary artery disease    Status post coronary bypass grafting in 2007  . Crescendo transient ischemic attacks   . Functional bowel disorder    Bloating and abdominal cramping as well as constipation requiring lubiprostone  . Gastroparesis    Gastric emptying study scheduled  . GERD (gastroesophageal reflux disease)   . Heart attack [facility](HCC) September 13, 2004   Followed by bypass surgery  . History of medication noncompliance   . Hyperlipidemia   . Hypertension    Poorly controlled  . Ischemic cardiomyopathy    Ejection fraction improved from 15%-20% to 40%-45% in 2012 by echocardiogram  . Moderate mitral regurgitation by prior echocardiogram   . NSTEMI (non-ST elevated myocardial infarction) (HCC) 12/31/2013  . Post-infarction apical thrombus (HCC)    Anticoagulation discontinued  . Stroke [facility](HCC) 04/2012   denies residual on 12/31/2013  . Type 2 diabetes mellitus (HCC)    Past Surgical History:  Past Surgical History:  Procedure Laterality Date  . CORONARY ANGIOPLASTY WITH STENT PLACEMENT  2006  . CORONARY ARTERY BYPASS GRAFT  2007   "CABG X5"  . LEFT HEART CATHETERIZATION WITH CORONARY/GRAFT ANGIOGRAM N/A 01/04/2014   Procedure: LEFT HEART CATHETERIZATION WITH Isabel CapriceORONARY/GRAFT ANGIOGRAM;  Surgeon: Kathleene Hazelhristopher D McAlhany, MD;  [facility];  Service: Cardiovascular;  Laterality: N/A;  . PERCUTANEOUS CORONARY STENT INTERVENTION (PCI-S)  01/04/2014   Procedure: PERCUTANEOUS CORONARY STENT INTERVENTION (PCI-S);  Surgeon: Kathleene Hazelhristopher D McAlhany, MD;  [facility];   Service: Cardiovascular;;   HPI:  Pt is a 50 year old male with history of DM, stroke, NSTEMI, post infarct apical thrombus, ischemic cardiomyopathy, non compliance and gastroparesis. Patient has speech problems and could not or would not give much history. There is likely component of word finding problems. No collateral informant available. Patient presents to the ER with right sided weakness and speech difficulties. MRI of head revealed Moderate-sized left MCA territory acute infarct affecting the posterior temporal and parietal cortex. This neighbors a remote left parietal infarct.    Assessment / Plan / Recommendation Clinical Impression  Pt presents with mild oropharyneal dysphagia with episodic aspiration instances felt to be impacted greatly by impulsivity in rate of consumption.  Pt with one episode of overt coughing following a mixed consistency PO trial however reflexive cough noted to be strong. When pt implemented smaller bites and sips no overt signs or symptoms of aspiration were evidenced. Pt with mildly prolonged mastication of solid PO felt to be secondary to edentulous oral cavity rather than oral motor weakness. Oral motor exam unremarkable. Recommend dysphagia 3 (mechanical soft) and thin liquid diet initiation with medicines whole in puree and full supervision to reinforce safe swallow strategies.  Recommend no straws. ST to follow up for diet tolerance and cognitive linguistic evaluation.      Aspiration Risk  Mild aspiration risk    Diet Recommendation     Medication Administration: Whole meds with puree    Other  Recommendations Oral Care Recommendations: Oral care BID   Follow up Recommendations  24 hour supervision/assistance    Frequency and Duration  min 2x/week  1 week       Prognosis Prognosis for Safe Diet Advancement: Good Barriers to Reach Goals: Cognitive deficits;Language deficits      Swallow Study   General Date of Onset: 02/05/16 HPI: Pt is a 50  year old male with history of DM, stroke, NSTEMI, post infarct apical thrombus, ischemic cardiomyopathy, non compliance and gastroparesis. Patient has speech problems and could not or would not give much history. There is likely component of word finding problems. No collateral informant available. Patient presents to the ER with right sided weakness and speech difficulties. MRI of head revealed Moderate-sized left MCA territory acute infarct affecting the posterior temporal and parietal cortex. This neighbors a remote left parietal infarct.  Type of Study: Bedside Swallow Evaluation Previous Swallow Assessment: none on file Diet Prior to this Study: NPO Temperature Spikes Noted: No Respiratory Status: Room air History of Recent Intubation: No Behavior/Cognition: Impulsive;Alert Oral Cavity Assessment: Dry Oral Cavity - Dentition: Edentulous Vision: Functional for self-feeding Self-Feeding Abilities: Able to feed self Patient Positioning: Upright in bed Baseline Vocal Quality: Normal Volitional Cough: Strong Volitional Swallow: Able to elicit    Oral/Motor/Sensory Function Overall Oral Motor/Sensory Function: Within functional limits   Ice Chips Ice chips: Within functional limits Presentation: Cup   Thin Liquid Thin Liquid: Impaired Presentation: Straw;Cup Pharyngeal  Phase Impairments: Suspected delayed Swallow;Cough - Immediate;Multiple swallows    Nectar Thick Nectar Thick Liquid: Not tested   Honey Thick Honey Thick Liquid: Not tested   Puree Puree: Within functional limits   Solid   GO   Solid: Impaired Presentation: Self Fed Oral Phase Impairments: Impaired mastication Oral Phase Functional Implications: Prolonged oral transit Pharyngeal Phase Impairments: Suspected delayed Swallow;Multiple swallows       Marcene Duos MA, CCC-SLP Acute Care Speech Language Pathologist    Kennieth Rad 02/06/2016,2:07 PM

## 2016-02-06 NOTE — Evaluation (Signed)
Occupational Therapy Evaluation Patient Details Name: Edwin White MRN: [id number] DOB: 1966/02/19 Today's Date: 02/06/2016    History of Present Illness 50 year old male with history of DM, stroke, NSTEMI, post infarct apical thrombus, ischemic cardiomyopathy, non compliance and gastroparesis. Patient has speech problems and could not or would not give much history. There is likely component of word finding problems. No collateral informant available. Patient presents to the ER with right sided weakness and speech difficulties. Time of onset of symptoms is unclear. ER physician has discussed with the Neurologist on call and it has been decided that the patient will be admitted to this hospital. CT head result is not clear. No headache, no neck pain, no SOB, no fever or chills, no GI symptoms and no urinary symptoms.   Clinical Impression   Pt seated in recliner upon therapy arrival and agreeable to participate in OT eval. Patient currently has expressive communication difficulties. No family or friends present to determine if this is baseline. Patient states that he has no difficulties with communication. Patient does best with yes/no questions. No deficits noted related to OT during evaluation. Patient has WNL strength in BUE and is independent with BADL tasks.     Follow Up Recommendations  No OT follow up    Equipment Recommendations  None recommended by OT       Precautions / Restrictions Precautions Precautions: None Restrictions Weight Bearing Restrictions: No      Mobility                   Transfers Overall transfer level: Independent Equipment used: None                       ADL Overall ADL's : Independent;At baseline                                                       Pertinent Vitals/Pain Pain Assessment: No/denies pain     Hand Dominance Right   Extremity/Trunk Assessment Upper Extremity Assessment Upper  Extremity Assessment: Overall WFL for tasks assessed   Lower Extremity Assessment Lower Extremity Assessment: Defer to PT evaluation       Communication Communication Communication: Expressive difficulties   Cognition Arousal/Alertness: Awake/alert Behavior During Therapy: WFL for tasks assessed/performed Overall Cognitive Status: Difficult to assess                                Home Living Family/patient expects to be discharged to:: Private residence Living Arrangements: Alone   Type of Home: Apartment Home Access: Level entry           Bathroom Shower/Tub: Tub/shower unit         Home Equipment: None   Additional Comments: Pt reports that he is currently driving.      Prior Functioning/Environment Level of Independence: Independent  Gait / Transfers Assistance Needed: Pt reports no DME use to ambulate prior.  ADL's / Homemaking Assistance Needed: Independent                                  End of Session    Activity Tolerance: Patient tolerated treatment well Patient left: Other (comment) (  In wheelchair being transported to MRI)   Time: [id number] OT Time Calculation (min): 13 min Charges:  OT General Charges $OT Visit: 1 Procedure OT Evaluation $OT Eval Low Complexity: 1 Procedure G-Codes:    02/06/2016, 9:31 AM Limmie PatriciaLaura Liviana Mills, OTR/L,CBIS  [phone]

## 2016-02-06 NOTE — Care Management Note (Signed)
Case Management Note  Patient Details  Name: Leonides CaveDavid W Gibbs MRN: [id number] Date of Birth: February 29, 1966  Subjective/Objective:     Patient is from home, ind with ADL's. MRI + for infarct. Patient has expressive aphasia, no follow up recommendation for PT/OT, SLP recommendation DYS 3 diet and 24 hour supervision.                Action/Plan: Pat Patrickalled Opal, patient's sister, who states patient has been very independent and does not let family help him. She is calling the other sister, Corrie DandyMary and will get back with me. However she did state that they would not be able to provide 24 hour supervision and patient has refused HH in the past.   Expected Discharge Date:                  Expected Discharge Plan:  Home w Home Health Services  In-House Referral:  NA  Discharge planning Services  CM Consult  Post Acute Care Choice:    Choice offered to:  Patient  DME Arranged:    DME Agency:     HH Arranged:    HH Agency:     Status of Service:  In process, will continue to follow  If discussed at Long Length of Stay Meetings, dates discussed:    Additional Comments:  Aster Eckrich, Chrystine OilerSharley Diane, RN 02/06/2016, 2:36 PM

## 2016-02-06 NOTE — Progress Notes (Signed)
PROGRESS NOTE  Edwin White  ZOX:[id number] DOB: Jul 30, 1965 DOA: 02/05/2016 PCP: Elige Radon Dettinger, MD Outpatient Specialists:  Subjective: Patient is unable to provide meaningful conversation secondary to expressive aphasia  Brief Narrative:  50 year old male with history of DM, stroke, NSTEMI, post infarct apical thrombus, ischemic cardiomyopathy, non compliance and gastroparesis. Patient has speech problems and could not or would not give much history. There is likely component of word finding problems. No collateral informant available. Patient presents to the ER with right sided weakness and speech difficulties. Time of onset of symptoms is unclear. ER physician has discussed with the Neurologist on call and it has been decided that the patient will be admitted to this hospital. CT head result is not clear. No headache, no neck pain, no SOB, no fever or chills, no GI symptoms and no urinary symptoms.  Assessment & Plan:   Principal Problem:   Acute CVA (cerebrovascular accident) (HCC) Active Problems:   Chronic systolic heart failure (HCC)   Ischemic cardiomyopathy   Hypertension   CVA (cerebral infarction)   Acute CVA -Presented with expressive aphasia, minimal right-sided weakness., MRI showed acute infarct involving the left MCA territory. -He also have remote left parietal infarct close to this new infarct. Chronic M1 occlusion. -Patient is on aspirin, technically this is aspirin failure. -Continue stroke workup, start Plavix, 2-D echo pending, neurology to evaluate.  Chronic systolic CHF -Patient is euvolemic no evidence of decompensation, last 2-D echo was in 2013 with LVEF of 25-30% with diffuse hypokinesis. -2-D echo pending.  Essential hypertension -We'll allow permissive blood pressure.  History of stroke -Has chronic M1 occlusion from previous stroke.  DVT prophylaxis: SQ heparin Code Status: Full Code Family Communication:  Disposition Plan:  Diet: Diet  NPO time specified  Consultants:   Neuro  Procedures:   None  Antimicrobials:   None  Objective: Vitals:   02/06/16 0436 02/06/16 0800 02/06/16 1000 02/06/16 1200  BP: (!) 142/86 132/86 (!) 143/98 (!) 148/93  Pulse: 82 77 77 88  Resp: 20 20 20 20   Temp: 98.7 F (37.1 C) 98.2 F (36.8 C) 97.8 F (36.6 C) 98.3 F (36.8 C)  TempSrc: Oral Oral Oral Oral  SpO2: 99% 100% 97% 100%  Weight:      Height:        Intake/Output Summary (Last 24 hours) at 02/06/16 1225 Last data filed at 02/06/16 0518  Gross per 24 hour  Intake               90 ml  Output              300 ml  Net             -210 ml   Filed Weights   02/05/16 2001  Weight: 104.1 kg (229 lb 6.4 oz)    Examination: General exam: Appears calm and comfortable  Respiratory system: Clear to auscultation. Respiratory effort normal. Cardiovascular system: S1 & S2 heard, RRR. No JVD, murmurs, rubs, gallops or clicks. No pedal edema. Gastrointestinal system: Abdomen is nondistended, soft and nontender. No organomegaly or masses felt. Normal bowel sounds heard. Central nervous system: Alert and oriented. No focal neurological deficits. Extremities: Symmetric 5 x 5 power. Skin: No rashes, lesions or ulcers Psychiatry: Judgement and insight appear normal. Mood & affect appropriate.   Data Reviewed: I have personally reviewed following labs and imaging studies  CBC:  Recent Labs Lab 02/05/16 1715 02/05/16 1723  WBC 7.2  --  NEUTROABS 4.5  --   HGB 16.6 16.3  HCT 46.9 48.0  MCV 87.5  --   PLT 162  --    Basic Metabolic Panel:  Recent Labs Lab 02/05/16 1715 02/05/16 1723  NA 128* 137  K 3.6 3.8  CL 96* 97*  CO2 23  --   GLUCOSE 234* 232*  BUN 15 14  CREATININE 1.19 1.00  CALCIUM 8.4*  --    GFR: Estimated Creatinine Clearance: 109 mL/min (by C-G formula based on SCr of 1 mg/dL). Liver Function Tests:  Recent Labs Lab 02/05/16 1715  AST 22  ALT 21  ALKPHOS 79  BILITOT 0.6  PROT 7.5    ALBUMIN 4.2   No results for input(s): LIPASE, AMYLASE in the last 168 hours. No results for input(s): AMMONIA in the last 168 hours. Coagulation Profile:  Recent Labs Lab 02/05/16 1715  INR 0.98   Cardiac Enzymes: No results for input(s): CKTOTAL, CKMB, CKMBINDEX, TROPONINI in the last 168 hours. BNP (last 3 results) No results for input(s): PROBNP in the last 8760 hours. HbA1C: No results for input(s): HGBA1C in the last 72 hours. CBG:  Recent Labs Lab 02/05/16 2034 02/06/16 0759 02/06/16 1144  GLUCAP 248* 193* 171*   Lipid Profile:  Recent Labs  02/06/16 0557  CHOL 222*  HDL 33*  LDLCALC 149*  TRIG 200*  CHOLHDL 6.7   Thyroid Function Tests: No results for input(s): TSH, T4TOTAL, FREET4, T3FREE, THYROIDAB in the last 72 hours. Anemia Panel: No results for input(s): VITAMINB12, FOLATE, FERRITIN, TIBC, IRON, RETICCTPCT in the last 72 hours. Urine analysis:    Component Value Date/Time   COLORURINE YELLOW 02/06/2016 0655   APPEARANCEUR CLEAR 02/06/2016 0655   LABSPEC 1.020 02/06/2016 0655   PHURINE 6.0 02/06/2016 0655   GLUCOSEU 250 (A) 02/06/2016 0655   HGBUR NEGATIVE 02/06/2016 0655   BILIRUBINUR NEGATIVE 02/06/2016 0655   KETONESUR NEGATIVE 02/06/2016 0655   PROTEINUR 30 (A) 02/06/2016 0655   NITRITE NEGATIVE 02/06/2016 0655   LEUKOCYTESUR NEGATIVE 02/06/2016 0655   Sepsis Labs: (procalcitonin:4,lacticidven:4)  )No results found for this or any previous visit (from the past 240 hour(s)).   Invalid input(s): PROCALCITONIN, LACTICACIDVEN   Radiology Studies: Ct Head Wo Contrast  Result Date: 02/05/2016 CLINICAL DATA:  Patient with right-sided weakness and right-sided facial droop. Code stroke. EXAM: CT HEAD WITHOUT CONTRAST TECHNIQUE: Contiguous axial images were obtained from the base of the skull through the vertex without intravenous contrast. COMPARISON:  Brain MRI 10/01/2015; brain CT 04/08/2015 FINDINGS: Periventricular and  subcortical white matter hypodensity compatible with chronic microvascular ischemic changes. Multiple old bilateral basal ganglia lacunar infarcts, left-greater-than-right. Re- demonstrated area of encephalomalacia within the left parietal lobe. More centrally within this region is a new focal area of increased attenuation measuring 11 x 12 mm (image 17; series 2) most compatible with mineralization when compared with prior MRI. No significant mass effect. Orbits are unremarkable. Paranasal sinuses are well aerated. Mastoid air cells unremarkable. Calvarium is intact. IMPRESSION: No acute intracranial process. High attenuation within the chronic left parietal lobe infarct is favored represent mineralization. Critical Value/emergent results were called by telephone at the time of interpretation on 02/05/2016 at 5:52 pm to Dr. Loren Racer , who verbally acknowledged these results. Electronically Signed   By: Annia Belt M.D.   On: 02/05/2016 18:04   Mr Maxine Glenn Head Wo Contrast  Result Date: 02/06/2016 CLINICAL DATA:  Right-sided weakness and facial droop. EXAM: MRI HEAD WITHOUT CONTRAST MRA HEAD WITHOUT CONTRAST  TECHNIQUE: Multiplanar, multiecho pulse sequences of the brain and surrounding structures were obtained without intravenous contrast. Angiographic images of the head were obtained using MRA technique without contrast. COMPARISON:  10/01/2015 FINDINGS: Intermittently motion degraded MRI HEAD FINDINGS Calvarium and upper cervical spine: No focal marrow signal abnormality. Orbits: Negative. Sinuses and Mastoids: Chronic bilateral mastoid fluid with negative nasopharynx. Brain: Moderate area of cortically based restricted diffusion in the left posterior temporal and parietal cortex. This is in the MCA distribution. The acute infarct neighbors a remote infarct more posteriorly, which shows intrinsic T1 hyperintensity from the mineralization. Accounting for this there is no hemorrhagic conversion of the acute  infarct. There is chronic white matter disease. Remote perforator infarct in the left basal ganglia affecting caudate, putamen, and internal capsule. Remote lacunar infarcts in the bilateral thalamus. There is associated wallerian degeneration seen in the left cerebral peduncle. The brainstem is atrophic. MRA HEAD FINDINGS Symmetric carotid arteries. Atheromatous type irregularity of the bilateral carotid siphons without flow limiting stenosis. After the anterior temporal branch, there is chronic right M1 occlusion. Associated collaterals from the anterior cerebral territory seen over the right frontal convexity. Collateral flow seen within the distal right MCA branches at the upper sylvian fissure. Atheromatous narrowing seen in the bilateral ACA and left MCA distribution, including chronic high-grade stenosis of a left M2 insular branch. No progression compared to prior. Mild left vertebral artery dominance. Inferior cerebellar arteries are not well visualized. Poor flow in left PCA branches beyond the P3 segment, stable. Mild right P2 segment narrowing with improved MR appearance. IMPRESSION: 1. Moderate-sized left MCA territory acute infarct affecting the posterior temporal and parietal cortex. This neighbors a remote left parietal infarct. No hemorrhagic conversion. 2. Remote left perforator infarcts with left corticospinal wallerian degeneration. 3. No acute arterial finding. 4. 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 5. Diffuse intracranial atherosclerosis. No evidence of progressive stenosis compared to 10/01/2015. Electronically Signed   By: Marnee SpringJonathon  Watts M.D.   On: 02/06/2016 10:14   Mr Brain Wo Contrast  Result Date: 02/06/2016 CLINICAL DATA:  Right-sided weakness and facial droop. EXAM: MRI HEAD WITHOUT CONTRAST MRA HEAD WITHOUT CONTRAST TECHNIQUE: Multiplanar, multiecho pulse sequences of the brain and surrounding structures were obtained without intravenous contrast.  Angiographic images of the head were obtained using MRA technique without contrast. COMPARISON:  10/01/2015 FINDINGS: Intermittently motion degraded MRI HEAD FINDINGS Calvarium and upper cervical spine: No focal marrow signal abnormality. Orbits: Negative. Sinuses and Mastoids: Chronic bilateral mastoid fluid with negative nasopharynx. Brain: Moderate area of cortically based restricted diffusion in the left posterior temporal and parietal cortex. This is in the MCA distribution. The acute infarct neighbors a remote infarct more posteriorly, which shows intrinsic T1 hyperintensity from the mineralization. Accounting for this there is no hemorrhagic conversion of the acute infarct. There is chronic white matter disease. Remote perforator infarct in the left basal ganglia affecting caudate, putamen, and internal capsule. Remote lacunar infarcts in the bilateral thalamus. There is associated wallerian degeneration seen in the left cerebral peduncle. The brainstem is atrophic. MRA HEAD FINDINGS Symmetric carotid arteries. Atheromatous type irregularity of the bilateral carotid siphons without flow limiting stenosis. After the anterior temporal branch, there is chronic right M1 occlusion. Associated collaterals from the anterior cerebral territory seen over the right frontal convexity. Collateral flow seen within the distal right MCA branches at the upper sylvian fissure. Atheromatous narrowing seen in the bilateral ACA and left MCA distribution, including chronic high-grade stenosis of a left M2 insular  branch. No progression compared to prior. Mild left vertebral artery dominance. Inferior cerebellar arteries are not well visualized. Poor flow in left PCA branches beyond the P3 segment, stable. Mild right P2 segment narrowing with improved MR appearance. IMPRESSION: 1. Moderate-sized left MCA territory acute infarct affecting the posterior temporal and parietal cortex. This neighbors a remote left parietal infarct. No  hemorrhagic conversion. 2. Remote left perforator infarcts with left corticospinal wallerian degeneration. 3. No acute arterial finding. 4. 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 5. Diffuse intracranial atherosclerosis. No evidence of progressive stenosis compared to 10/01/2015. Electronically Signed   By: Marnee Spring M.D.   On: 02/06/2016 10:14        Scheduled Meds: .  stroke: mapping our early stages of recovery book   Does not apply Once  .  stroke: mapping our early stages of recovery book   Does not apply Once  . aspirin EC  81 mg Oral Daily  . atorvastatin  20 mg Oral q1800  . canagliflozin  100 mg Oral QAC breakfast  . carvedilol  12.5 mg Oral BID WC  . enoxaparin (LOVENOX) injection  40 mg Subcutaneous Q24H  . furosemide  80 mg Oral Daily  . gabapentin  400 mg Oral BID  . insulin aspart  0-5 Units Subcutaneous QHS  . insulin aspart  0-9 Units Subcutaneous TID WC  . insulin glargine  50 Units Subcutaneous Q2200  . linagliptin  2.5 mg Oral BID WC   And  . metFORMIN  1,000 mg Oral BID WC  . lisinopril  40 mg Oral Daily  . potassium chloride SA  20 mEq Oral Daily   Continuous Infusions:    LOS: 1 day    Time spent: 35 minutes    Georg Ang A, MD [facility] Pager [phone]  If 7PM-7AM, please contact night-coverage [email or web address] Password [facility] 02/06/2016, 12:25 PM

## 2016-02-06 NOTE — Progress Notes (Signed)
Inpatient Diabetes Program Recommendations  AACE/ADA: New Consensus Statement on Inpatient Glycemic Control (2015)  Target Ranges:  Prepandial:   less than 140 mg/dL      Peak postprandial:   less than 180 mg/dL (1-2 hours)      Critically ill patients:  140 - 180 mg/dL  Results for Edwin White, Donavon W (MRN [id number]) as of 02/06/2016 08:55  Ref. Range 02/05/2016 20:34 02/06/2016 07:59  Glucose-Capillary Latest Ref Range: 65 - 99 mg/dL 621248 (H) 308193 (H)    Review of Glycemic Control  Diabetes history: DM2 Outpatient Diabetes medications: Invokana 100 mg QAM, Lantus 50 units QHS, Jentadueto 2.10-998 mg BID Current orders for Inpatient glycemic control:  Invokana 100 mg QAM, Lantus 50 units QHS, Tradjenta 2.5 mg BID, Metformin XR 1000 mg BID, Novolog 0-9 units TID with meals, Novolog 0-5 units QHS  Inpatient Diabetes Program Recommendations: Insulin - Basal: No Lantus was given last night and fasting glucose 193 mg/dl this morning. Please consider decreasing Lantus to 20 units QHS (based on 104 kg x 0.2 units). Correction (SSI): If patient remains NPO, please consider changing frequency of CBGs and Novolog correction to ACHS. Oral Agents: Please consider discontinuing oral DM medications while inpatient. HgbA1C: A1C in process.  Thanks, Orlando PennerMarie Khy Pitre, RN, MSN, CDE Diabetes Coordinator Inpatient Diabetes Program [phone] (Team Pager from 8am to 5pm) [phone] ([facility] office) [phone] [facility] office) [phone] [facility] office)

## 2016-02-06 NOTE — Progress Notes (Signed)
Echo reviewed this evening showed LV apical thrombus. I paged primary Dr Arthor CaptainElmahi x 2 without response. Verbally discussed case with Dr Janann Coloneloonqah neurology. There is concern for anticoagulation this close to recent infarct and risk for hemorrhagic conversion. He does not recommend starting anticoag tonight. Defer further management to primary team, appears full neuro consult is pending.   Dominga FerryJ Marsha Hillman MD

## 2016-02-06 NOTE — Progress Notes (Signed)
*  PRELIMINARY RESULTS* Echocardiogram 2D Echocardiogram with definity has been performed.  Jeryl Columbialliott, Jerline Linzy 02/06/2016, 4:05 PM

## 2016-02-06 NOTE — Progress Notes (Signed)
SLP called. No answer. Left message and will try to call again. RN will continue to monitor. Lesly Dukesachel J Everett, RN

## 2016-02-06 NOTE — Evaluation (Signed)
Physical Therapy Evaluation Patient Details Name: Edwin White MRN: [id number] DOB: 10-08-1965 Today's Date: 02/06/2016   History of Present Illness  50 yo M admitted 02/05/2016 due to R sided weakness and speech difficulties.  PMH: DM, stroke, NSTEMI, post infarct apical thrombus, ischemic cardiomyopathy EF 40-45% in 2012, non-compliance, gastroparesis, CHF, CAD s/p CABG x 5 in 2007, crescendo TIA's, HTN poorly controlled, coronary angioplasty with stent placement.  Clinical Impression  Pt received sitting up in the chair, and was agreeable to PT evaluation.  He expressed that he lives alone, and is independent with all functional mobility, ADL's, and IADL's, and still drives.  Pt continues to demonstrate independence with all functional mobility, and does not demonstrate need for skilled PT at this time.  PT will sign off.     Follow Up Recommendations No PT follow up    Equipment Recommendations  None recommended by PT    Recommendations for Other Services       Precautions / Restrictions Precautions Precaution Comments: Pt expressed "Yea" he has had falls within the last 6 months.  He states  1 or 2 falls, but not able to elaborate due to expressive difficulties.  Restrictions Weight Bearing Restrictions: No      Mobility  Bed Mobility Overal bed mobility:  (NA - pt sitting up in the chair upon arrival. )                Transfers Overall transfer level: Independent Equipment used: None                Ambulation/Gait Ambulation/Gait assistance: Independent Ambulation Distance (Feet): 300 Feet Assistive device: None Gait Pattern/deviations: WFL(Within Functional Limits)        Stairs            Wheelchair Mobility    Modified Rankin (Stroke Patients Only)       Balance Overall balance assessment: No apparent balance deficits (not formally assessed)                                           Pertinent Vitals/Pain Pain  Assessment: No/denies pain    Home Living   Living Arrangements: Alone   Type of Home: Apartment Home Access: Level entry     Home Layout: One level Home Equipment: None      Prior Function     Gait / Transfers Assistance Needed: Pt reprots he is independent with ambulation.   ADL's / Homemaking Assistance Needed: independent with dressing/bathing.         Hand Dominance        Extremity/Trunk Assessment   Upper Extremity Assessment: RUE deficits/detail RUE Deficits / Details: From previous CVA with limited shoulder flexion.          Lower Extremity Assessment: Overall WFL for tasks assessed         Communication   Communication: Expressive difficulties  Cognition Arousal/Alertness: Awake/alert Behavior During Therapy: WFL for tasks assessed/performed Overall Cognitive Status: Impaired/Different from baseline Area of Impairment: Orientation Orientation Level: Situation;Disoriented to (difficult to determine due to expressive difficulties. )                  General Comments      Exercises        Assessment/Plan    PT Assessment Patent does not need any further PT services  PT Diagnosis Abnormality of gait  PT Problem List    PT Treatment Interventions     PT Goals (Current goals can be found in the Care Plan section) Acute Rehab PT Goals PT Goal Formulation: All assessment and education complete, DC therapy    Frequency     Barriers to discharge        Co-evaluation               End of Session Equipment Utilized During Treatment: Gait belt Activity Tolerance: Patient tolerated treatment well Patient left: in chair;with call bell/phone within reach      Functional Assessment Tool Used: [employer] AM-PAC "6-clicks"  Functional Limitation: Mobility: Walking and moving around Mobility: Walking and Moving Around Current Status [phone](G8978): At least 1 percent but less than 20 percent impaired, limited or  restricted Mobility: Walking and Moving Around Goal Status [phone](G8979): At least 1 percent but less than 20 percent impaired, limited or restricted Mobility: Walking and Moving Around Discharge Status [phone](G8980): At least 1 percent but less than 20 percent impaired, limited or restricted    Time: [id number] PT Time Calculation (min) (ACUTE ONLY): 17 min   Charges:   PT Evaluation $PT Eval Low Complexity: 1 Procedure     PT G Codes:   PT G-Codes **NOT FOR INPATIENT CLASS** Functional Assessment Tool Used: [employer] AM-PAC "6-clicks"  Functional Limitation: Mobility: Walking and moving around Mobility: Walking and Moving Around Current Status [phone]([id number]): At least 1 percent but less than 20 percent impaired, limited or restricted Mobility: Walking and Moving Around Goal Status [phone](G8979): At least 1 percent but less than 20 percent impaired, limited or restricted Mobility: Walking and Moving Around Discharge Status [phone](G8980): At least 1 percent but less than 20 percent impaired, limited or restricted    Lurena JoinerRebecca Montzerrat Brunell 02/06/2016, 1:33 PM

## 2016-02-07 LAB — GLUCOSE, CAPILLARY
Glucose-Capillary: 175 mg/dL — ABNORMAL HIGH (ref 65–99)
Glucose-Capillary: 163 mg/dL — ABNORMAL HIGH (ref 65–99)
Glucose-Capillary: 133 mg/dL — ABNORMAL HIGH (ref 65–99)
Glucose-Capillary: 122 mg/dL — ABNORMAL HIGH (ref 65–99)

## 2016-02-07 LAB — HEMOGLOBIN A1C
Hgb A1c MFr Bld: 8.6 % — ABNORMAL HIGH (ref 4.8–5.6)
Mean Plasma Glucose: 200 mg/dL

## 2016-02-07 MED ORDER — APIXABAN 5 MG PO TABS
10.0000 mg | ORAL_TABLET | Freq: Two times a day (BID) | ORAL | Status: DC
  Administered 2016-02-07: 10 mg via ORAL
  Filled 2016-02-07: qty 2

## 2016-02-07 MED ORDER — APIXABAN 5 MG PO TABS: 5.0000 mg | ORAL_TABLET | Freq: Two times a day (BID) | ORAL | Status: DC

## 2016-02-07 NOTE — Progress Notes (Addendum)
PROGRESS NOTE  Leonides CaveDavid W Dinkel  ZOX:[id number] DOB: 13-Oct-1965 DOA: 02/05/2016 PCP: Elige RadonJoshua A Dettinger, MD Outpatient Specialists:  Subjective: No changes clinically since yesterday, minimal right-sided weakness but has expressive aphasia.  Brief Narrative:  50 year old male with history of DM, stroke, NSTEMI, post infarct apical thrombus, ischemic cardiomyopathy, non compliance and gastroparesis. Patient has speech problems and could not or would not give much history. There is likely component of word finding problems. No collateral informant available. Patient presents to the ER with right sided weakness and speech difficulties. Time of onset of symptoms is unclear. ER physician has discussed with the Neurologist on call and it has been decided that the patient will be admitted to this hospital. CT head result is not clear. No headache, no neck pain, no SOB, no fever or chills, no GI symptoms and no urinary symptoms.  Assessment & Plan:   Principal Problem:   Acute CVA (cerebrovascular accident) (HCC) Active Problems:   Chronic systolic heart failure (HCC)   Ischemic cardiomyopathy   Hypertension   CVA (cerebral infarction)   Acute CVA -Presented with expressive aphasia, minimal right-sided weakness., MRI showed acute infarct involving the left MCA territory. -He also have remote left parietal infarct close to this new infarct. Chronic M1 occlusion. -Patient is on aspirin, technically this is aspirin failure. -Continue stroke workup, aspirin for now, patient will need anticoagulation in 1 week.  Left ventricular apical thrombosis -Was found on 2-D echocardiogram, per cardiology note tried to page me twice. -I did receive 1 page at 5:36 PM to call back number [phone], I was not able to call that number back. -Initially wanted to start on Eliquis, discussed with Dr. Gerilyn Pilgrimoonquah, recommended to hold anticoagulation for at least a week. -High risk of hemorrhagic conversion because of  the size of the infarct, hold Eliquis.  Chronic systolic and diastolic CHF -Patient is euvolemic no evidence of decompensation, last 2-D echo was in 2013 with LVEF of 25-30% with diffuse hypokinesis. -2-D echo showed severe LV dysfunction with EF of 10-15% and grade 2 diastolic dysfunction. -Does not have acute exacerbation, appears to be euvolemic. -Discussed with Dr. Wyline MoodBranch over the phone, recommend continued medical treatment for now. -Follow-up as outpatient for further treatment (and evaluation for the need of ICD).  Dysphagia -Seen by SLP, moderate aspiration risk, started on dysphagia 3 with thin liquids. -SLP to follow for language deficit.  Essential hypertension -We'll allow permissive blood pressure.  History of stroke -Has chronic M1 occlusion from previous stroke.  DVT prophylaxis: SQ heparin Code Status: Full Code Family Communication:  Disposition Plan:  Diet: DIET DYS 3 Room service appropriate? Yes; Fluid consistency: Thin  Consultants:   Neuro, full consult to follow  Procedures:   None  Antimicrobials:   None  Objective: Vitals:   02/07/16 0400 02/07/16 0600 02/07/16 0922 02/07/16 1025  BP: 122/77 130/78 120/66 113/76  Pulse: 77 78 82 83  Resp: 20 20  18   Temp: 98.7 F (37.1 C)   98.7 F (37.1 C)  TempSrc: Oral   Oral  SpO2: 99% 99%  99%  Weight: 103.2 kg (227 lb 8.2 oz)     Height:       No intake or output data in the 24 hours ending 02/07/16 1134 Filed Weights   02/05/16 2001 02/07/16 0400  Weight: 104.1 kg (229 lb 6.4 oz) 103.2 kg (227 lb 8.2 oz)    Examination: General exam: Appears calm and comfortable  Respiratory system: Clear to auscultation.  Respiratory effort normal. Cardiovascular system: S1 & S2 heard, RRR. No JVD, murmurs, rubs, gallops or clicks. No pedal edema. Gastrointestinal system: Abdomen is nondistended, soft and nontender. No organomegaly or masses felt. Normal bowel sounds heard. Central nervous system: Alert and  oriented. No focal neurological deficits. Extremities: Symmetric 5 x 5 power. Skin: No rashes, lesions or ulcers Psychiatry: Judgement and insight appear normal. Mood & affect appropriate.   Data Reviewed: I have personally reviewed following labs and imaging studies  CBC:  Recent Labs Lab 02/05/16 1715 02/05/16 1723  WBC 7.2  --   NEUTROABS 4.5  --   HGB 16.6 16.3  HCT 46.9 48.0  MCV 87.5  --   PLT 162  --    Basic Metabolic Panel:  Recent Labs Lab 02/05/16 1715 02/05/16 1723  NA 128* 137  K 3.6 3.8  CL 96* 97*  CO2 23  --   GLUCOSE 234* 232*  BUN 15 14  CREATININE 1.19 1.00  CALCIUM 8.4*  --    GFR: Estimated Creatinine Clearance: 108.5 mL/min (by C-G formula based on SCr of 1 mg/dL). Liver Function Tests:  Recent Labs Lab 02/05/16 1715  AST 22  ALT 21  ALKPHOS 79  BILITOT 0.6  PROT 7.5  ALBUMIN 4.2   No results for input(s): LIPASE, AMYLASE in the last 168 hours. No results for input(s): AMMONIA in the last 168 hours. Coagulation Profile:  Recent Labs Lab 02/05/16 1715  INR 0.98   Cardiac Enzymes: No results for input(s): CKTOTAL, CKMB, CKMBINDEX, TROPONINI in the last 168 hours. BNP (last 3 results) No results for input(s): PROBNP in the last 8760 hours. HbA1C:  Recent Labs  02/06/16 0553  HGBA1C 8.6*   CBG:  Recent Labs Lab 02/05/16 2034 02/06/16 0759 02/06/16 1144 02/06/16 1709 02/06/16 2215  GLUCAP 248* 193* 171* 266* 149*   Lipid Profile:  Recent Labs  02/06/16 0557  CHOL 222*  HDL 33*  LDLCALC 149*  TRIG 200*  CHOLHDL 6.7   Thyroid Function Tests: No results for input(s): TSH, T4TOTAL, FREET4, T3FREE, THYROIDAB in the last 72 hours. Anemia Panel: No results for input(s): VITAMINB12, FOLATE, FERRITIN, TIBC, IRON, RETICCTPCT in the last 72 hours. Urine analysis:    Component Value Date/Time   COLORURINE YELLOW 02/06/2016 0655   APPEARANCEUR CLEAR 02/06/2016 0655   LABSPEC 1.020 02/06/2016 0655   PHURINE 6.0  02/06/2016 0655   GLUCOSEU 250 (A) 02/06/2016 0655   HGBUR NEGATIVE 02/06/2016 0655   BILIRUBINUR NEGATIVE 02/06/2016 0655   KETONESUR NEGATIVE 02/06/2016 0655   PROTEINUR 30 (A) 02/06/2016 0655   NITRITE NEGATIVE 02/06/2016 0655   LEUKOCYTESUR NEGATIVE 02/06/2016 0655   Sepsis Labs: @LABRCNTIP (procalcitonin:4,lacticidven:4)  )No results found for this or any previous visit (from the past 240 hour(s)).   Invalid input(s): PROCALCITONIN, LACTICACIDVEN   Radiology Studies: Ct Head Wo Contrast  Result Date: 02/05/2016 CLINICAL DATA:  Patient with right-sided weakness and right-sided facial droop. Code stroke. EXAM: CT HEAD WITHOUT CONTRAST TECHNIQUE: Contiguous axial images were obtained from the base of the skull through the vertex without intravenous contrast. COMPARISON:  Brain MRI 10/01/2015; brain CT 04/08/2015 FINDINGS: Periventricular and subcortical white matter hypodensity compatible with chronic microvascular ischemic changes. Multiple old bilateral basal ganglia lacunar infarcts, left-greater-than-right. Re- demonstrated area of encephalomalacia within the left parietal lobe. More centrally within this region is a new focal area of increased attenuation measuring 11 x 12 mm (image 17; series 2) most compatible with mineralization when compared with prior MRI. No  significant mass effect. Orbits are unremarkable. Paranasal sinuses are well aerated. Mastoid air cells unremarkable. Calvarium is intact. IMPRESSION: No acute intracranial process. High attenuation within the chronic left parietal lobe infarct is favored represent mineralization. Critical Value/emergent results were called by telephone at the time of interpretation on 02/05/2016 at 5:52 pm to Dr. Loren Racer , who verbally acknowledged these results. Electronically Signed   By: Annia Belt M.D.   On: 02/05/2016 18:04   Mr Maxine Glenn Head Wo Contrast  Result Date: 02/06/2016 CLINICAL DATA:  Right-sided weakness and facial droop.  EXAM: MRI HEAD WITHOUT CONTRAST MRA HEAD WITHOUT CONTRAST TECHNIQUE: Multiplanar, multiecho pulse sequences of the brain and surrounding structures were obtained without intravenous contrast. Angiographic images of the head were obtained using MRA technique without contrast. COMPARISON:  10/01/2015 FINDINGS: Intermittently motion degraded MRI HEAD FINDINGS Calvarium and upper cervical spine: No focal marrow signal abnormality. Orbits: Negative. Sinuses and Mastoids: Chronic bilateral mastoid fluid with negative nasopharynx. Brain: Moderate area of cortically based restricted diffusion in the left posterior temporal and parietal cortex. This is in the MCA distribution. The acute infarct neighbors a remote infarct more posteriorly, which shows intrinsic T1 hyperintensity from the mineralization. Accounting for this there is no hemorrhagic conversion of the acute infarct. There is chronic white matter disease. Remote perforator infarct in the left basal ganglia affecting caudate, putamen, and internal capsule. Remote lacunar infarcts in the bilateral thalamus. There is associated wallerian degeneration seen in the left cerebral peduncle. The brainstem is atrophic. MRA HEAD FINDINGS Symmetric carotid arteries. Atheromatous type irregularity of the bilateral carotid siphons without flow limiting stenosis. After the anterior temporal branch, there is chronic right M1 occlusion. Associated collaterals from the anterior cerebral territory seen over the right frontal convexity. Collateral flow seen within the distal right MCA branches at the upper sylvian fissure. Atheromatous narrowing seen in the bilateral ACA and left MCA distribution, including chronic high-grade stenosis of a left M2 insular branch. No progression compared to prior. Mild left vertebral artery dominance. Inferior cerebellar arteries are not well visualized. Poor flow in left PCA branches beyond the P3 segment, stable. Mild right P2 segment narrowing with  improved MR appearance. IMPRESSION: 1. Moderate-sized left MCA territory acute infarct affecting the posterior temporal and parietal cortex. This neighbors a remote left parietal infarct. No hemorrhagic conversion. 2. Remote left perforator infarcts with left corticospinal wallerian degeneration. 3. No acute arterial finding. 4. 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 5. Diffuse intracranial atherosclerosis. No evidence of progressive stenosis compared to 10/01/2015. Electronically Signed   By: Marnee Spring M.D.   On: 02/06/2016 10:14   Mr Brain Wo Contrast  Result Date: 02/06/2016 CLINICAL DATA:  Right-sided weakness and facial droop. EXAM: MRI HEAD WITHOUT CONTRAST MRA HEAD WITHOUT CONTRAST TECHNIQUE: Multiplanar, multiecho pulse sequences of the brain and surrounding structures were obtained without intravenous contrast. Angiographic images of the head were obtained using MRA technique without contrast. COMPARISON:  10/01/2015 FINDINGS: Intermittently motion degraded MRI HEAD FINDINGS Calvarium and upper cervical spine: No focal marrow signal abnormality. Orbits: Negative. Sinuses and Mastoids: Chronic bilateral mastoid fluid with negative nasopharynx. Brain: Moderate area of cortically based restricted diffusion in the left posterior temporal and parietal cortex. This is in the MCA distribution. The acute infarct neighbors a remote infarct more posteriorly, which shows intrinsic T1 hyperintensity from the mineralization. Accounting for this there is no hemorrhagic conversion of the acute infarct. There is chronic white matter disease. Remote perforator infarct in the left basal  ganglia affecting caudate, putamen, and internal capsule. Remote lacunar infarcts in the bilateral thalamus. There is associated wallerian degeneration seen in the left cerebral peduncle. The brainstem is atrophic. MRA HEAD FINDINGS Symmetric carotid arteries. Atheromatous type irregularity of the bilateral  carotid siphons without flow limiting stenosis. After the anterior temporal branch, there is chronic right M1 occlusion. Associated collaterals from the anterior cerebral territory seen over the right frontal convexity. Collateral flow seen within the distal right MCA branches at the upper sylvian fissure. Atheromatous narrowing seen in the bilateral ACA and left MCA distribution, including chronic high-grade stenosis of a left M2 insular branch. No progression compared to prior. Mild left vertebral artery dominance. Inferior cerebellar arteries are not well visualized. Poor flow in left PCA branches beyond the P3 segment, stable. Mild right P2 segment narrowing with improved MR appearance. IMPRESSION: 1. Moderate-sized left MCA territory acute infarct affecting the posterior temporal and parietal cortex. This neighbors a remote left parietal infarct. No hemorrhagic conversion. 2. Remote left perforator infarcts with left corticospinal wallerian degeneration. 3. No acute arterial finding. 4. 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 5. Diffuse intracranial atherosclerosis. No evidence of progressive stenosis compared to 10/01/2015. Electronically Signed   By: Marnee Spring M.D.   On: 02/06/2016 10:14        Scheduled Meds: .  stroke: mapping our early stages of recovery book   Does not apply Once  . aspirin EC  81 mg Oral Daily  . atorvastatin  20 mg Oral q1800  . canagliflozin  100 mg Oral QAC breakfast  . carvedilol  12.5 mg Oral BID WC  . furosemide  80 mg Oral Daily  . gabapentin  400 mg Oral BID  . insulin aspart  0-5 Units Subcutaneous QHS  . insulin aspart  0-9 Units Subcutaneous TID WC  . insulin glargine  50 Units Subcutaneous Q2200  . linagliptin  2.5 mg Oral BID WC   And  . metFORMIN  1,000 mg Oral BID WC  . lisinopril  40 mg Oral Daily  . potassium chloride SA  20 mEq Oral Daily   Continuous Infusions:    LOS: 2 days    Time spent: 35  minutes    Apoorva Bugay A, MD [facility] Pager [phone]  If 7PM-7AM, please contact night-coverage [email or web address] Password [facility] 02/07/2016, 11:34 AM

## 2016-02-07 NOTE — Progress Notes (Signed)
ANTICOAGULATION CONSULT NOTE - Initial Consult  Pharmacy Consult for eliquis Indication: LV thrombus  Allergies  Allergen Reactions  . Pravastatin Other (See Comments)    PT CANNOT NOT WALK OR MOVE.    Patient Measurements: Height: 6' (182.9 cm) Weight: 227 lb 8.2 oz (103.2 kg) IBW/kg (Calculated) : 77.6   Vital Signs: Temp: 98.7 F (37.1 C) (08/08 0400) Temp Source: Oral (08/08 0400) BP: 130/78 (08/08 0600) Pulse Rate: 78 (08/08 0600)  Labs:  Recent Labs  02/05/16 1715 02/05/16 1723  HGB 16.6 16.3  HCT 46.9 48.0  PLT 162  --   APTT 27  --   LABPROT 12.9  --   INR 0.98  --   CREATININE 1.19 1.00    Estimated Creatinine Clearance: 108.5 mL/min (by C-G formula based on SCr of 1 mg/dL).   Medical History: Past Medical History:  Diagnosis Date  . CHF (congestive heart failure) (HCC)    No recurrent admissions for heart failure.  . Coronary artery disease    Status post coronary bypass grafting in 2007  . Crescendo transient ischemic attacks   . Functional bowel disorder    Bloating and abdominal cramping as well as constipation requiring lubiprostone  . Gastroparesis    Gastric emptying study scheduled  . GERD (gastroesophageal reflux disease)   . Heart attack [facility]) September 13, 2004   Followed by bypass surgery  . History of medication noncompliance   . Hyperlipidemia   . Hypertension    Poorly controlled  . Ischemic cardiomyopathy    Ejection fraction improved from 15%-20% to 40%-45% in 2012 by echocardiogram  . Moderate mitral regurgitation by prior echocardiogram   . NSTEMI (non-ST elevated myocardial infarction) (HCC) 12/31/2013  . Post-infarction apical thrombus (HCC)    Anticoagulation discontinued  . Stroke [facility]) 04/2012   denies residual on 12/31/2013  . Type 2 diabetes mellitus (HCC)     Medications:  Prescriptions Prior to Admission  Medication Sig Dispense Refill Last Dose  . aspirin EC 81 MG tablet Take 1 tablet (81 mg total) by mouth  daily. 90 tablet 2 Past Week at Unknown time  . atorvastatin (LIPITOR) 20 MG tablet Take 1 tablet (20 mg total) by mouth daily. 90 tablet 2 Past Week at Unknown time  . canagliflozin (INVOKANA) 100 MG TABS tablet Take 1 tablet (100 mg total) by mouth daily before breakfast. 30 tablet 2 Past Week at Unknown time  . carvedilol (COREG) 12.5 MG tablet Take 12.5 mg by mouth 2 (two) times daily with a meal.   Past Week at Unknown time  . furosemide (LASIX) 80 MG tablet TAKE 1 TABLET BY MOUTH DAILY 30 tablet 3 Past Week at Unknown time  . gabapentin (NEURONTIN) 400 MG capsule Take 1 capsule (400 mg total) by mouth 2 (two) times daily. 180 capsule 2 Past Week at Unknown time  . Insulin Glargine (LANTUS SOLOSTAR) 100 UNIT/ML Solostar Pen Inject 50 Units into the skin daily at 10 pm. 5 pen PRN Past Week at Unknown time  . JENTADUETO XR 2.10-998 MG TB24 TAKE 1 TABLET BY MOUTH TWICE DAILY 60 tablet 2 Past Week at Unknown time  . lisinopril (PRINIVIL,ZESTRIL) 40 MG tablet Take 1 tablet (40 mg total) by mouth daily. 90 tablet 2 Past Week at Unknown time  . NIFEdipine (PROCARDIA-XL/ADALAT CC) 60 MG 24 hr tablet Take 1 tablet (60 mg total) by mouth daily. 90 tablet 2 Past Week at Unknown time  . nitroGLYCERIN (NITROSTAT) 0.4 MG SL tablet Place  1 tablet (0.4 mg total) under the tongue every 5 (five) minutes x 3 doses as needed for chest pain. 25 tablet 2 Taking  . potassium chloride SA (K-DUR,KLOR-CON) 20 MEQ tablet TAKE 1 TABLET BY MOUTH DAILY 30 tablet 3 Past Week at Unknown time  . Blood Glucose Monitoring Suppl (ACCU-CHEK AVIVA) device Use as instructed 1 each 0     Assessment: 50 yo main found to have LV apical thrombus on ECHO.  He had a recent CVA and has a h/o CVA and post infarct apical thrombus.   Goal of Therapy:  Appropriate anticoagulation    Plan:  Eliquis 10 mg po bid for 7 days then 5 mg po bid Monitor for bleeding complications  Edwin White 02/07/2016,8:49 AM

## 2016-02-07 NOTE — Consult Note (Addendum)
[facility] Edwin White A. Gerilyn Pilgrim, MD     [email or web address]          Edwin White is an 50 y.o. male.   ASSESSMENT/PLAN: 1. 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 The patient has had other cortical infarct suggestive of cardioembolic phenomena. Patient seems to be at increased risk of recurrent event and therefore should be placed on long-term anticoagulation. However, given the large stroke of his current event, we should wait for 1 week before the patient displaced and anticoagulation so as to minimize the risk of hemorrhagic transformation. Risk factors poorly controlled hypertension, diabetes mellitus, dyslipidemia, and previous ischemic stroke.   2. High risk for developing vascular dementia given multiple infarcts.     RECOMMENDATION: Agree with antiplatelet agents for now. The patient should be placed on long-term anticoagulation. Again, we recommend this be initiated a week from now to minimize risk of hemorrhagic transformation. Carotid duplex Doppler. Additional labs for RPR, vitamin B12 level and homocystine level.      The patient is a 51 year old white male who presents with the hospital with acute left hemiparesis. It appears she also presented with difficulty speaking. Clearly aphasic which limits the evaluation of this time. It appears patient has had previous ischemic strokes. Workup shows a left apical thrombus and this severely reduced ejection fraction of 10%. The left atrium is markedly dilated. Patient is unable to provide a history given the profound aphasia.    GENERAL: Pleasant male who does attend a corporate with evaluation although severely limited given the severe language impairment.  HEENT: Normal  ABDOMEN: soft  EXTREMITIES: No edema   BACK: Normal  SKIN: Normal by inspection.    MENTAL STATUS: He is awake and alert. He attempts to follow commands with prompting. He has severe word finding problems only says yes and  perseverates. He has severe difficulties following commands due to profound aphasia.   CRANIAL NERVES: Pupils are equal, round and reactive to light and accomodation; extra ocular movements are full, there is no significant nystagmus; visual fields - very limited but appears to be full; upper and lower facial muscles are normal in strength and symmetric, there is mild flattening of the nasolabial fold on the left; tongue is midline.Marland Kitchen  MOTOR: There is a mild to moderate right hemiparesis. Right upper extremity 4 minus and right leg 4 minus. There is a profound drift right leg. No drift of the right upper extremity. No drift on the left side.  COORDINATION: Left finger to nose is normal, right finger to nose is normal, No rest tremor; no intention tremor; no postural tremor; no bradykinesia.  REFLEXES: Deep tendon reflexes are symmetrical and normal. Babinski reflexes are flexor on the left extensor on the right.   SENSATION: Response to painful stimuli bilaterally. No evidence of extinction or neglect but difficult given the cognition.   NIH stroke scale 1, 1, 1, 2, 2 total 7.   Blood pressure 127/74, pulse 81, temperature 99 F (37.2 C), temperature source Oral, resp. rate 20, height 6' (1.829 m), weight 227 lb 8.2 oz (103.2 kg), SpO2 100 %.  Past Medical History:  Diagnosis Date  . CHF (congestive heart failure) (HCC)    No recurrent admissions for heart failure.  . Coronary artery disease    Status post coronary bypass grafting in 2007  . Crescendo transient ischemic attacks   . Functional bowel disorder    Bloating and abdominal cramping as well as constipation requiring lubiprostone  . Gastroparesis  Gastric emptying study scheduled  . GERD (gastroesophageal reflux disease)   . Heart attack [facility]) September 13, 2004   Followed by bypass surgery  . History of medication noncompliance   . Hyperlipidemia   . Hypertension    Poorly controlled  . Ischemic cardiomyopathy    Ejection  fraction improved from 15%-20% to 40%-45% in 2012 by echocardiogram  . Moderate mitral regurgitation by prior echocardiogram   . NSTEMI (non-ST elevated myocardial infarction) (HCC) 12/31/2013  . Post-infarction apical thrombus (HCC)    Anticoagulation discontinued  . Stroke [name]) 04/2012   denies residual on 12/31/2013  . Type 2 diabetes mellitus (HCC)     Past Surgical History:  Procedure Laterality Date  . CORONARY ANGIOPLASTY WITH STENT PLACEMENT  2006  . CORONARY ARTERY BYPASS GRAFT  2007   "CABG X5"  . LEFT HEART CATHETERIZATION WITH CORONARY/GRAFT ANGIOGRAM N/A 01/04/2014   Procedure: LEFT HEART CATHETERIZATION WITH Isabel Caprice;  Surgeon: Kathleene Hazel, MD;  Location: [facility] CATH LAB;  Service: Cardiovascular;  Laterality: N/A;  . PERCUTANEOUS CORONARY STENT INTERVENTION (PCI-S)  01/04/2014   Procedure: PERCUTANEOUS CORONARY STENT INTERVENTION (PCI-S);  Surgeon: Kathleene Hazel, MD;  Location: [facility] CATH LAB;  Service: Cardiovascular;;    Family History  Problem Relation Age of Onset  . Adopted: Yes  . Coronary artery disease Father   . Cancer Mother     Social History:  reports that he has been smoking Cigarettes.  He started smoking about 36 years ago. He has a 36.00 pack-year smoking history. He has never used smokeless tobacco. He reports that he does not drink alcohol or use drugs.  Allergies:  Allergies  Allergen Reactions  . Pravastatin Other (See Comments)    PT CANNOT NOT WALK OR MOVE.    Medications: Prior to Admission medications   Medication Sig Start Date End Date Taking? Authorizing Provider  aspirin EC 81 MG tablet Take 1 tablet (81 mg total) by mouth daily. 07/13/15  Yes Elige Radon Dettinger, MD  atorvastatin (LIPITOR) 20 MG tablet Take 1 tablet (20 mg total) by mouth daily. 07/13/15  Yes Elige Radon Dettinger, MD  canagliflozin (INVOKANA) 100 MG TABS tablet Take 1 tablet (100 mg total) by mouth daily before breakfast. 11/04/15  Yes Elige Radon  Dettinger, MD  carvedilol (COREG) 12.5 MG tablet Take 12.5 mg by mouth 2 (two) times daily with a meal.   Yes Historical Provider, MD  furosemide (LASIX) 80 MG tablet TAKE 1 TABLET BY MOUTH DAILY 01/19/16  Yes Laqueta Linden, MD  gabapentin (NEURONTIN) 400 MG capsule Take 1 capsule (400 mg total) by mouth 2 (two) times daily. 07/13/15  Yes Elige Radon Dettinger, MD  Insulin Glargine (LANTUS SOLOSTAR) 100 UNIT/ML Solostar Pen Inject 50 Units into the skin daily at 10 pm. 07/13/15  Yes Elige Radon Dettinger, MD  JENTADUETO XR 2.10-998 MG TB24 TAKE 1 TABLET BY MOUTH TWICE DAILY 01/19/16  Yes Elige Radon Dettinger, MD  lisinopril (PRINIVIL,ZESTRIL) 40 MG tablet Take 1 tablet (40 mg total) by mouth daily. 07/13/15  Yes Elige Radon Dettinger, MD  NIFEdipine (PROCARDIA-XL/ADALAT CC) 60 MG 24 hr tablet Take 1 tablet (60 mg total) by mouth daily. 07/13/15  Yes Elige Radon Dettinger, MD  nitroGLYCERIN (NITROSTAT) 0.4 MG SL tablet Place 1 tablet (0.4 mg total) under the tongue every 5 (five) minutes x 3 doses as needed for chest pain. 01/05/14  Yes Brittainy Sherlynn Carbon, PA-C  potassium chloride SA (K-DUR,KLOR-CON) 20 MEQ tablet TAKE 1 TABLET BY  MOUTH DAILY 01/19/16  Yes Laqueta Linden, MD  Blood Glucose Monitoring Suppl (ACCU-CHEK AVIVA) device Use as instructed 01/09/16 01/08/17  Elige Radon Dettinger, MD    Scheduled Meds: .  stroke: mapping our early stages of recovery book   Does not apply Once  . aspirin EC  81 mg Oral Daily  . atorvastatin  20 mg Oral q1800  . canagliflozin  100 mg Oral QAC breakfast  . carvedilol  12.5 mg Oral BID WC  . furosemide  80 mg Oral Daily  . gabapentin  400 mg Oral BID  . insulin aspart  0-5 Units Subcutaneous QHS  . insulin aspart  0-9 Units Subcutaneous TID WC  . insulin glargine  50 Units Subcutaneous Q2200  . linagliptin  2.5 mg Oral BID WC   And  . metFORMIN  1,000 mg Oral BID WC  . lisinopril  40 mg Oral Daily  . potassium chloride SA  20 mEq Oral Daily   Continuous Infusions:    PRN Meds:.nitroGLYCERIN, senna-docusate     Results for orders placed or performed during the hospital encounter of 02/05/16 (from the past 48 hour(s))  Glucose, capillary     Status: Abnormal   Collection Time: 02/05/16  8:34 PM  Result Value Ref Range   Glucose-Capillary 248 (H) 65 - 99 mg/dL   Comment 1 Notify RN    Comment 2 Document in Chart   Hemoglobin A1c     Status: Abnormal   Collection Time: 02/06/16  5:53 AM  Result Value Ref Range   Hgb A1c MFr Bld 8.6 (H) 4.8 - 5.6 %    Comment: (NOTE)         Pre-diabetes: 5.7 - 6.4         Diabetes: >6.4         Glycemic control for adults with diabetes: <7.0    Mean Plasma Glucose 200 mg/dL    Comment: (NOTE) Performed At: [facility] [address] [place], [place] [id number] Mila Homer MD [id number]   Lipid panel     Status: Abnormal   Collection Time: 02/06/16  5:57 AM  Result Value Ref Range   Cholesterol 222 (H) 0 - 200 mg/dL   Triglycerides 295 (H) <150 mg/dL   HDL 33 (L) >28 mg/dL   Total CHOL/HDL Ratio 6.7 RATIO   VLDL 40 0 - 40 mg/dL   LDL Cholesterol 413 (H) 0 - 99 mg/dL    Comment:        Total Cholesterol/HDL:CHD Risk Coronary Heart Disease Risk Table                     Men   Women  1/2 Average Risk   3.4   3.3  Average Risk       5.0   4.4  2 X Average Risk   9.6   7.1  3 X Average Risk  23.4   11.0        Use the calculated Patient Ratio above and the CHD Risk Table to determine the patient's CHD Risk.        ATP III CLASSIFICATION (LDL):  <100     mg/dL   Optimal  244-010  mg/dL   Near or Above                    Optimal  130-159  mg/dL   Borderline  272-536  mg/dL   High  >644  mg/dL   Very High   Urine rapid drug screen (hosp performed)not at [facility]     Status: None   Collection Time: 02/06/16  6:55 AM  Result Value Ref Range   Opiates NONE DETECTED NONE DETECTED   Cocaine NONE DETECTED NONE DETECTED   Benzodiazepines NONE DETECTED NONE DETECTED   Amphetamines NONE  DETECTED NONE DETECTED   Tetrahydrocannabinol NONE DETECTED NONE DETECTED   Barbiturates NONE DETECTED NONE DETECTED    Comment:        DRUG SCREEN FOR MEDICAL PURPOSES ONLY.  IF CONFIRMATION IS NEEDED FOR ANY PURPOSE, NOTIFY LAB WITHIN 5 DAYS.        LOWEST DETECTABLE LIMITS FOR URINE DRUG SCREEN Drug Class       Cutoff (ng/mL) Amphetamine      1000 Barbiturate      200 Benzodiazepine   200 Tricyclics       300 Opiates          300 Cocaine          300 THC              50   Urinalysis, Routine w reflex microscopic (not at [facility])     Status: Abnormal   Collection Time: 02/06/16  6:55 AM  Result Value Ref Range   Color, Urine YELLOW YELLOW   APPearance CLEAR CLEAR   Specific Gravity, Urine 1.020 1.005 - 1.030   pH 6.0 5.0 - 8.0   Glucose, UA 250 (A) NEGATIVE mg/dL   Hgb urine dipstick NEGATIVE NEGATIVE   Bilirubin Urine NEGATIVE NEGATIVE   Ketones, ur NEGATIVE NEGATIVE mg/dL   Protein, ur 30 (A) NEGATIVE mg/dL   Nitrite NEGATIVE NEGATIVE   Leukocytes, UA NEGATIVE NEGATIVE  Urine microscopic-add on     Status: None   Collection Time: 02/06/16  6:55 AM  Result Value Ref Range   Squamous Epithelial / LPF NONE SEEN NONE SEEN   WBC, UA NONE SEEN 0 - 5 WBC/hpf   RBC / HPF 0-5 0 - 5 RBC/hpf   Bacteria, UA NONE SEEN NONE SEEN  Glucose, capillary     Status: Abnormal   Collection Time: 02/06/16  7:59 AM  Result Value Ref Range   Glucose-Capillary 193 (H) 65 - 99 mg/dL   Comment 1 Notify RN   Glucose, capillary     Status: Abnormal   Collection Time: 02/06/16 11:44 AM  Result Value Ref Range   Glucose-Capillary 171 (H) 65 - 99 mg/dL   Comment 1 Notify RN   Glucose, capillary     Status: Abnormal   Collection Time: 02/06/16  5:09 PM  Result Value Ref Range   Glucose-Capillary 266 (H) 65 - 99 mg/dL   Comment 1 Notify RN   Glucose, capillary     Status: Abnormal   Collection Time: 02/06/16 10:15 PM  Result Value Ref Range   Glucose-Capillary 149 (H) 65 - 99 mg/dL    Comment 1 Notify RN    Comment 2 Document in Chart   Glucose, capillary     Status: Abnormal   Collection Time: 02/07/16  8:37 AM  Result Value Ref Range   Glucose-Capillary 175 (H) 65 - 99 mg/dL   Comment 1 Notify RN    Comment 2 Document in Chart   Glucose, capillary     Status: Abnormal   Collection Time: 02/07/16 11:58 AM  Result Value Ref Range   Glucose-Capillary 163 (H) 65 - 99 mg/dL   Comment 1 Notify RN  Comment 2 Document in Chart   Glucose, capillary     Status: Abnormal   Collection Time: 02/07/16  4:17 PM  Result Value Ref Range   Glucose-Capillary 133 (H) 65 - 99 mg/dL   Comment 1 Notify RN    Comment 2 Document in Chart     Studies/Results:    TTE - Left ventricle: The cavity size was moderately dilated. Wall   thickness was normal. Systolic function was severely reduced. The   estimated ejection fraction was in the range of 10% to 15%.   Features are consistent with a pseudonormal left ventricular   filling pattern, with concomitant abnormal relaxation and   increased filling pressure (grade 2 diastolic dysfunction).   Doppler parameters are consistent with high ventricular filling   pressure. - Aortic valve: Valve area (VTI): 2.08 cm^2. Valve area (Vmax):   1.94 cm^2. - Left atrium: The atrium was moderately dilated. - Technically adequate study. Echocontrast was used to enhance   visualization. - There is a 1.4 x 1.1 cm apical thrombus.    BRAIN MRI/MRA EXAM: MRI HEAD WITHOUT CONTRAST  MRA HEAD WITHOUT CONTRAST  TECHNIQUE: Multiplanar, multiecho pulse sequences of the brain and surrounding structures were obtained without intravenous contrast. Angiographic images of the head were obtained using MRA technique without contrast.  COMPARISON:  10/01/2015  FINDINGS: Intermittently motion degraded  MRI HEAD FINDINGS  Calvarium and upper cervical spine: No focal marrow signal abnormality.  Orbits: Negative.  Sinuses and  Mastoids: Chronic bilateral mastoid fluid with negative nasopharynx.  Brain: Moderate area of cortically based restricted diffusion in the left posterior temporal and parietal cortex. This is in the MCA distribution. The acute infarct neighbors a remote infarct more posteriorly, which shows intrinsic T1 hyperintensity from the mineralization. Accounting for this there is no hemorrhagic conversion of the acute infarct.  There is chronic white matter disease. Remote perforator infarct in the left basal ganglia affecting caudate, putamen, and internal capsule. Remote lacunar infarcts in the bilateral thalamus. There is associated wallerian degeneration seen in the left cerebral peduncle. The brainstem is atrophic.  MRA HEAD FINDINGS  Symmetric carotid arteries. Atheromatous type irregularity of the bilateral carotid siphons without flow limiting stenosis. After the anterior temporal branch, there is chronic right M1 occlusion. Associated collaterals from the anterior cerebral territory seen over the right frontal convexity. Collateral flow seen within the distal right MCA branches at the upper sylvian fissure. Atheromatous narrowing seen in the bilateral ACA and left MCA distribution, including chronic high-grade stenosis of a left M2 insular branch. No progression compared to prior.  Mild left vertebral artery dominance. Inferior cerebellar arteries are not well visualized. Poor flow in left PCA branches beyond the P3 segment, stable. Mild right P2 segment narrowing with improved MR appearance.  IMPRESSION: 1. Moderate-sized left MCA territory acute infarct affecting the posterior temporal and parietal cortex. This neighbors a remote left parietal infarct. No hemorrhagic conversion. 2. Remote left perforator infarcts with left corticospinal wallerian degeneration. 3. No acute arterial finding. 4. 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 5. Diffuse  intracranial atherosclerosis. No evidence of progressive stenosis compared to 10/01/2015.     The brain MRI is reviewed in person. There is a large infarct seen on diffusion imaging involving the left posterior temporal and parietal areas. There are chronic infarcts involving the left parietal temporal region and left frontal region.      Jenson Beedle A. Gerilyn Pilgrim, M.D.  Diplomate, Biomedical engineer of Psychiatry and Neurology ( Neurology). 02/07/2016,  7:17 PM

## 2016-02-07 NOTE — Care Management Note (Signed)
Case Management Note  Patient Details  Name: Edwin White MRN: [id number] Date of Birth: 02/16/1966  SStatus of Service:  In process, will continue to follow  If discussed at Long Length of Stay Meetings, dates discussed:    Additional Comments: Met with patient and his three sisters. Patient adamant that he wants to go home. He is agreeable to having [facility] services. He adamantly refuses any possibility of going to a SNF. He is that SLP has recommended mechanical soft diet and 24 hour supervision. Sister's states he has children that are late teenager years but patient is not agreeable to having anyone stay with him. Between the three sisters, they state they will check on patient daily. PT, ST will be ordered for patient. Family and patient would like to use [facility]. Romualdo Bolk of [facility] notified and [facility] has 48 hours to make first visit once patient is discharged.   Edwin White, Chauncey Reading, RN 02/07/2016, 2:16 PM

## 2016-02-07 NOTE — Progress Notes (Signed)
Speech Language Pathology Treatment: Dysphagia;Cognitive-Linquistic  Patient Details Name: Edwin White MRN: [id number] DOB: 05-13-1966 Today's Date: 02/07/2016 Time:  -     Assessment / Plan / Recommendation Clinical Impression  Pt seen for diet tolerance and tx of aphasia. Educated and provided pt with a picture communication board for basic wants and needs. Pt requires verbal and visual cueing to use and is able to point to needs on board, though expressive output remains limited. Pt with limited spontaneous speech however able to verbalize y/n accurately. [facility] recommend pt have 24 hour care including assistance with all complex ADLs. ST also followed up for diet tolerance, assessed with thin liquids and mechanical soft snack. No overt signs or symptoms of aspiration with any PO this date. Continue dysphagia 3 (mechanical soft) and thin liquids with medicines whole in puree.  ST to continue to monitor   HPI HPI: Pt is a 50 year old male with history of DM, stroke, NSTEMI, post infarct apical thrombus, ischemic cardiomyopathy, non compliance and gastroparesis. Patient has speech problems and could not or would not give much history. There is likely component of word finding problems. No collateral informant available. Patient presents to the ER with right sided weakness and speech difficulties. MRI of head revealed Moderate-sized left MCA territory acute infarct affecting the posterior temporal and parietal cortex. This neighbors a remote left parietal infarct.       SLP Plan  Continue with current plan of care     Recommendations   strict 24 hour care[facility]              Plan: Continue with current plan of care     GO               Marcene Duoshelsea Sumney MA, CCC-SLP Acute Care Speech Language Pathologist    Edwin White, Edwin White 02/07/2016, 3:24 PM

## 2016-02-07 NOTE — Progress Notes (Signed)
Patient BP 97/35 heart rate 94.  Rechecked BP 104/69;  Paged Dr. Arthor CaptainElmahi and held 1700 Coreg per MD.

## 2016-02-08 DIAGNOSIS — E785 Hyperlipidemia, unspecified: Secondary | ICD-10-CM

## 2016-02-08 DIAGNOSIS — Z794 Long term (current) use of insulin: Secondary | ICD-10-CM

## 2016-02-08 DIAGNOSIS — I6330 Cerebral infarction due to thrombosis of unspecified cerebral artery: Secondary | ICD-10-CM

## 2016-02-08 DIAGNOSIS — Z720 Tobacco use: Secondary | ICD-10-CM

## 2016-02-08 DIAGNOSIS — I213 ST elevation (STEMI) myocardial infarction of unspecified site: Secondary | ICD-10-CM

## 2016-02-08 DIAGNOSIS — E1165 Type 2 diabetes mellitus with hyperglycemia: Secondary | ICD-10-CM

## 2016-02-08 DIAGNOSIS — I513 Intracardiac thrombosis, not elsewhere classified: Secondary | ICD-10-CM

## 2016-02-08 LAB — GLUCOSE, CAPILLARY: Glucose-Capillary: 108 mg/dL — ABNORMAL HIGH (ref 65–99)

## 2016-02-08 MED ORDER — ASPIRIN 325 MG PO TBEC: 325.0000 mg | DELAYED_RELEASE_TABLET | Freq: Every day | ORAL | 1 refills | Status: DC

## 2016-02-08 MED ORDER — NICOTINE 14 MG/24HR TD PT24: 14.0000 mg | MEDICATED_PATCH | Freq: Every day | TRANSDERMAL | 0 refills | Status: DC

## 2016-02-08 NOTE — Discharge Summary (Signed)
Physician Discharge Summary  Edwin White ZOX:[id number] DOB: Feb 09, 1966 DOA: 02/05/2016  PCP: Elige Radon Dettinger, MD  Admit date: 02/05/2016 Discharge date: 02/08/2016  Time spent: 35 minutes  Recommendations for Outpatient Follow-up:  1. Repeat BMET to follow electrolytes and renal function 2. Close follow up on his diabetes and HTN; adjust medications as needed to achieve goal 3. Will benefit of completing carotid duplex (patient decline it during admission) 4. Neurology follow up 5. Cardiology to start and follow up anticoagulation therapy and further decisions regarding needs of ICD and treatment of his heart failure.   Discharge Diagnoses:  Principal Problem:   Cerebrovascular accident (CVA) due to thrombosis of cerebral artery (HCC) Active Problems:   Chronic systolic congestive heart failure (HCC)   Ischemic cardiomyopathy   Hypertension   CVA (cerebral infarction)   Apical mural thrombus (HCC)   Tobacco abuse type 2 diabetes with hyperglycemia Essential HTN HLD  Discharge Condition: stable and improved. Discharge home with HHRN and HH SPL. Patient will follow up with PCP in 10 days and with cardiology in 1 week.  Diet recommendation: heart healthy and low carb diet. Dysphagia 3 diet and thin liquids.  Filed Weights   02/05/16 2001 02/07/16 0400  Weight: 104.1 kg (229 lb 6.4 oz) 103.2 kg (227 lb 8.2 oz)    History of present illness:  49 year old male with history of DM, stroke, NSTEMI, post infarct apical thrombus, ischemic cardiomyopathy, non compliance and gastroparesis. Who presented to ED with complaints of right side weakness and speech difficulties. No acute abnormalities seen and CT scan of the head and patient was admitted for CVA rule out.  Hospital Course:  Acute CVA: ischemic and affecting left MCA distribution -Presented with expressive aphasia and minimal right-sided weakness., MRI showed acute infarct involving the left MCA territory. -He also have  remote left parietal infarct close to this new infarct, with Chronic M1 occlusion. -Patient will be discharge on full dose aspirin and plans to initiate coumadin anticoagulation in 1 week -will continue risk factors modifications and statins  -outpatient follow up with neurology recommended -patient will also benefit of repeating carotid duplex as an outpatient (he decline test during this hospitalization)  Left ventricular apical thrombosis -Was found on 2-D echocardiogram during this admission  -Initially wanted to start anticoagulation, but per neurology recommendations (Dr. Gerilyn Pilgrim), will hold anticoagulation for at least a week, to decrease chances of hemorrhagic transformation (High risk of hemorrhagic conversion because of the size of the infarct).  Chronic systolic and diastolic CHF -Patient is euvolemic and doesn't present evidence of decompensation, last 2-D echo was in 2013 with LVEF of 25-30% with diffuse hypokinesis. -2-D echo during this admission showed severe LV dysfunction with EF of 10-15% and grade 2 diastolic dysfunction. Also with new apical thrombus. -Discussed with Dr. Wyline Mood; will arrange follow up in 1 week for coumadin initiation and further work up/assessment regarding needs for ICD and medication adjustments -will continue B-blocker and ACE  Dysphagia -Seen by SLP, moderate aspiration risk, started on dysphagia 3 with thin liquids. -SLP to follow for language deficit and further swallowing evaluation.  Essential hypertension -will continue antihypertensive agents -patient advise to be compliant with medication regimen  -advise to follow low sodium diet   History of stroke -Has chronic M1 occlusion from previous stroke. -will continue aspirin and risk factors modifications  -plan is to start coumadin as an outpatient for further stroke prevention -patient will need carotid duplex examination (as he refuse it while inpatient  during this  admission)  HLD -will continue statins  Type 2 diabetes with hyperglycemia -will continue SSI and resume oral hypoglycemic agents -PCP to adjust further as needed to ensure good control   Tobacco abuse  -cessation counseling provided -discharge with prescription for nicoderm  Procedures:  See below for x-ray reports   2-D echo: - Left ventricle: The cavity size was moderately dilated. Wall   thickness was normal. Systolic function was severely reduced. The   estimated ejection fraction was in the range of 10% to 15%.   Features are consistent with a pseudonormal left ventricular   filling pattern, with concomitant abnormal relaxation and   increased filling pressure (grade 2 diastolic dysfunction).   Doppler parameters are consistent with high ventricular filling   pressure. - Aortic valve: Valve area (VTI): 2.08 cm^2. Valve area (Vmax):   1.94 cm^2. - Left atrium: The atrium was moderately dilated. - Technically adequate study. Echocontrast was used to enhance   visualization. - There is a 1.4 x 1.1 cm apical thrombus.  Consultations:  Neurology   Discharge Exam: Vitals:   02/08/16 0600 02/08/16 1000  BP: 135/86 (!) 144/84  Pulse:    Resp: 18 18  Temp: 98.5 F (36.9 C) 97.9 F (36.6 C)    General: alert and awake, able to follow commands and w/o any physical difficulties. Patient with ongoing expressive aphasia and difficulties finding words. No fever. Denies CP and SOB. Cardiovascular: S1 and S2, no rubs or gallops Respiratory: CTA bilaterally, no use of accessory muscles  Abd: soft, NT, ND, positive BS Extremities: no edema, no cyanosis or clubbing   Discharge Instructions   Discharge Instructions    Diet - low sodium heart healthy    Complete by:  As directed   Discharge instructions    Complete by:  As directed   Take medications as prescribed Please follow up with PCP in 10 days Follow up with cardiology in 1 week (office will set up appointment for  you) Follow low sodium diet and check weight on daily basis  Please stop smoking   Increase activity slowly    Complete by:  As directed     Current Discharge Medication List    START taking these medications   Details  nicotine (NICODERM CQ) 14 mg/24hr patch Place 1 patch (14 mg total) onto the skin daily. Qty: 28 patch, Refills: 0      CONTINUE these medications which have CHANGED   Details  aspirin EC 325 MG EC tablet Take 1 tablet (325 mg total) by mouth daily. Qty: 30 tablet, Refills: 1   Associated Diagnoses: Cerebrovascular accident (CVA) due to thrombosis of cerebral artery (HCC)      CONTINUE these medications which have NOT CHANGED   Details  atorvastatin (LIPITOR) 20 MG tablet Take 1 tablet (20 mg total) by mouth daily. Qty: 90 tablet, Refills: 2   Associated Diagnoses: Hyperlipidemia LDL goal <70; Cerebrovascular accident (CVA) due to thrombosis of cerebral artery (HCC)    canagliflozin (INVOKANA) 100 MG TABS tablet Take 1 tablet (100 mg total) by mouth daily before breakfast. Qty: 30 tablet, Refills: 2   Associated Diagnoses: Diabetes mellitus type 2, insulin dependent (HCC)    carvedilol (COREG) 12.5 MG tablet Take 12.5 mg by mouth 2 (two) times daily with a meal.    furosemide (LASIX) 80 MG tablet TAKE 1 TABLET BY MOUTH DAILY Qty: 30 tablet, Refills: 3    gabapentin (NEURONTIN) 400 MG capsule Take 1 capsule (400 mg  total) by mouth 2 (two) times daily. Qty: 180 capsule, Refills: 2   Associated Diagnoses: Diabetes mellitus type 2, insulin dependent (HCC)    Insulin Glargine (LANTUS SOLOSTAR) 100 UNIT/ML Solostar Pen Inject 50 Units into the skin daily at 10 pm. Qty: 5 pen, Refills: PRN   Associated Diagnoses: Diabetes mellitus type 2, insulin dependent (HCC)    JENTADUETO XR 2.10-998 MG TB24 TAKE 1 TABLET BY MOUTH TWICE DAILY Qty: 60 tablet, Refills: 2    lisinopril (PRINIVIL,ZESTRIL) 40 MG tablet Take 1 tablet (40 mg total) by mouth daily. Qty: 90 tablet,  Refills: 2   Associated Diagnoses: Chronic systolic heart failure (HCC); Essential hypertension    NIFEdipine (PROCARDIA-XL/ADALAT CC) 60 MG 24 hr tablet Take 1 tablet (60 mg total) by mouth daily. Qty: 90 tablet, Refills: 2   Associated Diagnoses: Chronic systolic heart failure (HCC); Essential hypertension    nitroGLYCERIN (NITROSTAT) 0.4 MG SL tablet Place 1 tablet (0.4 mg total) under the tongue every 5 (five) minutes x 3 doses as needed for chest pain. Qty: 25 tablet, Refills: 2    potassium chloride SA (K-DUR,KLOR-CON) 20 MEQ tablet TAKE 1 TABLET BY MOUTH DAILY Qty: 30 tablet, Refills: 3    Blood Glucose Monitoring Suppl (ACCU-CHEK AVIVA) device Use as instructed Qty: 1 each, Refills: 0   Associated Diagnoses: Diabetes mellitus type 2, insulin dependent (HCC)       Allergies  Allergen Reactions  . Pravastatin Other (See Comments)    PT CANNOT NOT WALK OR MOVE.   Follow-up Information    Joni ReiningKathryn Lawrence, NP Follow up on 02/20/2016.   Specialties:  Nurse Practitioner, Radiology, Cardiology Why:  at 2:00 pm ([facility]) Contact information: [address] [place] [address] [phone]        Elige RadonJoshua A Dettinger, MD. Schedule an appointment as soon as possible for a visit in 10 day(s).   Specialties:  Family Medicine, Cardiology Contact information: [address] [place] [address] [phone]           The results of significant diagnostics from this hospitalization (including imaging, microbiology, ancillary and laboratory) are listed below for reference.    Significant Diagnostic Studies: Ct Head Wo Contrast  Result Date: 02/05/2016 CLINICAL DATA:  Patient with right-sided weakness and right-sided facial droop. Code stroke. EXAM: CT HEAD WITHOUT CONTRAST TECHNIQUE: Contiguous axial images were obtained from the base of the skull through the vertex without intravenous contrast. COMPARISON:  Brain MRI 10/01/2015; brain CT 04/08/2015 FINDINGS:  Periventricular and subcortical white matter hypodensity compatible with chronic microvascular ischemic changes. Multiple old bilateral basal ganglia lacunar infarcts, left-greater-than-right. Re- demonstrated area of encephalomalacia within the left parietal lobe. More centrally within this region is a new focal area of increased attenuation measuring 11 x 12 mm (image 17; series 2) most compatible with mineralization when compared with prior MRI. No significant mass effect. Orbits are unremarkable. Paranasal sinuses are well aerated. Mastoid air cells unremarkable. Calvarium is intact. IMPRESSION: No acute intracranial process. High attenuation within the chronic left parietal lobe infarct is favored represent mineralization. Critical Value/emergent results were called by telephone at the time of interpretation on 02/05/2016 at 5:52 pm to Dr. Loren RacerAVID YELVERTON , who verbally acknowledged these results. Electronically Signed   By: Annia Beltrew  Davis M.D.   On: 02/05/2016 18:04   Mr Maxine GlennMra Head Wo Contrast  Result Date: 02/06/2016 CLINICAL DATA:  Right-sided weakness and facial droop. EXAM: MRI HEAD WITHOUT CONTRAST MRA HEAD WITHOUT CONTRAST TECHNIQUE: Multiplanar, multiecho pulse  sequences of the brain and surrounding structures were obtained without intravenous contrast. Angiographic images of the head were obtained using MRA technique without contrast. COMPARISON:  10/01/2015 FINDINGS: Intermittently motion degraded MRI HEAD FINDINGS Calvarium and upper cervical spine: No focal marrow signal abnormality. Orbits: Negative. Sinuses and Mastoids: Chronic bilateral mastoid fluid with negative nasopharynx. Brain: Moderate area of cortically based restricted diffusion in the left posterior temporal and parietal cortex. This is in the MCA distribution. The acute infarct neighbors a remote infarct more posteriorly, which shows intrinsic T1 hyperintensity from the mineralization. Accounting for this there is no hemorrhagic  conversion of the acute infarct. There is chronic white matter disease. Remote perforator infarct in the left basal ganglia affecting caudate, putamen, and internal capsule. Remote lacunar infarcts in the bilateral thalamus. There is associated wallerian degeneration seen in the left cerebral peduncle. The brainstem is atrophic. MRA HEAD FINDINGS Symmetric carotid arteries. Atheromatous type irregularity of the bilateral carotid siphons without flow limiting stenosis. After the anterior temporal branch, there is chronic right M1 occlusion. Associated collaterals from the anterior cerebral territory seen over the right frontal convexity. Collateral flow seen within the distal right MCA branches at the upper sylvian fissure. Atheromatous narrowing seen in the bilateral ACA and left MCA distribution, including chronic high-grade stenosis of a left M2 insular branch. No progression compared to prior. Mild left vertebral artery dominance. Inferior cerebellar arteries are not well visualized. Poor flow in left PCA branches beyond the P3 segment, stable. Mild right P2 segment narrowing with improved MR appearance. IMPRESSION: 1. Moderate-sized left MCA territory acute infarct affecting the posterior temporal and parietal cortex. This neighbors a remote left parietal infarct. No hemorrhagic conversion. 2. Remote left perforator infarcts with left corticospinal wallerian degeneration. 3. No acute arterial finding. 4. 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 5. Diffuse intracranial atherosclerosis. No evidence of progressive stenosis compared to 10/01/2015. Electronically Signed   By: Marnee Spring M.D.   On: 02/06/2016 10:14   Mr Brain Wo Contrast  Result Date: 02/06/2016 CLINICAL DATA:  Right-sided weakness and facial droop. EXAM: MRI HEAD WITHOUT CONTRAST MRA HEAD WITHOUT CONTRAST TECHNIQUE: Multiplanar, multiecho pulse sequences of the brain and surrounding structures were obtained without  intravenous contrast. Angiographic images of the head were obtained using MRA technique without contrast. COMPARISON:  10/01/2015 FINDINGS: Intermittently motion degraded MRI HEAD FINDINGS Calvarium and upper cervical spine: No focal marrow signal abnormality. Orbits: Negative. Sinuses and Mastoids: Chronic bilateral mastoid fluid with negative nasopharynx. Brain: Moderate area of cortically based restricted diffusion in the left posterior temporal and parietal cortex. This is in the MCA distribution. The acute infarct neighbors a remote infarct more posteriorly, which shows intrinsic T1 hyperintensity from the mineralization. Accounting for this there is no hemorrhagic conversion of the acute infarct. There is chronic white matter disease. Remote perforator infarct in the left basal ganglia affecting caudate, putamen, and internal capsule. Remote lacunar infarcts in the bilateral thalamus. There is associated wallerian degeneration seen in the left cerebral peduncle. The brainstem is atrophic. MRA HEAD FINDINGS Symmetric carotid arteries. Atheromatous type irregularity of the bilateral carotid siphons without flow limiting stenosis. After the anterior temporal branch, there is chronic right M1 occlusion. Associated collaterals from the anterior cerebral territory seen over the right frontal convexity. Collateral flow seen within the distal right MCA branches at the upper sylvian fissure. Atheromatous narrowing seen in the bilateral ACA and left MCA distribution, including chronic high-grade stenosis of a left M2 insular branch. No progression compared  to prior. Mild left vertebral artery dominance. Inferior cerebellar arteries are not well visualized. Poor flow in left PCA branches beyond the P3 segment, stable. Mild right P2 segment narrowing with improved MR appearance. IMPRESSION: 1. Moderate-sized left MCA territory acute infarct affecting the posterior temporal and parietal cortex. This neighbors a remote left  parietal infarct. No hemorrhagic conversion. 2. Remote left perforator infarcts with left corticospinal wallerian degeneration. 3. No acute arterial finding. 4. 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 5. Diffuse intracranial atherosclerosis. No evidence of progressive stenosis compared to 10/01/2015. Electronically Signed   By: Marnee Spring M.D.   On: 02/06/2016 10:14    Microbiology: No results found for this or any previous visit (from the past 240 hour(s)).   Labs: Basic Metabolic Panel:  Recent Labs Lab 02/05/16 1715 02/05/16 1723  NA 128* 137  K 3.6 3.8  CL 96* 97*  CO2 23  --   GLUCOSE 234* 232*  BUN 15 14  CREATININE 1.19 1.00  CALCIUM 8.4*  --    Liver Function Tests:  Recent Labs Lab 02/05/16 1715  AST 22  ALT 21  ALKPHOS 79  BILITOT 0.6  PROT 7.5  ALBUMIN 4.2   CBC:  Recent Labs Lab 02/05/16 1715 02/05/16 1723  WBC 7.2  --   NEUTROABS 4.5  --   HGB 16.6 16.3  HCT 46.9 48.0  MCV 87.5  --   PLT 162  --    CBG:  Recent Labs Lab 02/07/16 0837 02/07/16 1158 02/07/16 1617 02/07/16 2047 02/08/16 0727  GLUCAP 175* 163* 133* 122* 108*    Signed:  Vassie Loll MD.  [facility] 02/08/2016, 11:26 AM

## 2016-02-08 NOTE — Progress Notes (Signed)
Patient discharged home with home health. Patient sent home with IV removed.  Patient sent home with belongings and prescriptions.

## 2016-02-10 LAB — GLUCOSE, CAPILLARY: Glucose-Capillary: 223 mg/dL — ABNORMAL HIGH (ref 65–99)

## 2016-02-20 ENCOUNTER — Encounter: Payer: Self-pay | Admitting: Adult Health

## 2016-02-20 ENCOUNTER — Encounter (INDEPENDENT_AMBULATORY_CARE_PROVIDER_SITE_OTHER): Payer: Self-pay

## 2016-02-20 ENCOUNTER — Telehealth: Payer: Self-pay | Admitting: Family Medicine

## 2016-02-20 ENCOUNTER — Ambulatory Visit (INDEPENDENT_AMBULATORY_CARE_PROVIDER_SITE_OTHER): Payer: Medicaid Other | Admitting: Adult Health

## 2016-02-20 ENCOUNTER — Ambulatory Visit (INDEPENDENT_AMBULATORY_CARE_PROVIDER_SITE_OTHER): Payer: Medicaid Other | Admitting: *Deleted

## 2016-02-20 VITALS — BP 110/80 | HR 84 | Ht 72.0 in | Wt 221.0 lb

## 2016-02-20 DIAGNOSIS — I5022 Chronic systolic (congestive) heart failure: Secondary | ICD-10-CM | POA: Diagnosis not present

## 2016-02-20 DIAGNOSIS — I213 ST elevation (STEMI) myocardial infarction of unspecified site: Secondary | ICD-10-CM

## 2016-02-20 DIAGNOSIS — I513 Intracardiac thrombosis, not elsewhere classified: Secondary | ICD-10-CM

## 2016-02-20 DIAGNOSIS — I2510 Atherosclerotic heart disease of native coronary artery without angina pectoris: Secondary | ICD-10-CM

## 2016-02-20 DIAGNOSIS — I255 Ischemic cardiomyopathy: Secondary | ICD-10-CM | POA: Diagnosis not present

## 2016-02-20 MED ORDER — RIVAROXABAN 20 MG PO TABS
20.0000 mg | ORAL_TABLET | Freq: Every day | ORAL | 11 refills | Status: DC
Start: 2016-02-20 — End: 2016-11-04

## 2016-02-20 NOTE — Patient Instructions (Addendum)
Your physician recommends that you schedule a follow-up appointment in: 3 Months in [place].  Your physician recommends that you schedule a follow-up appointment in: [place] with Vashti HeyLisa Reid, RN  Your physician has recommended you make the following change in your medication: Start Xarelto 20 mg Daily   Stop Taking Aspirin   You have been given Samples of Xarelto Today  If you need a refill on your cardiac medications before your next appointment, please call your pharmacy.  Thank you for choosing [facility]!

## 2016-02-20 NOTE — Progress Notes (Signed)
Cardiology Office Note   Date:  02/20/2016   ID:  Edwin White, DOB October 06, 1965, MRN [id number]  PCP:  Nils PyleJoshua A Dettinger, MD  Cardiologist:  Edwin White/ Edwin Feher, NP   Chief Complaint  Patient presents with  . Cerebrovascular Accident  . Coronary Artery Disease      History of Present Illness: Edwin White is a 50 y.o. male who presents for ongoing assessment and management of chronic systolic CHF with ischemic cardiomyopathy, coronary artery disease status post coronary artery bypass grafting in 2007, history of medical noncompliance, moderate mitral regurgitation by prior echo, with other history to include CVA, type 2 diabetes, and GERD.  He was last seen in the office by Dr. Purvis White on 05/05/2015. The patient was symptomatically stable, metoprolol was changed to carvedilol 12.5 mg twice a day. Lipitor was started at 20 mg daily. The patient was not taking Lasix due to frequent urination,and stopped ACE inhibitor on their own. Nifedipine was increased to 60 mg daily.  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 Echocardiogram demonstrated left ventricular: Apical thrombus. The patient was not started on ation therapy for one week to decrease chances of hemorrhagic transformation (high-risk for this because of size of infarct).  2-D echocardiogram revealed severe LV dysfunction, EF 10-15% and grade 2 diastolic dysfunction. Also new apical thrombus. He is here today to discuss Coumadin initiation. Also consideration for ICD and medication adjustments.of note during hospitalization the patient refused duplex examination of carotid arteries.   Left ventricle: The cavity size was moderately dilated. Wall thickness was normal. Systolic function was severely reduced. The estimated ejection fraction was in the range of 10% to 15%. Features are  consistent with a pseudonormal left ventricular filling pattern, with concomitant abnormal relaxation and increased filling pressure (grade 2 diastolic dysfunction). Doppler parameters are consistent with high ventricular filling pressure. - Aortic valve: Valve area (VTI): 2.08 cm^2. Valve area (Vmax): 1.94 cm^2. - Left atrium: The atrium was moderately dilated. - Technically adequate study. Echocontrast was used to enhance visualization. - There is a 1.4 x 1.1 cm apical thrombus.  He comes today to have follow up. He is without complaints. He is dysphasic. He is more compliant with medications since being at home. He continues to undergo PT.   Past Medical History:  Diagnosis Date  . CHF (congestive heart failure) (HCC)    No recurrent admissions for heart failure.  . Coronary artery disease    Status post coronary bypass grafting in 2007  . Crescendo transient ischemic attacks   . Functional bowel disorder    Bloating and abdominal cramping as well as constipation requiring lubiprostone  . Gastroparesis    Gastric emptying study scheduled  . GERD (gastroesophageal reflux disease)   . Heart attack [facility](HCC) September 13, 2004   Followed by bypass surgery  . History of medication noncompliance   . Hyperlipidemia   . Hypertension    Poorly controlled  . Ischemic cardiomyopathy    Ejection fraction improved from 15%-20% to 40%-45% in 2012 by echocardiogram  . Moderate mitral regurgitation by prior echocardiogram   . NSTEMI (non-ST elevated myocardial infarction) (HCC) 12/31/2013  . Post-infarction apical thrombus (HCC)    Anticoagulation discontinued  . Stroke [facility](HCC) 04/2012   denies residual on 12/31/2013  . Type 2 diabetes mellitus (HCC)     Past Surgical History:  Procedure Laterality Date  .  CORONARY ANGIOPLASTY WITH STENT PLACEMENT  2006  . CORONARY ARTERY BYPASS GRAFT  2007   "CABG X5"  . LEFT HEART CATHETERIZATION WITH CORONARY/GRAFT ANGIOGRAM N/A 01/04/2014    Procedure: LEFT HEART CATHETERIZATION WITH Isabel Caprice;  Surgeon: Kathleene Hazel, MD;  Location[facility] CATH LAB;  Service: Cardiovascular;  Laterality: N/A;  . PERCUTANEOUS CORONARY STENT INTERVENTION (PCI-S)  01/04/2014   Procedure: PERCUTANEOUS CORONARY STENT INTERVENTION (PCI-S);  Surgeon: Kathleene Hazel, MD;  Location: [facility] CATH LAB;  Service: Cardiovascular;;     Current Outpatient Prescriptions  Medication Sig Dispense Refill  . atorvastatin (LIPITOR) 20 MG tablet Take 1 tablet (20 mg total) by mouth daily. 90 tablet 2  . Blood Glucose Monitoring Suppl (ACCU-CHEK AVIVA) device Use as instructed 1 each 0  . canagliflozin (INVOKANA) 100 MG TABS tablet Take 1 tablet (100 mg total) by mouth daily before breakfast. 30 tablet 2  . carvedilol (COREG) 12.5 MG tablet Take 12.5 mg by mouth 2 (two) times daily with a meal.    . furosemide (LASIX) 80 MG tablet TAKE 1 TABLET BY MOUTH DAILY 30 tablet 3  . gabapentin (NEURONTIN) 400 MG capsule Take 1 capsule (400 mg total) by mouth 2 (two) times daily. 180 capsule 2  . Insulin Glargine (LANTUS SOLOSTAR) 100 UNIT/ML Solostar Pen Inject 50 Units into the skin daily at 10 pm. 5 pen PRN  . JENTADUETO XR 2.10-998 MG TB24 TAKE 1 TABLET BY MOUTH TWICE DAILY 60 tablet 2  . lisinopril (PRINIVIL,ZESTRIL) 40 MG tablet Take 1 tablet (40 mg total) by mouth daily. 90 tablet 2  . nicotine (NICODERM CQ) 14 mg/24hr patch Place 1 patch (14 mg total) onto the skin daily. 28 patch 0  . NIFEdipine (PROCARDIA-XL/ADALAT CC) 60 MG 24 hr tablet Take 1 tablet (60 mg total) by mouth daily. 90 tablet 2  . nitroGLYCERIN (NITROSTAT) 0.4 MG SL tablet Place 1 tablet (0.4 mg total) under the tongue every 5 (five) minutes x 3 doses as needed for chest pain. 25 tablet 2  . potassium chloride SA (K-DUR,KLOR-CON) 20 MEQ tablet TAKE 1 TABLET BY MOUTH DAILY 30 tablet 3  . rivaroxaban (XARELTO) 20 MG TABS tablet Take 1 tablet (20 mg total) by mouth daily with supper. 30  tablet 11   No current facility-administered medications for this visit.     Allergies:   Pravastatin    Social History:  The patient  reports that he has been smoking Cigarettes.  He started smoking about 36 years ago. He has a 36.00 pack-year smoking history. He has never used smokeless tobacco. He reports that he does not drink alcohol or use drugs.   Family History:  The patient's family history includes Cancer in his mother; Coronary artery disease in his father. He was adopted.    ROS: All other systems are reviewed and negative. Unless otherwise mentioned in H&P    PHYSICAL EXAM: VS:  BP 110/80   Pulse 84   Ht 6' (1.829 m)   Wt 221 lb (100.2 kg)   SpO2 97%   BMI 29.97 kg/m  , BMI Body mass index is 29.97 kg/m. GEN: Well nourished, well developed, in no acute distress  HEENT: normal  Neck: no JVD, carotid bruits, or masses Cardiac: RRR; 1/6 systolic murmur, rubs, or gallops,no edema  Respiratory:  Clear to auscultation bilaterally, normal work of breathing GI: soft, nontender, nondistended, + BS MS: no deformity or atrophy  Skin: warm and dry, no rash Neuro:  Strength and sensation  are intact Dysphasic with right sides hemiparesis.  Psych: euthymic mood, full affect  Recent Labs: 02/05/2016: ALT 21; BUN 14; Creatinine, Ser 1.00; Hemoglobin 16.3; Platelets 162; Potassium 3.8; Sodium 137    Lipid Panel    Component Value Date/Time   CHOL 222 (H) 02/06/2016 0557   CHOL 211 (H) 01/09/2016 1339   TRIG 200 (H) 02/06/2016 0557   HDL 33 (L) 02/06/2016 0557   HDL 38 (L) 01/09/2016 1339   CHOLHDL 6.7 02/06/2016 0557   VLDL 40 02/06/2016 0557   LDLCALC 149 (H) 02/06/2016 0557   LDLCALC 139 (H) 01/09/2016 1339      Wt Readings from Last 3 Encounters:  02/20/16 221 lb (100.2 kg)  02/07/16 227 lb 8.2 oz (103.2 kg)  01/09/16 229 lb 3.2 oz (104 kg)        ASSESSMENT AND PLAN:  1. Apical thrombus: He is not ready to begin anticoagulation. Due to hx of  non-compliance, transportation issues, and cost, he will be placed on Xarelto 20 mg daily. He will be seen in the [place] office for follow up appointment with coumadin clinic. At that time a BMET will be ordered. I have explained to him the risks and benefits of anticoagulation therapy and side effects of bleeding and bruising. His sisters is his caretaker and verbalizes understanding. He will have close followup in [place] in one month.   2. Ischemic Cardiomyopathy: Most recent echo demonstrated EF of 10-15%. He will need close follow up and discussion for ICD on next appointment. He will continue lasix, carvedilol, and ACE. Would stop procardia on next office visit with reduced EF.   3. Hx CAD: Severe triple vessel disease s/p CABG with 2/3 patent grafts per cath 01/04/2014. PTCA with BMS to mid Cx.    Current medicines are reviewed at length with the patient today.    Labs/ tests ordered today include: Needs BMET on follow up.  No orders of the defined types were placed in this encounter.    Disposition:   FU with one month.   Signed, Joni ReiningKathryn Lodie Waheed, NP  02/20/2016 5:24 PM    [facility] [address], [place], [place] [address] Phone: [phone]; Fax: [phone]

## 2016-02-20 NOTE — Progress Notes (Signed)
Pt was started on Xarelto 20mg  daily for CVA, apical thrombus on 02/21/16 by Joni ReiningKathryn Lawrence FNP.  Reviewed patients medication list.  Pt is not currently on any combined P-gp and strong CYP3A4 inhibitors/inducers (ketoconazole, traconazole, ritonavir, carbamazepine, phenytoin, rifampin, St. John's wort).  Reviewed labs from 02/05/16.  SCr 1.19, Weight 103.2kg, CrCl 105.9.  Dose is appropriate based on CrCl.   Hgb and HCT: 16.6/46.9  A full discussion of the nature of anticoagulants has been carried out.  A benefit/risk analysis has been presented to the patient, so that they understand the justification for choosing anticoagulation with Xarelto at this time.  The need for compliance is stressed.  Pt is aware to take the medication once daily with the largest meal of the day.  Side effects of potential bleeding are discussed, including unusual colored urine or stools, coughing up blood or coffee ground emesis, nose bleeds or serious fall or head trauma.  Discussed signs and symptoms of stroke. The patient should avoid any OTC items containing aspirin or ibuprofen.  Avoid alcohol consumption.   Call if any signs of abnormal bleeding.  Discussed financial obligations and resolved any difficulty in obtaining medication.  Next lab test test in 1 month.   [facility].

## 2016-02-20 NOTE — Progress Notes (Signed)
Name: Edwin White    DOB: 07-26-1965  Age: 50 y.o.  MR#: [id number]       PCP:  Elige Radon Dettinger, MD      Insurance: Payor: MEDICAID [place] / Plan: MEDICAID [place] ACCESS / Product Type: *No Product type* /   CC:   No chief complaint on file.   VS Vitals:   02/20/16 1523  Pulse: 84  SpO2: 97%  Weight: 221 lb (100.2 kg)  Height: 6' (1.829 m)    Weights Current Weight  02/20/16 221 lb (100.2 kg)  02/07/16 227 lb 8.2 oz (103.2 kg)  01/09/16 229 lb 3.2 oz (104 kg)    Blood Pressure  BP Readings from Last 3 Encounters:  02/08/16 (!) 144/84  01/09/16 124/78  11/04/15 118/79     Admit date:  (Not on file) Last encounter with RMR:  Visit date not found   Allergy Pravastatin  Current Outpatient Prescriptions  Medication Sig Dispense Refill  . aspirin EC 325 MG EC tablet Take 1 tablet (325 mg total) by mouth daily. 30 tablet 1  . atorvastatin (LIPITOR) 20 MG tablet Take 1 tablet (20 mg total) by mouth daily. 90 tablet 2  . Blood Glucose Monitoring Suppl (ACCU-CHEK AVIVA) device Use as instructed 1 each 0  . canagliflozin (INVOKANA) 100 MG TABS tablet Take 1 tablet (100 mg total) by mouth daily before breakfast. 30 tablet 2  . carvedilol (COREG) 12.5 MG tablet Take 12.5 mg by mouth 2 (two) times daily with a meal.    . furosemide (LASIX) 80 MG tablet TAKE 1 TABLET BY MOUTH DAILY 30 tablet 3  . gabapentin (NEURONTIN) 400 MG capsule Take 1 capsule (400 mg total) by mouth 2 (two) times daily. 180 capsule 2  . Insulin Glargine (LANTUS SOLOSTAR) 100 UNIT/ML Solostar Pen Inject 50 Units into the skin daily at 10 pm. 5 pen PRN  . JENTADUETO XR 2.10-998 MG TB24 TAKE 1 TABLET BY MOUTH TWICE DAILY 60 tablet 2  . lisinopril (PRINIVIL,ZESTRIL) 40 MG tablet Take 1 tablet (40 mg total) by mouth daily. 90 tablet 2  . nicotine (NICODERM CQ) 14 mg/24hr patch Place 1 patch (14 mg total) onto the skin daily. 28 patch 0  . NIFEdipine (PROCARDIA-XL/ADALAT CC) 60 MG 24 hr tablet Take 1 tablet (60 mg  total) by mouth daily. 90 tablet 2  . nitroGLYCERIN (NITROSTAT) 0.4 MG SL tablet Place 1 tablet (0.4 mg total) under the tongue every 5 (five) minutes x 3 doses as needed for chest pain. 25 tablet 2  . potassium chloride SA (K-DUR,KLOR-CON) 20 MEQ tablet TAKE 1 TABLET BY MOUTH DAILY 30 tablet 3   No current facility-administered medications for this visit.     Discontinued Meds:   There are no discontinued medications.  Patient Active Problem List   Diagnosis Date Noted  . Apical mural thrombus (HCC)   . Tobacco abuse   . CVA (cerebral infarction) 02/05/2016  . Cerebrovascular accident (CVA) due to thrombosis of cerebral artery (HCC) 02/05/2016  . 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 Type 2 diabetes mellitus with hyperglycemia, with long-term current use of insulin (HCC) 07/13/2015  . HLD (hyperlipidemia) 07/13/2015  . CVA (cerebral vascular accident) (HCC) 07/13/2015  . NSTEMI (non-ST elevated myocardial infarction) (HCC) 12/31/2013  . Hypokalemia 04/21/2012  . Thrombocytopenia (HCC) 04/20/2012  . Gastroparesis   . Ischemic cardiomyopathy   . Coronary artery disease   . Hypertension   .  NONDEPENDENT TOBACCO USE DISORDER 05/24/2010  . 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 Chronic systolic congestive heart failure (HCC) 05/24/2010  . PVD WITH CLAUDICATION 05/24/2010  . CHRONIC AIRWAY OBSTRUCTION NEC 05/24/2010  . ESOPHAGEAL REFLUX 05/24/2010  . POSTSURGICAL AORTOCORONARY BYPASS STATUS 05/24/2010    LABS    Component Value Date/Time   NA 137 02/05/2016 1723   NA 128 (L) 02/05/2016 1715   NA 136 01/09/2016 1339   NA 138 07/13/2015 1052   NA 136 11/21/2014 1437   K 3.8 02/05/2016 1723   K 3.6 02/05/2016 1715   K 4.1 01/09/2016 1339   CL 97 (L) 02/05/2016 1723   CL 96 (L) 02/05/2016 1715   CL 94 (L) 01/09/2016 1339   CO2 23 02/05/2016 1715   CO2 25 01/09/2016 1339   CO2 23 07/13/2015 1052   GLUCOSE 232 (H)  02/05/2016 1723   GLUCOSE 234 (H) 02/05/2016 1715   GLUCOSE 252 (H) 01/09/2016 1339   GLUCOSE 296 (H) 07/13/2015 1052   GLUCOSE 228 (H) 11/21/2014 1437   BUN 14 02/05/2016 1723   BUN 15 02/05/2016 1715   BUN 16 01/09/2016 1339   BUN 11 07/13/2015 1052   BUN 10 11/21/2014 1437   CREATININE 1.00 02/05/2016 1723   CREATININE 1.19 02/05/2016 1715   CREATININE 1.13 01/09/2016 1339   CALCIUM 8.4 (L) 02/05/2016 1715   CALCIUM 9.9 01/09/2016 1339   CALCIUM 9.8 07/13/2015 1052   GFRNONAA >60 02/05/2016 1715   GFRNONAA 75 01/09/2016 1339   GFRNONAA 88 07/13/2015 1052   GFRAA >60 02/05/2016 1715   GFRAA 87 01/09/2016 1339   GFRAA 102 07/13/2015 1052   CMP     Component Value Date/Time   NA 137 02/05/2016 1723   NA 136 01/09/2016 1339   K 3.8 02/05/2016 1723   CL 97 (L) 02/05/2016 1723   CO2 23 02/05/2016 1715   GLUCOSE 232 (H) 02/05/2016 1723   BUN 14 02/05/2016 1723   BUN 16 01/09/2016 1339   CREATININE 1.00 02/05/2016 1723   CALCIUM 8.4 (L) 02/05/2016 1715   PROT 7.5 02/05/2016 1715   PROT 7.4 01/09/2016 1339   ALBUMIN 4.2 02/05/2016 1715   ALBUMIN 4.8 01/09/2016 1339   AST 22 02/05/2016 1715   ALT 21 02/05/2016 1715   ALKPHOS 79 02/05/2016 1715   BILITOT 0.6 02/05/2016 1715   BILITOT 0.4 01/09/2016 1339   GFRNONAA >60 02/05/2016 1715   GFRAA >60 02/05/2016 1715       Component Value Date/Time   WBC 7.2 02/05/2016 1715   WBC 7.8 11/21/2014 1437   WBC 8.0 01/05/2014 0510   HGB 16.3 02/05/2016 1723   HGB 16.6 02/05/2016 1715   HGB 14.5 11/21/2014 1437   HCT 48.0 02/05/2016 1723   HCT 46.9 02/05/2016 1715   HCT 41.8 11/21/2014 1437   MCV 87.5 02/05/2016 1715   MCV 88.4 11/21/2014 1437   MCV 89.1 01/05/2014 0510    Lipid Panel     Component Value Date/Time   CHOL 222 (H) 02/06/2016 0557   CHOL 211 (H) 01/09/2016 1339   TRIG 200 (H) 02/06/2016 0557   HDL 33 (L) 02/06/2016 0557   HDL 38 (L) 01/09/2016 1339   CHOLHDL 6.7 02/06/2016 0557   VLDL 40 02/06/2016  0557   LDLCALC 149 (H) 02/06/2016 0557   LDLCALC 139 (H) 01/09/2016 1339    ABG    Component Value Date/Time   TCO2 23 02/05/2016 1723     No results found for:  TSH BNP (last 3 results) No results for input(s): BNP in the last 8760 hours.  ProBNP (last 3 results) No results for input(s): PROBNP in the last 8760 hours.  Cardiac Panel (last 3 results) No results for input(s): CKTOTAL, CKMB, TROPONINI, RELINDX in the last 72 hours.  Iron/TIBC/Ferritin/ %Sat No results found for: IRON, TIBC, FERRITIN, IRONPCTSAT   EKG Orders placed or performed during the hospital encounter of 02/05/16  . ED EKG  . ED EKG  . EKG 12-Lead  . EKG 12-Lead  . EKG     Prior Assessment and Plan Problem List as of 02/20/2016 Reviewed: 01/09/2016  1:00 PM by Elige Radon Dettinger, MD     Cardiovascular and Mediastinum   Old myocardial infarction   Chronic systolic congestive heart failure [facility])   Last Assessment & Plan 01/12/2014 Office Visit Written 01/12/2014  2:01 PM by Jodelle Gross, NP    The patient appears to be fluid overloaded with the significant lower extremity edema, but his lung sounds did not appear to be a congested without wheezes. He will begin taking his Lasix as directed along with potassium, I have given him a new prescription for this. He will need to pick it up on the way home. I have counseled him strongly concerning medical compliance. 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 ABIs have been done last year which showed no significant peripheral vascular disease.      POSTSURGICAL AORTOCORONARY BYPASS STATUS   Ischemic cardiomyopathy   Last Assessment & Plan 01/12/2014 Office Visit Written 01/12/2014  2:02 PM by Jodelle Gross, NP    I placed a life vest on this patient. I have explained its use, and necessity. [employer] representative will be contacting him for placement of vest. He will restart ACE inhibitor Lasix continue carvedilol and adhere to a low-sodium diet.      Coronary artery disease   Hypertension   Last Assessment & Plan 09/02/2015 Office Visit Written 09/02/2015 10:26 AM by Elige Radon Dettinger, MD    Controlled currently continue current medications      NSTEMI (non-ST elevated myocardial infarction) (HCC)   CVA (cerebral vascular accident) (HCC)   Cerebrovascular accident (CVA) due to thrombosis of cerebral artery (HCC)   Apical mural thrombus (HCC)     Respiratory   CHRONIC AIRWAY OBSTRUCTION NEC     Digestive   ESOPHAGEAL REFLUX   Gastroparesis     Endocrine   Type 2 diabetes mellitus with hyperglycemia, with long-term current use of insulin [facility])   Last Assessment & Plan 09/02/2015 Office Visit Written 09/02/2015 10:26 AM by Elige Radon Dettinger, MD    Blood sugar still running high says he did not tolerate the leg lift and metformin combo initially, we will try again instructed that the diarrhea will resolve after the first couple weeks. Will call me if he still does not tolerate. 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 Code stroke. EXAM: CT HEAD WITHOUT CONTRAST TECHNIQUE: Contiguous axial images were obtained from the base of the skull through the vertex without intravenous contrast. COMPARISON:  Brain MRI 10/01/2015; brain CT 04/08/2015 FINDINGS: Periventricular and subcortical white matter  hypodensity compatible with chronic microvascular ischemic changes. Multiple old bilateral basal ganglia lacunar infarcts, left-greater-than-right. Re- demonstrated area of encephalomalacia within the left parietal lobe. More centrally within this region is a new focal area of increased attenuation measuring 11 x 12 mm (image 17; series 2) most compatible with mineralization when compared with prior MRI. No significant mass effect. Orbits are unremarkable. Paranasal sinuses are well aerated. Mastoid air cells unremarkable. Calvarium is intact. IMPRESSION: No acute intracranial process. High attenuation within the chronic left parietal lobe infarct is favored represent mineralization. Critical Value/emergent results were called by telephone at the time of interpretation on 02/05/2016 at 5:52 pm to Dr. Loren RacerAVID YELVERTON , who verbally acknowledged these results. Electronically Signed   By: Annia Beltrew  Davis M.D.   On: 02/05/2016 18:04   Mr Maxine GlennMra Head Wo Contrast  Result Date: 02/06/2016 CLINICAL DATA:  Right-sided weakness and facial droop. EXAM: MRI HEAD WITHOUT CONTRAST MRA HEAD WITHOUT CONTRAST TECHNIQUE: Multiplanar, multiecho pulse sequences of the brain and surrounding structures were obtained without intravenous contrast. Angiographic images of the head were obtained using MRA technique without contrast. COMPARISON:  10/01/2015 FINDINGS: Intermittently motion degraded MRI HEAD FINDINGS Calvarium and upper cervical spine: No focal marrow signal abnormality. Orbits: Negative. Sinuses and Mastoids: Chronic bilateral mastoid fluid with negative nasopharynx. Brain: Moderate area of cortically based restricted diffusion in the left posterior temporal and parietal cortex. This is in the MCA distribution. The acute infarct neighbors a remote infarct more posteriorly, which shows intrinsic T1 hyperintensity from the mineralization. Accounting for this there is no hemorrhagic conversion of the acute infarct. There is chronic  white matter disease. Remote perforator infarct in the left basal ganglia affecting caudate, putamen, and internal capsule. Remote lacunar infarcts in the bilateral thalamus. There is associated wallerian degeneration seen in the left cerebral peduncle. The brainstem is atrophic. MRA HEAD FINDINGS Symmetric carotid arteries. Atheromatous type irregularity of the bilateral carotid siphons without flow limiting stenosis. After the anterior temporal branch, there is chronic right M1 occlusion. Associated collaterals from the anterior cerebral territory seen over the right frontal convexity. Collateral flow seen within the distal right MCA branches at the upper sylvian fissure. Atheromatous narrowing seen in the bilateral ACA and left MCA distribution, including chronic high-grade stenosis of a left M2 insular branch. No progression compared to prior. Mild left vertebral artery dominance. Inferior cerebellar arteries are not well visualized. Poor flow in left PCA branches beyond the P3 segment, stable. Mild right P2 segment narrowing with improved MR appearance. IMPRESSION: 1. Moderate-sized left MCA territory acute infarct affecting the posterior temporal and parietal cortex. This neighbors a remote left parietal infarct. No hemorrhagic conversion. 2. Remote left perforator infarcts with left corticospinal wallerian degeneration. 3. No acute arterial finding. 4. 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 5. Diffuse intracranial atherosclerosis. No evidence of progressive stenosis compared to 10/01/2015. Electronically Signed   By: Marnee SpringJonathon  Watts M.D.   On: 02/06/2016 10:14   Mr Brain Wo Contrast  Result Date: 02/06/2016 CLINICAL DATA:  Right-sided weakness and facial droop. EXAM: MRI HEAD WITHOUT CONTRAST  MRA HEAD WITHOUT CONTRAST TECHNIQUE: Multiplanar, multiecho pulse sequences of the brain and surrounding structures were obtained without intravenous contrast. Angiographic images of the head  were obtained using MRA technique without contrast. COMPARISON:  10/01/2015 FINDINGS: Intermittently motion degraded MRI HEAD FINDINGS Calvarium and upper cervical spine: No focal marrow signal abnormality. Orbits: Negative. Sinuses and Mastoids: Chronic bilateral mastoid fluid with negative nasopharynx. Brain: Moderate area of cortically based restricted diffusion in the left posterior temporal and parietal cortex. This is in the MCA distribution. The acute infarct neighbors a remote infarct more posteriorly, which shows intrinsic T1 hyperintensity from the mineralization. Accounting for this there is no hemorrhagic conversion of the acute infarct. There is chronic white matter disease. Remote perforator infarct in the left basal ganglia affecting caudate, putamen, and internal capsule. Remote lacunar infarcts in the bilateral thalamus. There is associated wallerian degeneration seen in the left cerebral peduncle. The brainstem is atrophic. MRA HEAD FINDINGS Symmetric carotid arteries. Atheromatous type irregularity of the bilateral carotid siphons without flow limiting stenosis. After the anterior temporal branch, there is chronic right M1 occlusion. Associated collaterals from the anterior cerebral territory seen over the right frontal convexity. Collateral flow seen within the distal right MCA branches at the upper sylvian fissure. Atheromatous narrowing seen in the bilateral ACA and left MCA distribution, including chronic high-grade stenosis of a left M2 insular branch. No progression compared to prior. Mild left vertebral artery dominance. Inferior cerebellar arteries are not well visualized. Poor flow in left PCA branches beyond the P3 segment, stable. Mild right P2 segment narrowing with improved MR appearance. IMPRESSION: 1. Moderate-sized left MCA territory acute infarct affecting the posterior temporal and parietal cortex. This neighbors a remote left parietal infarct. No hemorrhagic conversion. 2.  Remote left perforator infarcts with left corticospinal wallerian degeneration. 3. No acute arterial finding. 4. 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 5. Diffuse intracranial atherosclerosis. No evidence of progressive stenosis compared to 10/01/2015. Electronically Signed   By: Marnee SpringJonathon  Watts M.D.   On: 02/06/2016 10:14

## 2016-02-21 NOTE — Telephone Encounter (Signed)
Yes we can do that, I think transportation was an issue. Go ahead and place referral for PT and OT under the diagnosis of late effects of stroke

## 2016-02-21 NOTE — Telephone Encounter (Signed)
[facility] would like to see if referral for PT & OT could be placed he is recovering from stroke but that may help more.

## 2016-02-22 NOTE — Telephone Encounter (Signed)
Message routed to our home health nurse for review.

## 2016-02-22 NOTE — Telephone Encounter (Signed)
[facility]

## 2016-02-23 ENCOUNTER — Ambulatory Visit (INDEPENDENT_AMBULATORY_CARE_PROVIDER_SITE_OTHER): Payer: Medicaid Other | Admitting: Family Medicine

## 2016-02-23 ENCOUNTER — Encounter: Payer: Self-pay | Admitting: Family Medicine

## 2016-02-23 VITALS — BP 129/85 | HR 62 | Temp 98.7°F | Ht 72.0 in | Wt 227.4 lb

## 2016-02-23 DIAGNOSIS — I69990 Apraxia following unspecified cerebrovascular disease: Secondary | ICD-10-CM | POA: Diagnosis not present

## 2016-02-23 DIAGNOSIS — I69359 Hemiplegia and hemiparesis following cerebral infarction affecting unspecified side: Secondary | ICD-10-CM

## 2016-02-23 DIAGNOSIS — I69328 Other speech and language deficits following cerebral infarction: Secondary | ICD-10-CM

## 2016-02-23 NOTE — Progress Notes (Signed)
BP 129/85 (BP Location: Left Arm, Patient Position: Sitting, Cuff Size: Large)   Pulse 62   Temp 98.7 F (37.1 C) (Oral)   Ht 6' (1.829 m)   Wt 227 lb 6.4 oz (103.1 kg)   BMI 30.84 kg/m    Subjective:    Patient ID: Edwin Caveavid W White, male    DOB: 10-Aug-1965, 50 y.o.   MRN: [id number]  HPI: Edwin White is a 50 y.o. male presenting on 02/23/2016 for Hospitalization Follow-up   HPI Hospital follow-up for late effects of stroke Patient is coming in for hospital follow-up for a stroke and worsening congestive heart failure. He went into the emergency department on 02/05/2016 through 02/08/2016.He was found to have a new stroke affecting his right side of his body more than it had previously and his speech and language and ability to process speech and language. He is still living at home alone without anybody there to help take care of him. He does not currently have anybody such as a health aide coming to his place. He is still smoking. She has no plans of quitting smoking. He is able to ambulate but does have a lot of difficulty because of his right-sided weakness.  Patient was also diagnosed with worsening congestive heart failure due to a worsening on his echocardiogram in the hospital. It showed his ejection fraction was 10-15%. He has follow-up with cardiology for this and they started him on Xarelto. They are managing this and will follow-up on that. He was recommended to go see a neurologist as well but did not get set up for an appointment for that. He does not have any type of home health care currently. He is not set up for any kind of physical therapy or occupational therapy or speech therapy that he knows of. Today he denies any increased shortness of breath or swelling from baseline. He denies any palpitations or swelling in his feet more than normal.  Relevant past medical, surgical, family and social history reviewed and updated as indicated. Interim medical history since our last  visit reviewed. Allergies and medications reviewed and updated.  Review of Systems  Constitutional: Negative for chills and fever.  Eyes: Negative for discharge.  Respiratory: Negative for cough, chest tightness, shortness of breath and wheezing.   Cardiovascular: Positive for leg swelling (No change from baseline). Negative for chest pain and palpitations.  Musculoskeletal: Positive for gait problem. Negative for back pain.  Skin: Negative for rash.  Neurological: Positive for speech difficulty, weakness and numbness. Negative for dizziness, tremors, seizures, syncope and headaches.  All other systems reviewed and are negative.   Per HPI unless specifically indicated above     Medication List       Accurate as of 02/23/16  2:52 PM. Always use your most recent med list.          ACCU-CHEK AVIVA device Use as instructed   atorvastatin 20 MG tablet Commonly known as:  LIPITOR Take 1 tablet (20 mg total) by mouth daily.   canagliflozin 100 MG Tabs tablet Commonly known as:  INVOKANA Take 1 tablet (100 mg total) by mouth daily before breakfast.   carvedilol 12.5 MG tablet Commonly known as:  COREG Take 12.5 mg by mouth 2 (two) times daily with a meal.   furosemide 80 MG tablet Commonly known as:  LASIX TAKE 1 TABLET BY MOUTH DAILY   gabapentin 400 MG capsule Commonly known as:  NEURONTIN Take 1 capsule (400 mg total) by  mouth 2 (two) times daily.   Insulin Glargine 100 UNIT/ML Solostar Pen Commonly known as:  LANTUS SOLOSTAR Inject 50 Units into the skin daily at 10 pm.   JENTADUETO XR 2.10-998 MG Tb24 Generic drug:  Linagliptin-Metformin HCl ER TAKE 1 TABLET BY MOUTH TWICE DAILY   lisinopril 40 MG tablet Commonly known as:  PRINIVIL,ZESTRIL Take 1 tablet (40 mg total) by mouth daily.   nicotine 14 mg/24hr patch Commonly known as:  NICODERM CQ Place 1 patch (14 mg total) onto the skin daily.   NIFEdipine 60 MG 24 hr tablet Commonly known as:   PROCARDIA-XL/ADALAT CC Take 1 tablet (60 mg total) by mouth daily.   nitroGLYCERIN 0.4 MG SL tablet Commonly known as:  NITROSTAT Place 1 tablet (0.4 mg total) under the tongue every 5 (five) minutes x 3 doses as needed for chest pain.   potassium chloride SA 20 MEQ tablet Commonly known as:  K-DUR,KLOR-CON TAKE 1 TABLET BY MOUTH DAILY   rivaroxaban 20 MG Tabs tablet Commonly known as:  XARELTO Take 1 tablet (20 mg total) by mouth daily with supper.          Objective:    BP 129/85 (BP Location: Left Arm, Patient Position: Sitting, Cuff Size: Large)   Pulse 62   Temp 98.7 F (37.1 C) (Oral)   Ht 6' (1.829 m)   Wt 227 lb 6.4 oz (103.1 kg)   BMI 30.84 kg/m   Wt Readings from Last 3 Encounters:  02/23/16 227 lb 6.4 oz (103.1 kg)  02/20/16 221 lb (100.2 kg)  02/07/16 227 lb 8.2 oz (103.2 kg)    Physical Exam  Constitutional: He is oriented to person, place, and time. He appears well-developed and well-nourished. No distress.  Eyes: Conjunctivae and EOM are normal. Pupils are equal, round, and reactive to light. Right eye exhibits no discharge. No scleral icterus.  Cardiovascular: Normal rate, regular rhythm, normal heart sounds and intact distal pulses.   No murmur heard. Pulmonary/Chest: Effort normal and breath sounds normal. No respiratory distress. He has no decreased breath sounds. He has no wheezes. He has no rales.  Musculoskeletal: Normal range of motion. He exhibits edema (Trace).  4 out of 5 in right arm and right leg, limping gait  Neurological: He is alert and oriented to person, place, and time. A sensory deficit is present. No cranial nerve deficit (No noticeable cranial nerve deficit). He exhibits abnormal muscle tone. He displays no seizure activity. Gait abnormal.  Skin: Skin is warm and dry. No rash noted. He is not diaphoretic.  Psychiatric: He has a normal mood and affect. His behavior is normal.  Nursing note and vitals reviewed.     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  Counseling provided for all of the vaccine components Orders Placed This Encounter  Procedures  . Ambulatory referral to Physical Therapy  . Ambulatory referral to Speech Therapy  . Ambulatory referral to Neurology  . Face-to-face encounter (required for Medicare/Medicaid patients)  . Home Health  . Face-to-face encounter (required for Medicare/Medicaid patients)    Arville CareJoshua Dettinger, MD [facility] 02/23/2016, 2:52 PM

## 2016-03-01 ENCOUNTER — Ambulatory Visit (INDEPENDENT_AMBULATORY_CARE_PROVIDER_SITE_OTHER): Payer: Medicaid Other | Admitting: Family Medicine

## 2016-03-01 DIAGNOSIS — I69320 Aphasia following cerebral infarction: Secondary | ICD-10-CM

## 2016-03-01 DIAGNOSIS — E1151 Type 2 diabetes mellitus with diabetic peripheral angiopathy without gangrene: Secondary | ICD-10-CM

## 2016-03-01 DIAGNOSIS — I69351 Hemiplegia and hemiparesis following cerebral infarction affecting right dominant side: Secondary | ICD-10-CM | POA: Diagnosis not present

## 2016-03-01 DIAGNOSIS — I10 Essential (primary) hypertension: Secondary | ICD-10-CM

## 2016-03-20 ENCOUNTER — Telehealth: Payer: Self-pay

## 2016-03-21 NOTE — Telephone Encounter (Signed)
Edwin White Kayla to go ahead and send that order

## 2016-03-27 ENCOUNTER — Other Ambulatory Visit: Payer: Self-pay | Admitting: Family Medicine

## 2016-03-27 DIAGNOSIS — E119 Type 2 diabetes mellitus without complications: Secondary | ICD-10-CM

## 2016-03-27 DIAGNOSIS — Z794 Long term (current) use of insulin: Principal | ICD-10-CM

## 2016-04-11 ENCOUNTER — Ambulatory Visit (INDEPENDENT_AMBULATORY_CARE_PROVIDER_SITE_OTHER): Payer: Medicaid Other | Admitting: Family Medicine

## 2016-04-11 ENCOUNTER — Encounter: Payer: Self-pay | Admitting: Family Medicine

## 2016-04-11 VITALS — BP 132/84 | HR 64 | Temp 98.8°F | Ht 72.0 in | Wt 220.0 lb

## 2016-04-11 DIAGNOSIS — J439 Emphysema, unspecified: Secondary | ICD-10-CM | POA: Diagnosis not present

## 2016-04-11 DIAGNOSIS — I10 Essential (primary) hypertension: Secondary | ICD-10-CM | POA: Diagnosis not present

## 2016-04-11 DIAGNOSIS — E782 Mixed hyperlipidemia: Secondary | ICD-10-CM

## 2016-04-11 DIAGNOSIS — E1165 Type 2 diabetes mellitus with hyperglycemia: Secondary | ICD-10-CM | POA: Diagnosis not present

## 2016-04-11 DIAGNOSIS — Z794 Long term (current) use of insulin: Secondary | ICD-10-CM | POA: Diagnosis not present

## 2016-04-11 LAB — BAYER DCA HB A1C WAIVED: HB A1C: 7.3 % — AB (ref <7.0)

## 2016-04-11 MED ORDER — NIFEDIPINE ER 60 MG PO TB24: 60.0000 mg | ORAL_TABLET | Freq: Every day | ORAL | 2 refills | Status: DC

## 2016-04-11 MED ORDER — CANAGLIFLOZIN 100 MG PO TABS: 100.0000 mg | ORAL_TABLET | Freq: Every day | ORAL | 2 refills | Status: DC

## 2016-04-11 MED ORDER — ATORVASTATIN CALCIUM 20 MG PO TABS: 20.0000 mg | ORAL_TABLET | Freq: Every day | ORAL | 2 refills | Status: DC

## 2016-04-11 MED ORDER — LISINOPRIL 40 MG PO TABS: 40.0000 mg | ORAL_TABLET | Freq: Every day | ORAL | 2 refills | Status: DC

## 2016-04-11 MED ORDER — LINAGLIPTIN-METFORMIN HCL ER 2.5-1000 MG PO TB24: 1.0000 | ORAL_TABLET | Freq: Two times a day (BID) | ORAL | 2 refills | Status: DC

## 2016-04-11 MED ORDER — INSULIN GLARGINE 100 UNIT/ML SOLOSTAR PEN: 50.0000 [IU] | PEN_INJECTOR | Freq: Every day | SUBCUTANEOUS | 99 refills | Status: DC

## 2016-04-11 MED ORDER — GABAPENTIN 400 MG PO CAPS: 400.0000 mg | ORAL_CAPSULE | Freq: Two times a day (BID) | ORAL | 2 refills | Status: DC

## 2016-04-11 NOTE — Progress Notes (Signed)
BP 132/84   Pulse 64   Temp 98.8 F (37.1 C) (Oral)   Ht 6' (1.829 m)   Wt 220 lb (99.8 kg)   BMI 29.84 kg/m    Subjective:    Patient ID: Edwin White, male    DOB: 02-Oct-1965, 50 y.o.   MRN: [id number]  HPI: Edwin White is a 50 y.o. male presenting on 04/11/2016 for Diabetes (3 month followup; patient is fasting); Hyperlipidemia; Hypertension; and COPD   HPI Hypertension recheck and hyperlipidemia. Patient comes in today for hypertension recheck. His blood pressure is 132/84. He recently had a stroke a few months ago when he had a stroke a few years ago. He is also had known coronary artery disease with an  NSTEMI. Patient is currently on Lipitor and Coreg and lisinopril and Procardia and has nitroglycerin as needed. Patient denies headaches, blurred vision, chest pains, shortness of breath. Denies any side effects from medication and is content with current medication. Patient has issues with right-sided weakness. He is on Xarelto as well  Type 2 diabetes Patient has type 2 diabetes that has been difficult to control due to compliance. It is difficult to ascertain whether he is taking all of his medications appropriately when his diabetes is been uncontrolled as he has both neurological and cardiac effects of diabetes and that he has had a stroke and heart disease. He is currently on an ACE inhibitor and on linagliptin-metformin combination and Invokana and Lantus 50 units daily  COPD recheck. Patient has a rescue inhaler that he uses intermittently but has not had to use it for quite some time. He is still smoking. He denies any coughing or wheezing is.  Relevant past medical, surgical, family and social history reviewed and updated as indicated. Interim medical history since our last visit reviewed. Allergies and medications reviewed and updated.  Review of Systems  Constitutional: Negative for chills and fever.  HENT: Negative for congestion, sinus pressure and sneezing.    Eyes: Negative for discharge.  Respiratory: Negative for cough, chest tightness, shortness of breath and wheezing.   Cardiovascular: Negative for chest pain and leg swelling.  Endocrine: Negative for cold intolerance, heat intolerance, polydipsia and polyuria.  Musculoskeletal: Negative for back pain and gait problem.  Skin: Negative for rash.  Neurological: Positive for speech difficulty and weakness. Negative for dizziness, light-headedness, numbness and headaches.  All other systems reviewed and are negative.   Per HPI unless specifically indicated above     Medication List       Accurate as of 04/11/16 10:49 AM. Always use your most recent med list.          ACCU-CHEK AVIVA device Use as instructed   atorvastatin 20 MG tablet Commonly known as:  LIPITOR Take 1 tablet (20 mg total) by mouth daily.   canagliflozin 100 MG Tabs tablet Commonly known as:  INVOKANA Take 1 tablet (100 mg total) by mouth daily before breakfast.   carvedilol 12.5 MG tablet Commonly known as:  COREG Take 12.5 mg by mouth 2 (two) times daily with a meal.   furosemide 80 MG tablet Commonly known as:  LASIX TAKE 1 TABLET BY MOUTH DAILY   gabapentin 400 MG capsule Commonly known as:  NEURONTIN Take 1 capsule (400 mg total) by mouth 2 (two) times daily.   Insulin Glargine 100 UNIT/ML Solostar Pen Commonly known as:  LANTUS SOLOSTAR Inject 50 Units into the skin daily at 10 pm.   Linagliptin-Metformin HCl ER 2.10-998  MG Tb24 Commonly known as:  JENTADUETO XR Take 1 tablet by mouth 2 (two) times daily.   lisinopril 40 MG tablet Commonly known as:  PRINIVIL,ZESTRIL Take 1 tablet (40 mg total) by mouth daily.   nicotine 14 mg/24hr patch Commonly known as:  NICODERM CQ Place 1 patch (14 mg total) onto the skin daily.   NIFEdipine 60 MG 24 hr tablet Commonly known as:  PROCARDIA-XL/ADALAT CC Take 1 tablet (60 mg total) by mouth daily.   nitroGLYCERIN 0.4 MG SL tablet Commonly known  as:  NITROSTAT Place 1 tablet (0.4 mg total) under the tongue every 5 (five) minutes x 3 doses as needed for chest pain.   potassium chloride SA 20 MEQ tablet Commonly known as:  K-DUR,KLOR-CON TAKE 1 TABLET BY MOUTH DAILY   rivaroxaban 20 MG Tabs tablet Commonly known as:  XARELTO Take 1 tablet (20 mg total) by mouth daily with supper.          Objective:    BP 132/84   Pulse 64   Temp 98.8 F (37.1 C) (Oral)   Ht 6' (1.829 m)   Wt 220 lb (99.8 kg)   BMI 29.84 kg/m   Wt Readings from Last 3 Encounters:  04/11/16 220 lb (99.8 kg)  02/23/16 227 lb 6.4 oz (103.1 kg)  02/20/16 221 lb (100.2 kg)    Physical Exam  Constitutional: He is oriented to person, place, and time. He appears well-developed and well-nourished. No distress.  Eyes: Conjunctivae are normal. Right eye exhibits no discharge. Left eye exhibits no discharge. No scleral icterus.  Neck: Neck supple. No thyromegaly present.  Cardiovascular: Normal rate, regular rhythm, normal heart sounds and intact distal pulses.   No murmur heard. Pulmonary/Chest: Effort normal and breath sounds normal. No respiratory distress. He has no wheezes. He has no rales.  Musculoskeletal: Normal range of motion. He exhibits no edema.  Lymphadenopathy:    He has no cervical adenopathy.  Neurological: He is alert and oriented to person, place, and time. A cranial nerve deficit (Speech difficulty) and sensory deficit is present. He exhibits abnormal muscle tone (Right-sided weakness). Coordination normal.  Skin: Skin is warm and dry. No rash noted. He is not diaphoretic.  Psychiatric: He has a normal mood and affect. His behavior is normal.  Nursing note and vitals reviewed.     Assessment & Plan:   Problem List Items Addressed This Visit      Cardiovascular and Mediastinum   Hypertension - Primary   Relevant Medications   lisinopril (PRINIVIL,ZESTRIL) 40 MG tablet   NIFEdipine (PROCARDIA-XL/ADALAT CC) 60 MG 24 hr tablet    atorvastatin (LIPITOR) 20 MG tablet   Other Relevant Orders   CMP14+EGFR (Completed)   CBC with Differential/Platelet (Completed)     Respiratory   COPD (chronic obstructive pulmonary disease) (HCC)     Endocrine   Type 2 diabetes mellitus with hyperglycemia, with long-term current use of insulin (HCC)   Relevant Medications   lisinopril (PRINIVIL,ZESTRIL) 40 MG tablet   Insulin Glargine (LANTUS SOLOSTAR) 100 UNIT/ML Solostar Pen   gabapentin (NEURONTIN) 400 MG capsule   atorvastatin (LIPITOR) 20 MG tablet   Linagliptin-Metformin HCl ER (JENTADUETO XR) 2.10-998 MG TB24   canagliflozin (INVOKANA) 100 MG TABS tablet   Other Relevant Orders   Bayer DCA Hb A1c Waived (Completed)   CMP14+EGFR (Completed)   CBC with Differential/Platelet (Completed)   Ambulatory referral to Ophthalmology     Other   HLD (hyperlipidemia)   Relevant Medications  lisinopril (PRINIVIL,ZESTRIL) 40 MG tablet   NIFEdipine (PROCARDIA-XL/ADALAT CC) 60 MG 24 hr tablet   atorvastatin (LIPITOR) 20 MG tablet   Other Relevant Orders   Lipid panel (Completed)    Other Visit Diagnoses   None.      Follow up plan: Return in about 4 weeks (around 05/09/2016), or if symptoms worsen or fail to improve, for Medication recheck and diabetes recheck.  Counseling provided for all of the vaccine components Orders Placed This Encounter  Procedures  . Bayer DCA Hb A1c Waived  . CMP14+EGFR  . Lipid panel  . CBC with Differential/Platelet  . Ambulatory referral to Ophthalmology    Caryl Pina, MD [facility] 04/11/2016, 10:49 AM

## 2016-04-12 LAB — CBC WITH DIFFERENTIAL/PLATELET
BASOS ABS: 0 10*3/uL (ref 0.0–0.2)
Basos: 0 %
EOS (ABSOLUTE): 0.1 10*3/uL (ref 0.0–0.4)
Eos: 1 %
HEMOGLOBIN: 15.6 g/dL (ref 12.6–17.7)
Hematocrit: 43.7 % (ref 37.5–51.0)
Immature Grans (Abs): 0 10*3/uL (ref 0.0–0.1)
Immature Granulocytes: 0 %
LYMPHS ABS: 2.6 10*3/uL (ref 0.7–3.1)
LYMPHS: 30 %
MCH: 31.6 pg (ref 26.6–33.0)
MCHC: 35.7 g/dL (ref 31.5–35.7)
MCV: 89 fL (ref 79–97)
MONOCYTES: 5 %
Monocytes Absolute: 0.5 10*3/uL (ref 0.1–0.9)
Neutrophils Absolute: 5.4 10*3/uL (ref 1.4–7.0)
Neutrophils: 64 %
PLATELETS: 181 10*3/uL (ref 150–379)
RBC: 4.93 x10E6/uL (ref 4.14–5.80)
RDW: 14.5 % (ref 12.3–15.4)
WBC: 8.6 10*3/uL (ref 3.4–10.8)

## 2016-04-12 LAB — CMP14+EGFR
ALBUMIN: 4.3 g/dL (ref 3.5–5.5)
ALT: 19 IU/L (ref 0–44)
AST: 16 IU/L (ref 0–40)
Albumin/Globulin Ratio: 1.7 (ref 1.2–2.2)
Alkaline Phosphatase: 66 IU/L (ref 39–117)
BILIRUBIN TOTAL: 0.4 mg/dL (ref 0.0–1.2)
BUN / CREAT RATIO: 14 (ref 9–20)
BUN: 17 mg/dL (ref 6–24)
CALCIUM: 9.7 mg/dL (ref 8.7–10.2)
CHLORIDE: 98 mmol/L (ref 96–106)
CO2: 26 mmol/L (ref 18–29)
Creatinine, Ser: 1.21 mg/dL (ref 0.76–1.27)
GFR, EST AFRICAN AMERICAN: 79 mL/min/{1.73_m2} (ref >59)
GFR, EST NON AFRICAN AMERICAN: 68 mL/min/{1.73_m2} (ref >59)
GLUCOSE: 149 mg/dL — AB (ref 65–99)
Globulin, Total: 2.6 g/dL (ref 1.5–4.5)
Potassium: 4.7 mmol/L (ref 3.5–5.2)
Sodium: 139 mmol/L (ref 134–144)
TOTAL PROTEIN: 6.9 g/dL (ref 6.0–8.5)

## 2016-04-12 LAB — LIPID PANEL
CHOL/HDL RATIO: 6.7 ratio — AB (ref 0.0–5.0)
Cholesterol, Total: 214 mg/dL — ABNORMAL HIGH (ref 100–199)
HDL: 32 mg/dL — AB (ref >39)
LDL CALC: 129 mg/dL — AB (ref 0–99)
Triglycerides: 264 mg/dL — ABNORMAL HIGH (ref 0–149)
VLDL CHOLESTEROL CAL: 53 mg/dL — AB (ref 5–40)

## 2016-05-02 MED ORDER — ATORVASTATIN CALCIUM 40 MG PO TABS: 40.0000 mg | ORAL_TABLET | Freq: Every day | ORAL | 3 refills | Status: DC

## 2016-05-02 NOTE — Addendum Note (Signed)
Addended by: Almeta MonasSTONE, JANIE M on: 05/02/2016 12:32 PM   Modules accepted: Orders

## 2016-05-15 ENCOUNTER — Encounter: Payer: Self-pay | Admitting: Family Medicine

## 2016-05-15 ENCOUNTER — Ambulatory Visit (INDEPENDENT_AMBULATORY_CARE_PROVIDER_SITE_OTHER): Payer: Medicaid Other | Admitting: Family Medicine

## 2016-05-15 VITALS — BP 109/77 | HR 93 | Temp 98.7°F | Ht 72.0 in | Wt 216.4 lb

## 2016-05-15 DIAGNOSIS — E1165 Type 2 diabetes mellitus with hyperglycemia: Secondary | ICD-10-CM | POA: Diagnosis not present

## 2016-05-15 DIAGNOSIS — E782 Mixed hyperlipidemia: Secondary | ICD-10-CM

## 2016-05-15 DIAGNOSIS — I10 Essential (primary) hypertension: Secondary | ICD-10-CM | POA: Diagnosis not present

## 2016-05-15 DIAGNOSIS — Z794 Long term (current) use of insulin: Secondary | ICD-10-CM | POA: Diagnosis not present

## 2016-05-15 NOTE — Progress Notes (Signed)
BP 109/77   Pulse 93   Temp 98.7 F (37.1 C) (Oral)   Ht 6' (1.829 m)   Wt 216 lb 6 oz (98.1 kg)   BMI 29.35 kg/m    Subjective:    Patient ID: Edwin White, male    DOB: [birth year], 50 y.o.   MRN: [id number]  HPI: Edwin White is a 50 y.o. male presenting on 05/15/2016 for Diabetes (FOLLOWUP) and MEDICATIONS (FOLLLOWUP)   HPI Diabetes and hypertension and cholesterol medication check Patient is coming in today for a recheck on his diabetes and cholesterol and hypertension. Mainly were concerned about whether or not he was actually taking the medications appropriately and wanted him to bring his medications in with him. Patient has had strokes and it has affected his ability to mentally process things and we're a little concerned that there were some medication mix abs. He has switched to a pharmacy now that his prepackaging his medications in day by day form and he has one of the daily packets with him today to show to us. He says he is taking them every day and is following the every day packaging. He still has some weakness on his right side, more in his arm and his leg. 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 He did go see a neurologist and they are monitoring along with us for improvement. He is currently on Xarelto  for anticoagulation. Patient denies headaches, blurred vision, chest pains, shortness of breath. Denies any side effects from medication and is content with current medication.   Relevant past medical, surgical, family and social history reviewed and updated as indicated. Interim medical history since our last visit reviewed. Allergies and medications reviewed and updated.  Review of Systems  Constitutional: Negative for chills and fever.  Eyes: Negative for discharge.  Respiratory: Negative for cough, shortness of breath and wheezing.   Cardiovascular: Negative for chest pain, palpitations and leg swelling.    Musculoskeletal: Negative for back pain and gait problem.  Skin: Negative for rash.  Neurological: Positive for weakness and numbness. Negative for dizziness, tremors and headaches.  All other systems reviewed and are negative.   Per HPI unless specifically indicated above      Objective:    BP 109/77   Pulse 93   Temp 98.7 F (37.1 C) (Oral)   Ht 6' (1.829 m)   Wt 216 lb 6 oz (98.1 kg)   BMI 29.35 kg/m   Wt Readings from Last 3 Encounters:  05/15/16 216 lb 6 oz (98.1 kg)  04/11/16 220 lb (99.8 kg)  02/23/16 227 lb 6.4 oz (103.1 kg)    Physical Exam  Constitutional: He is oriented to person, place, and time. He appears well-developed and well-nourished. No distress.  Eyes: Conjunctivae are normal. Right eye exhibits no discharge. Left eye exhibits no discharge. No scleral icterus.  Cardiovascular: Normal rate, regular rhythm, normal heart sounds and intact distal pulses.   No murmur heard. Pulmonary/Chest: Effort normal and breath sounds normal. No respiratory distress. He has no wheezes. He has no rales.  Musculoskeletal: Normal range of motion. He exhibits no edema.  Neurological: He is alert and oriented to person, place, and time. A cranial nerve deficit (Right-sided facial drooping, improving) is present. Coordination (Patient has right-sided weakness and uses a cane.) abnormal.  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  Skin: Skin is warm and dry. No rash noted. He is not  diaphoretic.  Psychiatric: He has a normal mood and affect. His behavior is normal.  Nursing note and vitals reviewed.     Assessment & Plan:   Problem List Items Addressed This Visit      Cardiovascular and Mediastinum   Hypertension   Relevant Medications   aspirin EC 81 MG tablet     Endocrine   Type 2 diabetes mellitus with hyperglycemia, with long-term current use of insulin (HCC) - Primary   Relevant Medications   aspirin EC 81 MG tablet     Other   HLD (hyperlipidemia)   Relevant  Medications   aspirin EC 81 MG tablet       Follow up plan: Return in about 3 months (around 08/15/2016), or if symptoms worsen or fail to improve, for Recheck diabetes and hypertension and cholesterol.  Counseling provided for all of the vaccine components No orders of the defined types were placed in this encounter.   Arville CareJoshua Dettinger, MD [facility] 05/15/2016, 3:36 PM

## 2016-05-28 ENCOUNTER — Encounter: Payer: Self-pay | Admitting: Cardiovascular Disease

## 2016-05-28 ENCOUNTER — Other Ambulatory Visit: Payer: Self-pay | Admitting: Cardiovascular Disease

## 2016-05-28 ENCOUNTER — Other Ambulatory Visit: Payer: Self-pay | Admitting: Family Medicine

## 2016-05-28 ENCOUNTER — Other Ambulatory Visit: Payer: Self-pay | Admitting: *Deleted

## 2016-05-28 ENCOUNTER — Ambulatory Visit: Payer: Medicaid Other | Admitting: Cardiovascular Disease

## 2016-05-28 DIAGNOSIS — I10 Essential (primary) hypertension: Secondary | ICD-10-CM

## 2016-05-28 DIAGNOSIS — I5022 Chronic systolic (congestive) heart failure: Secondary | ICD-10-CM

## 2016-05-28 DIAGNOSIS — E119 Type 2 diabetes mellitus without complications: Secondary | ICD-10-CM

## 2016-05-28 DIAGNOSIS — Z794 Long term (current) use of insulin: Secondary | ICD-10-CM

## 2016-05-28 DIAGNOSIS — I6330 Cerebral infarction due to thrombosis of unspecified cerebral artery: Secondary | ICD-10-CM

## 2016-05-28 MED ORDER — CARVEDILOL 12.5 MG PO TABS: 12.5000 mg | ORAL_TABLET | Freq: Two times a day (BID) | ORAL | 0 refills | Status: DC

## 2016-06-22 ENCOUNTER — Other Ambulatory Visit: Payer: Self-pay | Admitting: Cardiovascular Disease

## 2016-06-22 ENCOUNTER — Other Ambulatory Visit: Payer: Self-pay | Admitting: Family Medicine

## 2016-07-25 ENCOUNTER — Other Ambulatory Visit: Payer: Self-pay | Admitting: Cardiovascular Disease

## 2016-08-01 ENCOUNTER — Telehealth: Payer: Self-pay

## 2016-08-01 MED ORDER — SITAGLIP PHOS-METFORMIN HCL ER 50-1000 MG PO TB24: 1.0000 | ORAL_TABLET | Freq: Two times a day (BID) | ORAL | 2 refills | Status: DC

## 2016-08-01 NOTE — Telephone Encounter (Signed)
LMOVM that Rx has been changed & sent to pharmacy for one that insurance says they will cover

## 2016-08-03 ENCOUNTER — Telehealth: Payer: Self-pay | Admitting: Pharmacist

## 2016-08-03 NOTE — Telephone Encounter (Signed)
[facility]  I faxed over card to use for Janumet XR so patient can get #30 days free.  This should be enough time for our office to contact Medicaid with information needed for [name] to get JanumetXR.

## 2016-08-15 ENCOUNTER — Encounter: Payer: Self-pay | Admitting: Family Medicine

## 2016-08-15 ENCOUNTER — Ambulatory Visit (INDEPENDENT_AMBULATORY_CARE_PROVIDER_SITE_OTHER): Payer: Medicaid Other | Admitting: Family Medicine

## 2016-08-15 VITALS — BP 148/92 | HR 91 | Temp 98.7°F | Ht 72.0 in | Wt 223.6 lb

## 2016-08-15 DIAGNOSIS — E782 Mixed hyperlipidemia: Secondary | ICD-10-CM | POA: Diagnosis not present

## 2016-08-15 DIAGNOSIS — Z794 Long term (current) use of insulin: Secondary | ICD-10-CM | POA: Diagnosis not present

## 2016-08-15 DIAGNOSIS — I10 Essential (primary) hypertension: Secondary | ICD-10-CM | POA: Diagnosis not present

## 2016-08-15 DIAGNOSIS — E1165 Type 2 diabetes mellitus with hyperglycemia: Secondary | ICD-10-CM

## 2016-08-15 LAB — BAYER DCA HB A1C WAIVED: HB A1C (BAYER DCA - WAIVED): 8.2 % — ABNORMAL HIGH (ref <7.0)

## 2016-08-15 NOTE — Progress Notes (Signed)
BP (!) 148/92   Pulse 91   Temp 98.7 F (37.1 C) (Oral)   Ht 6' (1.829 m)   Wt 223 lb 9.6 oz (101.4 kg)   BMI 30.33 kg/m    Subjective:    Patient ID: Edwin White, male    DOB: [birth year], 51 y.o.   MRN: [id number]  HPI: Edwin White is a 51 y.o. male presenting on 08/15/2016 for Diabetes (3 month recheck); Hypertension; and Hyperlipidemia   HPI Hypertension and diabetes and cholesterol recheck  Patient is coming in today for a recheck on his hypertension and cholesterol and diabetes. His medications were updated and he gets them prepackaged through the pharmacy. He denies any issues with his medication. Patient denies headaches, blurred vision, chest pains, shortness of breath, or weakness. Denies any side effects from medication and is content with current medication. 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 Nothing has worsened but it does not feel like it's improved greatly.  Type 2 diabetes mellitus Patient comes in today for recheck of his diabetes. He has been currently taking Jentadueto and invokana. He is currently on an ACE inhibitor. He has not seen an ophthalmologist this year. He denies any new issues with his feet.   Relevant past medical, surgical, family and social history reviewed and updated as indicated. Interim medical history since our last visit reviewed. Allergies and medications reviewed and updated.  Review of Systems  Constitutional: Negative for chills and fever.  Eyes: Negative for discharge.  Respiratory: Negative for cough, shortness of breath and wheezing.   Cardiovascular: Negative for chest pain, palpitations and leg swelling.  Musculoskeletal: Negative for back pain and gait problem.  Skin: Negative for rash.  Neurological: Positive for speech difficulty, weakness and numbness. Negative for dizziness, syncope, facial asymmetry and headaches.  All other  systems reviewed and are negative.   Per HPI unless specifically indicated above     Objective:    BP (!) 148/92   Pulse 91   Temp 98.7 F (37.1 C) (Oral)   Ht 6' (1.829 m)   Wt 223 lb 9.6 oz (101.4 kg)   BMI 30.33 kg/m   Wt Readings from Last 3 Encounters:  08/15/16 223 lb 9.6 oz (101.4 kg)  05/15/16 216 lb 6 oz (98.1 kg)  04/11/16 220 lb (99.8 kg)    Physical Exam  Constitutional: He is oriented to person, place, and time. He appears well-developed and well-nourished. No distress.  Eyes: Conjunctivae are normal. Right eye exhibits no discharge. Left eye exhibits no discharge. No scleral icterus.  Cardiovascular: Normal rate, regular rhythm, normal heart sounds and intact distal pulses.   No murmur heard. Pulmonary/Chest: Effort normal and breath sounds normal. No respiratory distress. He has no wheezes. He has no rales.  Musculoskeletal: Normal range of motion. He exhibits no edema.  Neurological: He is alert and oriented to person, place, and time. Coordination (Walks using a cane) abnormal.  Skin: Skin is warm and dry. No rash noted. He is not diaphoretic.  Psychiatric: He has a normal mood and affect. His behavior is normal.  Nursing note and vitals reviewed.   Diabetic Foot Exam - Simple   Simple Foot Form Diabetic Foot exam was performed with the following findings:  Yes 08/15/2016 10:06 AM  Visual Inspection Sensation Testing Pulse Check Comments Decreased sensation on left toes        Assessment & Plan:  Problem List Items Addressed This Visit      Cardiovascular and Mediastinum   Hypertension - Primary   Relevant Orders   CMP14+EGFR     Endocrine   Type 2 diabetes mellitus with hyperglycemia, with long-term current use of insulin (HCC)   Relevant Medications   Linagliptin-Metformin HCl (JENTADUETO) 2.10-998 MG TABS   Other Relevant Orders   Bayer DCA Hb A1c Waived     Other   HLD (hyperlipidemia)       Follow up plan: Return in about 3  months (around 11/12/2016), or if symptoms worsen or fail to improve, for Diabetes recheck.  Counseling provided for all of the vaccine components Orders Placed This Encounter  Procedures  . CMP14+EGFR  . Bayer [facility] Hb A1c [place], MD [facility] 08/15/2016, 10:05 AM

## 2016-08-16 LAB — CMP14+EGFR
ALT: 16 IU/L (ref 0–44)
AST: 14 IU/L (ref 0–40)
Albumin/Globulin Ratio: 1.5 (ref 1.2–2.2)
Albumin: 4.2 g/dL (ref 3.5–5.5)
Alkaline Phosphatase: 79 IU/L (ref 39–117)
BILIRUBIN TOTAL: 0.2 mg/dL (ref 0.0–1.2)
BUN/Creatinine Ratio: 15 (ref 9–20)
BUN: 14 mg/dL (ref 6–24)
CALCIUM: 9.5 mg/dL (ref 8.7–10.2)
CHLORIDE: 97 mmol/L (ref 96–106)
CO2: 25 mmol/L (ref 18–29)
Creatinine, Ser: 0.92 mg/dL (ref 0.76–1.27)
GFR, EST AFRICAN AMERICAN: 110 mL/min/{1.73_m2} (ref >59)
GFR, EST NON AFRICAN AMERICAN: 95 mL/min/{1.73_m2} (ref >59)
GLOBULIN, TOTAL: 2.8 g/dL (ref 1.5–4.5)
Glucose: 157 mg/dL — ABNORMAL HIGH (ref 65–99)
Potassium: 4.1 mmol/L (ref 3.5–5.2)
Sodium: 139 mmol/L (ref 134–144)
TOTAL PROTEIN: 7 g/dL (ref 6.0–8.5)

## 2016-08-24 ENCOUNTER — Other Ambulatory Visit: Payer: Self-pay | Admitting: Family Medicine

## 2016-08-24 ENCOUNTER — Telehealth: Payer: Self-pay | Admitting: Family Medicine

## 2016-08-24 DIAGNOSIS — Z794 Long term (current) use of insulin: Principal | ICD-10-CM

## 2016-08-24 DIAGNOSIS — E119 Type 2 diabetes mellitus without complications: Secondary | ICD-10-CM

## 2016-08-24 DIAGNOSIS — E1165 Type 2 diabetes mellitus with hyperglycemia: Secondary | ICD-10-CM

## 2016-08-24 MED ORDER — INSULIN GLARGINE 100 UNIT/ML SOLOSTAR PEN
20.0000 [IU] | PEN_INJECTOR | Freq: Every day | SUBCUTANEOUS | 5 refills | Status: DC
Start: 2016-08-24 — End: 2018-05-28

## 2016-08-24 NOTE — Telephone Encounter (Signed)
-----   Message from Quay BurowJanice K Potter, LPN sent at [id number]/23/2018 12:17 PM EST ----- Patient has not been taking his Lantus because he is out of his medication.  He said [facility], they will deliver to him and he will start taking it again.

## 2016-08-24 NOTE — Telephone Encounter (Signed)
Patient needs letter stating he is unable to serve on jury duty because of medical conditions, please advise.

## 2016-09-05 NOTE — Telephone Encounter (Signed)
Attempted to contact patient no answer and no vm    

## 2016-09-07 ENCOUNTER — Encounter: Payer: Self-pay | Admitting: *Deleted

## 2016-09-07 NOTE — Telephone Encounter (Signed)
Letter mailed with details.

## 2016-09-08 ENCOUNTER — Telehealth: Payer: Self-pay | Admitting: Family Medicine

## 2016-09-11 ENCOUNTER — Other Ambulatory Visit: Payer: Self-pay | Admitting: *Deleted

## 2016-09-11 NOTE — Telephone Encounter (Signed)
Incoming call today. There was no actual paperwork dropped off at the office. Pt needs letter to be excused from jury duty.

## 2016-09-12 NOTE — Telephone Encounter (Signed)
Sister states that there is no paper, that pt just needs a letter stating that he is not able to do jury duty due to his health and speech. Sister states it needs to be done today if possible. If approved please do letter.

## 2016-09-12 NOTE — Telephone Encounter (Signed)
Yes I'm fine with going ahead and doing this letter, let her know that often times the jury duty has a specific form that they want us to fill out but we will go ahead and do the latter but let her know that sometimes they want her to get a form signed for it. 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

## 2016-09-12 NOTE — Telephone Encounter (Signed)
I don't see the paperwork on my desk.

## 2016-09-12 NOTE — Telephone Encounter (Signed)
Pt's sister notified letter is ready for pick up Letter to front

## 2016-09-26 ENCOUNTER — Other Ambulatory Visit: Payer: Self-pay | Admitting: Cardiovascular Disease

## 2016-10-18 ENCOUNTER — Other Ambulatory Visit: Payer: Self-pay | Admitting: Cardiovascular Disease

## 2016-10-31 ENCOUNTER — Inpatient Hospital Stay (HOSPITAL_COMMUNITY)
Admission: EM | Admit: 2016-10-31 | Discharge: 2016-11-04 | DRG: 280 | Disposition: A | Discharge status: Home / Self Care | Payer: Medicaid Other | Attending: Cardiovascular Disease | Admitting: Cardiovascular Disease

## 2016-10-31 ENCOUNTER — Emergency Department (HOSPITAL_COMMUNITY): Payer: Medicaid Other

## 2016-10-31 ENCOUNTER — Encounter (HOSPITAL_COMMUNITY): Payer: Self-pay

## 2016-10-31 DIAGNOSIS — I513 Intracardiac thrombosis, not elsewhere classified: Secondary | ICD-10-CM | POA: Diagnosis present

## 2016-10-31 DIAGNOSIS — I340 Nonrheumatic mitral (valve) insufficiency: Secondary | ICD-10-CM | POA: Diagnosis present

## 2016-10-31 DIAGNOSIS — I69391 Dysphagia following cerebral infarction: Secondary | ICD-10-CM

## 2016-10-31 DIAGNOSIS — I5043 Acute on chronic combined systolic (congestive) and diastolic (congestive) heart failure: Secondary | ICD-10-CM | POA: Diagnosis present

## 2016-10-31 DIAGNOSIS — I110 Hypertensive heart disease with heart failure: Secondary | ICD-10-CM | POA: Diagnosis present

## 2016-10-31 DIAGNOSIS — E1143 Type 2 diabetes mellitus with diabetic autonomic (poly)neuropathy: Secondary | ICD-10-CM | POA: Diagnosis present

## 2016-10-31 DIAGNOSIS — K219 Gastro-esophageal reflux disease without esophagitis: Secondary | ICD-10-CM | POA: Diagnosis present

## 2016-10-31 DIAGNOSIS — J811 Chronic pulmonary edema: Secondary | ICD-10-CM | POA: Diagnosis present

## 2016-10-31 DIAGNOSIS — E785 Hyperlipidemia, unspecified: Secondary | ICD-10-CM | POA: Diagnosis present

## 2016-10-31 DIAGNOSIS — I214 Non-ST elevation (NSTEMI) myocardial infarction: Principal | ICD-10-CM | POA: Diagnosis present

## 2016-10-31 DIAGNOSIS — I10 Essential (primary) hypertension: Secondary | ICD-10-CM | POA: Diagnosis present

## 2016-10-31 DIAGNOSIS — I2510 Atherosclerotic heart disease of native coronary artery without angina pectoris: Secondary | ICD-10-CM | POA: Diagnosis present

## 2016-10-31 DIAGNOSIS — I255 Ischemic cardiomyopathy: Secondary | ICD-10-CM | POA: Diagnosis present

## 2016-10-31 DIAGNOSIS — J9601 Acute respiratory failure with hypoxia: Secondary | ICD-10-CM | POA: Diagnosis present

## 2016-10-31 DIAGNOSIS — Z7982 Long term (current) use of aspirin: Secondary | ICD-10-CM

## 2016-10-31 DIAGNOSIS — Z794 Long term (current) use of insulin: Secondary | ICD-10-CM

## 2016-10-31 DIAGNOSIS — Z7901 Long term (current) use of anticoagulants: Secondary | ICD-10-CM

## 2016-10-31 DIAGNOSIS — Z79899 Other long term (current) drug therapy: Secondary | ICD-10-CM

## 2016-10-31 DIAGNOSIS — I6330 Cerebral infarction due to thrombosis of unspecified cerebral artery: Secondary | ICD-10-CM | POA: Diagnosis present

## 2016-10-31 DIAGNOSIS — F17210 Nicotine dependence, cigarettes, uncomplicated: Secondary | ICD-10-CM | POA: Diagnosis present

## 2016-10-31 DIAGNOSIS — Z955 Presence of coronary angioplasty implant and graft: Secondary | ICD-10-CM

## 2016-10-31 DIAGNOSIS — Z9114 Patient's other noncompliance with medication regimen: Secondary | ICD-10-CM

## 2016-10-31 DIAGNOSIS — I160 Hypertensive urgency: Secondary | ICD-10-CM | POA: Diagnosis present

## 2016-10-31 DIAGNOSIS — K3184 Gastroparesis: Secondary | ICD-10-CM | POA: Diagnosis present

## 2016-10-31 DIAGNOSIS — I252 Old myocardial infarction: Secondary | ICD-10-CM

## 2016-10-31 DIAGNOSIS — Z951 Presence of aortocoronary bypass graft: Secondary | ICD-10-CM

## 2016-10-31 DIAGNOSIS — I509 Heart failure, unspecified: Secondary | ICD-10-CM

## 2016-10-31 DIAGNOSIS — J449 Chronic obstructive pulmonary disease, unspecified: Secondary | ICD-10-CM | POA: Diagnosis present

## 2016-10-31 LAB — I-STAT CHEM 8, ED
BUN: 16 mg/dL (ref 6–20)
CHLORIDE: 100 mmol/L — AB (ref 101–111)
Calcium, Ion: 1.19 mmol/L (ref 1.15–1.40)
Creatinine, Ser: 1.2 mg/dL (ref 0.61–1.24)
GLUCOSE: 362 mg/dL — AB (ref 65–99)
HCT: 54 % — ABNORMAL HIGH (ref 39.0–52.0)
Hemoglobin: 18.4 g/dL — ABNORMAL HIGH (ref 13.0–17.0)
POTASSIUM: 3.9 mmol/L (ref 3.5–5.1)
Sodium: 135 mmol/L (ref 135–145)
TCO2: 24 mmol/L (ref 0–100)

## 2016-10-31 LAB — COMPREHENSIVE METABOLIC PANEL
ALBUMIN: 4 g/dL (ref 3.5–5.0)
ALK PHOS: 111 U/L (ref 38–126)
ALT: 32 U/L (ref 17–63)
AST: 51 U/L — AB (ref 15–41)
Anion gap: 14 (ref 5–15)
BILIRUBIN TOTAL: 0.6 mg/dL (ref 0.3–1.2)
BUN: 15 mg/dL (ref 6–20)
CALCIUM: 9.7 mg/dL (ref 8.9–10.3)
CO2: 23 mmol/L (ref 22–32)
Chloride: 98 mmol/L — ABNORMAL LOW (ref 101–111)
Creatinine, Ser: 1.34 mg/dL — ABNORMAL HIGH (ref 0.61–1.24)
GFR calc Af Amer: >60 mL/min (ref >60)
GFR calc non Af Amer: 59 mL/min — ABNORMAL LOW (ref >60)
GLUCOSE: 357 mg/dL — AB (ref 65–99)
Potassium: 3.8 mmol/L (ref 3.5–5.1)
Sodium: 135 mmol/L (ref 135–145)
TOTAL PROTEIN: 7.5 g/dL (ref 6.5–8.1)

## 2016-10-31 LAB — CBC WITH DIFFERENTIAL/PLATELET
BASOS ABS: 0.1 10*3/uL (ref 0.0–0.1)
BASOS PCT: 1 %
Eosinophils Absolute: 0.2 10*3/uL (ref 0.0–0.7)
Eosinophils Relative: 1 %
HEMATOCRIT: 50.1 % (ref 39.0–52.0)
HEMOGLOBIN: 17.4 g/dL — AB (ref 13.0–17.0)
Lymphocytes Relative: 57 %
Lymphs Abs: 9.7 10*3/uL — ABNORMAL HIGH (ref 0.7–4.0)
MCH: 31.6 pg (ref 26.0–34.0)
MCHC: 34.7 g/dL (ref 30.0–36.0)
MCV: 91.1 fL (ref 78.0–100.0)
MONOS PCT: 5 %
Monocytes Absolute: 0.9 10*3/uL (ref 0.1–1.0)
NEUTROS ABS: 6 10*3/uL (ref 1.7–7.7)
NEUTROS PCT: 36 %
Platelets: 189 10*3/uL (ref 150–400)
RBC: 5.5 MIL/uL (ref 4.22–5.81)
RDW: 14.3 % (ref 11.5–15.5)
WBC: 16.9 10*3/uL — ABNORMAL HIGH (ref 4.0–10.5)

## 2016-10-31 LAB — I-STAT TROPONIN, ED: TROPONIN I, POC: 0.04 ng/mL (ref 0.00–0.08)

## 2016-10-31 LAB — CBG MONITORING, ED: GLUCOSE-CAPILLARY: 355 mg/dL — AB (ref 65–99)

## 2016-10-31 MED ORDER — FUROSEMIDE 10 MG/ML IJ SOLN
40.0000 mg | Freq: Once | INTRAMUSCULAR | Status: AC
  Administered 2016-10-31: 40 mg via INTRAVENOUS
  Filled 2016-10-31: qty 4

## 2016-10-31 MED ORDER — NITROGLYCERIN IN D5W 200-5 MCG/ML-% IV SOLN
INTRAVENOUS | Status: AC
  Filled 2016-10-31: qty 250

## 2016-10-31 MED ORDER — LORAZEPAM 2 MG/ML IJ SOLN
INTRAMUSCULAR | Status: AC
  Filled 2016-10-31: qty 1

## 2016-10-31 MED ORDER — IPRATROPIUM BROMIDE 0.02 % IN SOLN
RESPIRATORY_TRACT | Status: AC
  Administered 2016-10-31: 0.5 mg
  Filled 2016-10-31: qty 2.5

## 2016-10-31 MED ORDER — LORAZEPAM 2 MG/ML IJ SOLN
0.5000 mg | Freq: Once | INTRAMUSCULAR | Status: AC
  Administered 2016-10-31: 0.5 mg via INTRAVENOUS

## 2016-10-31 MED ORDER — HYDRALAZINE HCL 20 MG/ML IJ SOLN
5.0000 mg | Freq: Once | INTRAMUSCULAR | Status: AC
  Administered 2016-10-31: 5 mg via INTRAVENOUS
  Filled 2016-10-31: qty 1

## 2016-10-31 MED ORDER — FUROSEMIDE 10 MG/ML IJ SOLN: 80.0000 mg | Freq: Once | INTRAMUSCULAR | Status: DC

## 2016-10-31 MED ORDER — DILTIAZEM HCL-DEXTROSE 100-5 MG/100ML-% IV SOLN (PREMIX)
INTRAVENOUS | Status: AC
  Filled 2016-10-31: qty 100

## 2016-10-31 MED ORDER — LEVALBUTEROL HCL 1.25 MG/0.5ML IN NEBU
INHALATION_SOLUTION | RESPIRATORY_TRACT | Status: AC
Start: 2016-10-31 — End: 2016-10-31
  Administered 2016-10-31: 2.5 mg
  Filled 2016-10-31: qty 1

## 2016-10-31 MED ORDER — DILTIAZEM HCL 25 MG/5ML IV SOLN
10.0000 mg | Freq: Once | INTRAVENOUS | Status: AC
  Administered 2016-10-31: 10 mg via INTRAVENOUS

## 2016-10-31 MED ORDER — METHYLPREDNISOLONE SODIUM SUCC 125 MG IJ SOLR
125.0000 mg | Freq: Once | INTRAMUSCULAR | Status: DC
  Filled 2016-10-31: qty 2

## 2016-10-31 MED ORDER — HYDRALAZINE HCL 20 MG/ML IJ SOLN
10.0000 mg | Freq: Once | INTRAMUSCULAR | Status: AC
  Administered 2016-10-31: 10 mg via INTRAVENOUS
  Filled 2016-10-31: qty 1

## 2016-10-31 MED ORDER — NITROGLYCERIN IN D5W 200-5 MCG/ML-% IV SOLN
5.0000 ug/min | INTRAVENOUS | Status: DC
  Administered 2016-10-31: 10 ug/min via INTRAVENOUS

## 2016-10-31 NOTE — ED Provider Notes (Addendum)
[facility]-EMERGENCY DEPT Provider Note   CSN: [id number] Arrival date & time: 10/31/16  2226  By signing my name below, I, Elder Negus, attest that this documentation has been prepared under the direction and in the presence of Gilda Crease, MD. Electronically Signed: Elder Negus, Scribe. 11/01/16. 12:34 AM.   History   Chief Complaint Chief Complaint  Patient presents with  . Shortness of Breath   LEVEL 5 CAVEAT DUE TO RESPIRATORY DISTRESS  HPI Edwin White is a 51 y.o. male with history of HTN, IDDM, and CHF who presents to the ED in respiratory distress. According to medic report, they were called to the patient's home for worsening dyspnea. On arrival he is diaphoretic with labored breathing. Placed on CPAP. Medic reports non-compliance on home medications.   A complete history is limited by respiratory distress.  The history is provided by the EMS personnel. No language interpreter was used.    Past Medical History:  Diagnosis Date  . CHF (congestive heart failure) (HCC)    No recurrent admissions for heart failure.  . Coronary artery disease    Status post coronary bypass grafting in 2007  . Crescendo transient ischemic attacks   . Functional bowel disorder    Bloating and abdominal cramping as well as constipation requiring lubiprostone  . Gastroparesis    Gastric emptying study scheduled  . GERD (gastroesophageal reflux disease)   . Heart attack [facility]) September 13, 2004   Followed by bypass surgery  . History of medication noncompliance   . Hyperlipidemia   . Hypertension    Poorly controlled  . Ischemic cardiomyopathy    Ejection fraction improved from 15%-20% to 40%-45% in 2012 by echocardiogram  . Moderate mitral regurgitation by prior echocardiogram   . NSTEMI (non-ST elevated myocardial infarction) (HCC) 12/31/2013  . Post-infarction apical thrombus (HCC)    Anticoagulation discontinued  . Stroke [facility]) 04/2012   denies residual on 12/31/2013  .  Type 2 diabetes mellitus [facility])     Patient Active Problem List   Diagnosis Date Noted  . Apical mural thrombus   . Tobacco abuse   . Cerebrovascular accident (CVA) due to thrombosis of cerebral artery (HCC) 02/05/2016  . 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 Type 2 diabetes mellitus with hyperglycemia, with long-term current use of insulin (HCC) 07/13/2015  . HLD (hyperlipidemia) 07/13/2015  . NSTEMI (non-ST elevated myocardial infarction) (HCC) 12/31/2013  . Hypokalemia 04/21/2012  . Thrombocytopenia (HCC) 04/20/2012  . Gastroparesis   . Ischemic cardiomyopathy   . Coronary artery disease   . Hypertension   . NONDEPENDENT TOBACCO USE DISORDER 05/24/2010  . 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 Chronic systolic congestive heart failure (HCC) 05/24/2010  . PVD WITH CLAUDICATION 05/24/2010  . COPD (chronic obstructive pulmonary disease) (HCC) 05/24/2010  . ESOPHAGEAL REFLUX 05/24/2010  . POSTSURGICAL AORTOCORONARY BYPASS STATUS 05/24/2010    Past Surgical History:  Procedure Laterality Date  . CORONARY ANGIOPLASTY WITH STENT PLACEMENT  2006  . CORONARY ARTERY BYPASS GRAFT  2007   "CABG X5"  . LEFT HEART CATHETERIZATION WITH CORONARY/GRAFT ANGIOGRAM N/A 01/04/2014   Procedure: LEFT HEART CATHETERIZATION WITH Isabel Caprice;  Surgeon: Kathleene Hazel, MD;  Location: [facility] CATH LAB;  Service: Cardiovascular;  Laterality: N/A;  . PERCUTANEOUS CORONARY STENT INTERVENTION (PCI-S)  01/04/2014   Procedure: PERCUTANEOUS CORONARY STENT INTERVENTION (PCI-S);  Surgeon: Kathleene Hazel, MD;  Location: [facility] CATH LAB;  Service: Cardiovascular;;  Home Medications    Prior to Admission medications   Medication Sig Start Date End Date Taking? Authorizing Provider  aspirin 81 MG EC tablet TAKE 1 TABLET BY MOUTH EVERY DAY 05/29/16   Elige Radon Dettinger, MD  atorvastatin (LIPITOR) 40 MG tablet Take 1 tablet (40 mg total)  by mouth daily. 05/02/16   Elige Radon Dettinger, MD  Blood Glucose Monitoring Suppl (ACCU-CHEK AVIVA) device Use as instructed 01/09/16 01/08/17  Elige Radon Dettinger, MD  canagliflozin [facility]) 100 MG TABS tablet Take 1 tablet (100 mg total) by mouth daily before breakfast. 04/11/16   Elige Radon Dettinger, MD  carvedilol (COREG) 12.5 MG tablet TAKE 1 TABLET BY MOUTH TWICE DAILY 10/19/16   Laqueta Linden, MD  furosemide (LASIX) 80 MG tablet TAKE 1 TABLET BY MOUTH DAILY 06/22/16   Laqueta Linden, MD  gabapentin (NEURONTIN) 400 MG capsule Take 1 capsule (400 mg total) by mouth 2 (two) times daily. 04/11/16   Elige Radon Dettinger, MD  gabapentin (NEURONTIN) 400 MG capsule TAKE 1 CAPSULE BY MOUTH TWICE DAILY 08/27/16   Elige Radon Dettinger, MD  Insulin Glargine (LANTUS SOLOSTAR) 100 UNIT/ML Solostar Pen Inject 20 Units into the skin daily at 10 pm. 08/24/16   Elige Radon Dettinger, MD  Linagliptin-Metformin HCl (JENTADUETO) 2.10-998 MG TABS Take 1 tablet by mouth daily.    Historical Provider, MD  lisinopril (PRINIVIL,ZESTRIL) 40 MG tablet Take 1 tablet (40 mg total) by mouth daily. 04/11/16   Elige Radon Dettinger, MD  NIFEdipine (PROCARDIA-XL/ADALAT CC) 60 MG 24 hr tablet TAKE 1 TABLET (60 MG TOTAL) BY MOUTH DAILY. 05/29/16   Elige Radon Dettinger, MD  nitroGLYCERIN (NITROSTAT) 0.4 MG SL tablet Place 1 tablet (0.4 mg total) under the tongue every 5 (five) minutes x 3 doses as needed for chest pain. Patient not taking: Reported on 08/15/2016 01/05/14   Brittainy Sherlynn Carbon, PA-C  potassium chloride SA (K-DUR,KLOR-CON) 20 MEQ tablet TAKE 1 TABLET BY MOUTH DAILY 06/22/16   Laqueta Linden, MD  rivaroxaban (XARELTO) 20 MG TABS tablet Take 1 tablet (20 mg total) by mouth daily with supper. 02/20/16   Jodelle Gross, NP    Family History Family History  Problem Relation Age of Onset  . Adopted: Yes  . Coronary artery disease Father   . Cancer Mother     Social History Social History  Substance Use Topics    . Smoking status: Current Every Day Smoker    Packs/day: 1.00    Years: 36.00    Types: Cigarettes    Start date: 05/05/1979  . Smokeless tobacco: Never Used  . Alcohol use No     Comment: "quit drinking alcohol in 2006"     Allergies   Pravastatin   Review of Systems Review of Systems  Unable to perform ROS: Severe respiratory distress     Physical Exam Updated Vital Signs BP 95/76   Pulse (!) 113   Resp (!) 25   Wt 223 lb (101.2 kg)   SpO2 99%   BMI 30.24 kg/m   Physical Exam  Constitutional: He is oriented to person, place, and time. He appears well-developed and well-nourished. No distress.  HENT:  Head: Normocephalic and atraumatic.  Right Ear: Hearing normal.  Left Ear: Hearing normal.  Nose: Nose normal.  Mouth/Throat: Oropharynx is clear and moist and mucous membranes are normal.  Eyes: Conjunctivae and EOM are normal. Pupils are equal, round, and reactive to light.  Neck: Normal range of motion. Neck supple.  Cardiovascular: Regular rhythm, S1 normal and S2 normal.  Tachycardia present.  Exam reveals no gallop and no friction rub.   No murmur heard. Pulmonary/Chest: Accessory muscle usage present. Tachypnea noted. He is in respiratory distress.  Diffuse rales  Abdominal: Soft. Normal appearance and bowel sounds are normal. There is no hepatosplenomegaly. There is no tenderness. There is no rebound, no guarding, no tenderness at McBurney's point and negative Murphy's sign. No hernia.  Musculoskeletal: Normal range of motion.  Neurological: He is alert and oriented to person, place, and time. He has normal strength. No cranial nerve deficit or sensory deficit. Coordination normal. GCS eye subscore is 4. GCS verbal subscore is 5. GCS motor subscore is 6.  Skin: Skin is warm, dry and intact. No rash noted. No cyanosis.  Psychiatric: He has a normal mood and affect. His speech is normal and behavior is normal. Thought content normal.  Nursing note and vitals  reviewed.    ED Treatments / Results  Labs (all labs ordered are listed, but only abnormal results are displayed) Labs Reviewed  CBC WITH DIFFERENTIAL/PLATELET - Abnormal; Notable for the following:       Result Value   WBC 16.9 (*)    Hemoglobin 17.4 (*)    Lymphs Abs 9.7 (*)    All other components within normal limits  COMPREHENSIVE METABOLIC PANEL - Abnormal; Notable for the following:    Chloride 98 (*)    Glucose, Bld 357 (*)    Creatinine, Ser 1.34 (*)    AST 51 (*)    GFR calc non Af Amer 59 (*)    All other components within normal limits  CBG MONITORING, ED - Abnormal; Notable for the following:    Glucose-Capillary 355 (*)    All other components within normal limits  I-STAT CHEM 8, ED - Abnormal; Notable for the following:    Chloride 100 (*)    Glucose, Bld 362 (*)    Hemoglobin 18.4 (*)    HCT 54.0 (*)    All other components within normal limits  I-STAT TROPOININ, ED    EKG  EKG Interpretation #1  Date/Time:  Wednesday Oct 31 2016 22:32:13 EDT Ventricular Rate:  181 PR Interval:    QRS Duration: 117 QT Interval:  326 QTC Calculation: 566 R Axis:   89 Text Interpretation:  narrow complex tachycardia Paired ventricular premature complexes st and t waves changes, possbly rate related Confirmed by Blinda Leatherwood  MD, Aviannah Castoro [phone]) on 11/01/2016 12:31:38 AM       EKG Interpretation #2  Date/Time:  Thursday Nov 01 2016 00:36:13 EDT Ventricular Rate:  108 PR Interval:    QRS Duration: 107 QT Interval:  388 QTC Calculation: 521 R Axis:   23 Text Interpretation:  Sinus tachycardia Ventricular premature complex Abnormal R-wave progression, late transition Nonspecific T abnormalities, lateral leads Prolonged QT interval Confirmed by Blinda Leatherwood  MD, Steph Cheadle [phone]) on 11/01/2016 12:38:32 AM        Radiology Dg Chest Portable 1 View  Result Date: 10/31/2016 CLINICAL DATA:  Dyspnea and wheezing tonight.  Diaphoresis. EXAM: PORTABLE CHEST 1 VIEW COMPARISON:   04/08/2015 FINDINGS: Unchanged moderate cardiomegaly. New perihilar airspace opacity, likely alveolar edema. Worsened vascular and interstitial congestive changes. No large pleural effusions. IMPRESSION: Congestive heart failure with interstitial and alveolar edema. Electronically Signed   By: Ellery Plunk M.D.   On: 10/31/2016 23:22    Procedures Procedures (including critical care time)  Medications Ordered in ED Medications  methylPREDNISolone sodium succinate (SOLU-MEDROL)  125 mg/2 mL injection 125 mg (125 mg Intravenous Not Given 10/31/16 2250)  dilTIAZem HCl-Dextrose 100-5 MG/100ML-% infusion SOLN (  Stopped 10/31/16 2252)  nitroGLYCERIN 50 mg in dextrose 5 % 250 mL (0.2 mg/mL) infusion (0 mcg/min Intravenous Paused 10/31/16 2333)  ipratropium (ATROVENT) 0.02 % nebulizer solution (0.5 mg  Given 10/31/16 2239)  levalbuterol (XOPENEX) 1.25 MG/0.5ML nebulizer solution (2.5 mg  Given 10/31/16 2239)  diltiazem (CARDIZEM) injection 10 mg (10 mg Intravenous Bolus 10/31/16 2253)  furosemide (LASIX) injection 40 mg (40 mg Intravenous Given 10/31/16 2240)  hydrALAZINE (APRESOLINE) injection 5 mg (5 mg Intravenous Given 10/31/16 2250)  hydrALAZINE (APRESOLINE) injection 10 mg (10 mg Intravenous Given 10/31/16 2304)  furosemide (LASIX) injection 40 mg (40 mg Intravenous Given 10/31/16 2303)  LORazepam (ATIVAN) injection 0.5 mg (0.5 mg Intravenous Given 10/31/16 2325)     Initial Impression / Assessment and Plan / ED Course  I have reviewed the triage vital signs and the nursing notes.  Pertinent labs & imaging results that were available during my care of the patient were reviewed by me and considered in my medical decision making (see chart for details).     Patient brought to the emergency department for respiratory distress. EMS report covering the patient at home in severe respiratory distress, unable to speak secondary to his dyspnea. He was profoundly hypoxic. Upon arrival to the ER, patient found to be  extremely hypertensive in addition to his hypoxia. Records reveal that the patient has a history of ischemic cardiomyopathy and medication noncompliance.  Patient placed on BiPAP at arrival for his respiratory distress. Chest x-ray consistent with congestive heart failure and pulmonary edema. Patient placed on IV nitroglycerin for treatment of pulmonary edema and hypertension.  Initial EKG revealed narrow complex tachycardia, unclear rhythm. He was given Cardizem with some improvement of his heart rate. As nitroglycerin took effect and his blood pressure improved and his respiratory distress improved with BiPAP, heart rate has continued to improve. He is now in a sinus tachycardia around 110 bpm, much comfortable.  Patient does have mild hyperglycemia without acidosis, no evidence of DKA. Initial troponin negative. Repeat EKGs did not show any evidence of infarct or ischemia at this time.  As patient's respiratory distress improved, blood pressure continued to improve and he was able to be weaned off of the nitroglycerin. He continues to do well on BiPAP. Patient will require hospitalization for further management.  CRITICAL CARE Performed by: Gilda Crease   Total critical care time: 35 minutes  Critical care time was exclusive of separately billable procedures and treating other patients.  Critical care was necessary to treat or prevent imminent or life-threatening deterioration.  Critical care was time spent personally by me on the following activities: development of treatment plan with patient and/or surrogate as well as nursing, discussions with consultants, evaluation of patient's response to treatment, examination of patient, obtaining history from patient or surrogate, ordering and performing treatments and interventions, ordering and review of laboratory studies, ordering and review of radiographic studies, pulse oximetry and re-evaluation of patient's condition.   Final  Clinical Impressions(s) / ED Diagnoses   Final diagnoses:  Hypertensive urgency  Acute on chronic congestive heart failure, unspecified heart failure type [facility])    New Prescriptions New Prescriptions   No medications on file  I personally performed the services described in this documentation, which was scribed in my presence. The recorded information has been reviewed and is accurate.    Gilda Crease, MD 11/01/16  1610    Gilda Crease, MD 11/01/16 [phone]

## 2016-10-31 NOTE — ED Triage Notes (Signed)
Patient to the ER on CPAP. Complaining of difficulty breathing. Patient diaphoretic.  MD and respiratory at bedside.

## 2016-11-01 ENCOUNTER — Other Ambulatory Visit: Payer: Self-pay

## 2016-11-01 DIAGNOSIS — Z7901 Long term (current) use of anticoagulants: Secondary | ICD-10-CM | POA: Diagnosis not present

## 2016-11-01 DIAGNOSIS — I10 Essential (primary) hypertension: Secondary | ICD-10-CM

## 2016-11-01 DIAGNOSIS — I509 Heart failure, unspecified: Secondary | ICD-10-CM

## 2016-11-01 DIAGNOSIS — F17210 Nicotine dependence, cigarettes, uncomplicated: Secondary | ICD-10-CM | POA: Diagnosis present

## 2016-11-01 DIAGNOSIS — E1143 Type 2 diabetes mellitus with diabetic autonomic (poly)neuropathy: Secondary | ICD-10-CM | POA: Diagnosis present

## 2016-11-01 DIAGNOSIS — Z79899 Other long term (current) drug therapy: Secondary | ICD-10-CM | POA: Diagnosis not present

## 2016-11-01 DIAGNOSIS — I69391 Dysphagia following cerebral infarction: Secondary | ICD-10-CM | POA: Diagnosis not present

## 2016-11-01 DIAGNOSIS — I513 Intracardiac thrombosis, not elsewhere classified: Secondary | ICD-10-CM | POA: Diagnosis present

## 2016-11-01 DIAGNOSIS — I160 Hypertensive urgency: Secondary | ICD-10-CM | POA: Diagnosis present

## 2016-11-01 DIAGNOSIS — Z955 Presence of coronary angioplasty implant and graft: Secondary | ICD-10-CM | POA: Diagnosis not present

## 2016-11-01 DIAGNOSIS — J811 Chronic pulmonary edema: Secondary | ICD-10-CM | POA: Diagnosis present

## 2016-11-01 DIAGNOSIS — J810 Acute pulmonary edema: Secondary | ICD-10-CM

## 2016-11-01 DIAGNOSIS — I214 Non-ST elevation (NSTEMI) myocardial infarction: Secondary | ICD-10-CM | POA: Diagnosis present

## 2016-11-01 DIAGNOSIS — E785 Hyperlipidemia, unspecified: Secondary | ICD-10-CM | POA: Diagnosis present

## 2016-11-01 DIAGNOSIS — Z951 Presence of aortocoronary bypass graft: Secondary | ICD-10-CM | POA: Diagnosis not present

## 2016-11-01 DIAGNOSIS — I5043 Acute on chronic combined systolic (congestive) and diastolic (congestive) heart failure: Secondary | ICD-10-CM

## 2016-11-01 DIAGNOSIS — R0602 Shortness of breath: Secondary | ICD-10-CM | POA: Diagnosis present

## 2016-11-01 DIAGNOSIS — Z9114 Patient's other noncompliance with medication regimen: Secondary | ICD-10-CM | POA: Diagnosis not present

## 2016-11-01 DIAGNOSIS — J439 Emphysema, unspecified: Secondary | ICD-10-CM | POA: Diagnosis not present

## 2016-11-01 DIAGNOSIS — Z7982 Long term (current) use of aspirin: Secondary | ICD-10-CM | POA: Diagnosis not present

## 2016-11-01 DIAGNOSIS — I340 Nonrheumatic mitral (valve) insufficiency: Secondary | ICD-10-CM | POA: Diagnosis present

## 2016-11-01 DIAGNOSIS — K219 Gastro-esophageal reflux disease without esophagitis: Secondary | ICD-10-CM | POA: Diagnosis present

## 2016-11-01 DIAGNOSIS — J9601 Acute respiratory failure with hypoxia: Secondary | ICD-10-CM | POA: Diagnosis present

## 2016-11-01 DIAGNOSIS — I2510 Atherosclerotic heart disease of native coronary artery without angina pectoris: Secondary | ICD-10-CM | POA: Diagnosis present

## 2016-11-01 DIAGNOSIS — I110 Hypertensive heart disease with heart failure: Secondary | ICD-10-CM | POA: Diagnosis present

## 2016-11-01 DIAGNOSIS — J449 Chronic obstructive pulmonary disease, unspecified: Secondary | ICD-10-CM | POA: Diagnosis present

## 2016-11-01 DIAGNOSIS — R4701 Aphasia: Secondary | ICD-10-CM | POA: Diagnosis not present

## 2016-11-01 DIAGNOSIS — I6330 Cerebral infarction due to thrombosis of unspecified cerebral artery: Secondary | ICD-10-CM | POA: Diagnosis not present

## 2016-11-01 DIAGNOSIS — I252 Old myocardial infarction: Secondary | ICD-10-CM | POA: Diagnosis not present

## 2016-11-01 DIAGNOSIS — K3184 Gastroparesis: Secondary | ICD-10-CM | POA: Diagnosis present

## 2016-11-01 DIAGNOSIS — I255 Ischemic cardiomyopathy: Secondary | ICD-10-CM | POA: Diagnosis present

## 2016-11-01 DIAGNOSIS — Z794 Long term (current) use of insulin: Secondary | ICD-10-CM | POA: Diagnosis not present

## 2016-11-01 LAB — CBC
HCT: 42.1 % (ref 39.0–52.0)
Hemoglobin: 14.4 g/dL (ref 13.0–17.0)
MCH: 30.9 pg (ref 26.0–34.0)
MCHC: 34.4 g/dL (ref 30.0–36.0)
MCV: 89.6 fL (ref 78.0–100.0)
PLATELETS: 129 10*3/uL — AB (ref 150–400)
RBC: 4.7 MIL/uL (ref 4.22–5.81)
RDW: 14.3 % (ref 11.5–15.5)
WBC: 14.6 10*3/uL — AB (ref 4.0–10.5)

## 2016-11-01 LAB — GLUCOSE, CAPILLARY
GLUCOSE-CAPILLARY: 106 mg/dL — AB (ref 65–99)
GLUCOSE-CAPILLARY: 184 mg/dL — AB (ref 65–99)
Glucose-Capillary: 370 mg/dL — ABNORMAL HIGH (ref 65–99)
Glucose-Capillary: 163 mg/dL — ABNORMAL HIGH (ref 65–99)

## 2016-11-01 LAB — COMPREHENSIVE METABOLIC PANEL
ALBUMIN: 3.3 g/dL — AB (ref 3.5–5.0)
ALT: 31 U/L (ref 17–63)
AST: 58 U/L — AB (ref 15–41)
Alkaline Phosphatase: 70 U/L (ref 38–126)
Anion gap: 12 (ref 5–15)
BUN: 19 mg/dL (ref 6–20)
CHLORIDE: 99 mmol/L — AB (ref 101–111)
CO2: 22 mmol/L (ref 22–32)
CREATININE: 1.47 mg/dL — AB (ref 0.61–1.24)
Calcium: 9.2 mg/dL (ref 8.9–10.3)
GFR calc Af Amer: >60 mL/min (ref >60)
GFR, EST NON AFRICAN AMERICAN: 53 mL/min — AB (ref >60)
GLUCOSE: 388 mg/dL — AB (ref 65–99)
POTASSIUM: 4 mmol/L (ref 3.5–5.1)
Sodium: 133 mmol/L — ABNORMAL LOW (ref 135–145)
Total Bilirubin: 0.5 mg/dL (ref 0.3–1.2)
Total Protein: 5.9 g/dL — ABNORMAL LOW (ref 6.5–8.1)

## 2016-11-01 LAB — HEPARIN LEVEL (UNFRACTIONATED)

## 2016-11-01 LAB — BRAIN NATRIURETIC PEPTIDE: B Natriuretic Peptide: 732 pg/mL — ABNORMAL HIGH (ref 0.0–100.0)

## 2016-11-01 LAB — MRSA PCR SCREENING: MRSA BY PCR: NEGATIVE

## 2016-11-01 LAB — TROPONIN I
TROPONIN I: 19.48 ng/mL — AB (ref <0.03)
Troponin I: 3.96 ng/mL (ref <0.03)

## 2016-11-01 LAB — APTT: APTT: 32 s (ref 24–36)

## 2016-11-01 MED ORDER — HEPARIN BOLUS VIA INFUSION
4000.0000 [IU] | Freq: Once | INTRAVENOUS | Status: AC
  Administered 2016-11-01: 4000 [IU] via INTRAVENOUS
  Filled 2016-11-01: qty 4000

## 2016-11-01 MED ORDER — INSULIN ASPART 100 UNIT/ML ~~LOC~~ SOLN: 0.0000 [IU] | Freq: Every day | SUBCUTANEOUS | Status: DC

## 2016-11-01 MED ORDER — HYDRALAZINE HCL 25 MG PO TABS: 25.0000 mg | ORAL_TABLET | Freq: Four times a day (QID) | ORAL | Status: DC | PRN

## 2016-11-01 MED ORDER — SODIUM CHLORIDE 0.9 % IV SOLN: 250.0000 mL | INTRAVENOUS | Status: DC | PRN

## 2016-11-01 MED ORDER — ONDANSETRON HCL 4 MG/2ML IJ SOLN: 4.0000 mg | Freq: Four times a day (QID) | INTRAMUSCULAR | Status: DC | PRN

## 2016-11-01 MED ORDER — ACETAMINOPHEN 325 MG PO TABS: 650.0000 mg | ORAL_TABLET | ORAL | Status: DC | PRN

## 2016-11-01 MED ORDER — NIFEDIPINE ER OSMOTIC RELEASE 30 MG PO TB24
60.0000 mg | ORAL_TABLET | Freq: Every day | ORAL | Status: DC
  Administered 2016-11-01: 60 mg via ORAL
  Filled 2016-11-01: qty 2

## 2016-11-01 MED ORDER — SODIUM CHLORIDE 0.9% FLUSH: 3.0000 mL | INTRAVENOUS | Status: DC | PRN

## 2016-11-01 MED ORDER — HALOPERIDOL LACTATE 5 MG/ML IJ SOLN: 5.0000 mg | Freq: Once | INTRAMUSCULAR | Status: DC

## 2016-11-01 MED ORDER — INSULIN ASPART 100 UNIT/ML ~~LOC~~ SOLN
0.0000 [IU] | Freq: Three times a day (TID) | SUBCUTANEOUS | Status: DC
  Administered 2016-11-01: 9 [IU] via SUBCUTANEOUS

## 2016-11-01 MED ORDER — CARVEDILOL 12.5 MG PO TABS
12.5000 mg | ORAL_TABLET | Freq: Two times a day (BID) | ORAL | Status: DC
  Administered 2016-11-01 – 2016-11-04 (×7): 12.5 mg via ORAL
  Filled 2016-11-01 (×7): qty 1

## 2016-11-01 MED ORDER — INSULIN GLARGINE 100 UNIT/ML ~~LOC~~ SOLN
20.0000 [IU] | Freq: Every day | SUBCUTANEOUS | Status: DC
  Administered 2016-11-01 – 2016-11-03 (×3): 20 [IU] via SUBCUTANEOUS
  Filled 2016-11-01 (×4): qty 0.2

## 2016-11-01 MED ORDER — SODIUM CHLORIDE 0.9% FLUSH
3.0000 mL | Freq: Two times a day (BID) | INTRAVENOUS | Status: DC
  Administered 2016-11-01 – 2016-11-03 (×6): 3 mL via INTRAVENOUS

## 2016-11-01 MED ORDER — ASPIRIN EC 81 MG PO TBEC
81.0000 mg | DELAYED_RELEASE_TABLET | Freq: Every day | ORAL | Status: DC
  Administered 2016-11-01 – 2016-11-04 (×3): 81 mg via ORAL
  Filled 2016-11-01 (×4): qty 1

## 2016-11-01 MED ORDER — GABAPENTIN 400 MG PO CAPS
400.0000 mg | ORAL_CAPSULE | Freq: Two times a day (BID) | ORAL | Status: DC
  Administered 2016-11-01 – 2016-11-04 (×7): 400 mg via ORAL
  Filled 2016-11-01 (×7): qty 1

## 2016-11-01 MED ORDER — RIVAROXABAN 20 MG PO TABS: 20.0000 mg | ORAL_TABLET | Freq: Every day | ORAL | Status: DC

## 2016-11-01 MED ORDER — LISINOPRIL 40 MG PO TABS
40.0000 mg | ORAL_TABLET | Freq: Every day | ORAL | Status: DC
  Administered 2016-11-01 – 2016-11-04 (×4): 40 mg via ORAL
  Filled 2016-11-01: qty 4
  Filled 2016-11-01 (×3): qty 1

## 2016-11-01 MED ORDER — ASPIRIN EC 81 MG PO TBEC: 81.0000 mg | DELAYED_RELEASE_TABLET | Freq: Every day | ORAL | Status: DC

## 2016-11-01 MED ORDER — ACETAMINOPHEN 650 MG RE SUPP: 650.0000 mg | Freq: Four times a day (QID) | RECTAL | Status: DC | PRN

## 2016-11-01 MED ORDER — ACETAMINOPHEN 325 MG PO TABS: 650.0000 mg | ORAL_TABLET | Freq: Four times a day (QID) | ORAL | Status: DC | PRN

## 2016-11-01 MED ORDER — HEPARIN (PORCINE) IN NACL 100-0.45 UNIT/ML-% IJ SOLN
1500.0000 [IU]/h | INTRAMUSCULAR | Status: DC
  Administered 2016-11-01: 1400 [IU]/h via INTRAVENOUS
  Filled 2016-11-01: qty 250

## 2016-11-01 MED ORDER — SODIUM CHLORIDE 0.9% FLUSH
3.0000 mL | Freq: Two times a day (BID) | INTRAVENOUS | Status: DC
  Administered 2016-11-01 (×2): 3 mL via INTRAVENOUS

## 2016-11-01 MED ORDER — INSULIN ASPART 100 UNIT/ML ~~LOC~~ SOLN
0.0000 [IU] | Freq: Three times a day (TID) | SUBCUTANEOUS | Status: DC
Start: 2016-11-01 — End: 2016-11-04
  Administered 2016-11-01: 4 [IU] via SUBCUTANEOUS
  Administered 2016-11-02: 7 [IU] via SUBCUTANEOUS
  Administered 2016-11-02 – 2016-11-03 (×3): 4 [IU] via SUBCUTANEOUS
  Administered 2016-11-03: 3 [IU] via SUBCUTANEOUS
  Administered 2016-11-04: 4 [IU] via SUBCUTANEOUS

## 2016-11-01 MED ORDER — ASPIRIN 81 MG PO CHEW
81.0000 mg | CHEWABLE_TABLET | ORAL | Status: AC
  Administered 2016-11-02: 81 mg via ORAL
  Filled 2016-11-01: qty 1

## 2016-11-01 MED ORDER — NITROGLYCERIN 0.4 MG SL SUBL: 0.4000 mg | SUBLINGUAL_TABLET | SUBLINGUAL | Status: DC | PRN

## 2016-11-01 MED ORDER — FUROSEMIDE 10 MG/ML IJ SOLN
20.0000 mg | Freq: Two times a day (BID) | INTRAMUSCULAR | Status: DC
  Administered 2016-11-01: 20 mg via INTRAVENOUS
  Filled 2016-11-01: qty 2

## 2016-11-01 MED ORDER — ATORVASTATIN CALCIUM 40 MG PO TABS
40.0000 mg | ORAL_TABLET | Freq: Every day | ORAL | Status: DC
  Administered 2016-11-01 – 2016-11-03 (×3): 40 mg via ORAL
  Filled 2016-11-01 (×3): qty 1

## 2016-11-01 MED ORDER — LORAZEPAM 2 MG/ML IJ SOLN
1.0000 mg | Freq: Once | INTRAMUSCULAR | Status: AC
  Administered 2016-11-01: 1 mg via INTRAVENOUS

## 2016-11-01 MED ORDER — SODIUM CHLORIDE 0.9 % IV SOLN
INTRAVENOUS | Status: DC
  Administered 2016-11-02: 06:00:00 via INTRAVENOUS

## 2016-11-01 MED ORDER — LORAZEPAM 2 MG/ML IJ SOLN
INTRAMUSCULAR | Status: AC
  Filled 2016-11-01: qty 1

## 2016-11-01 MED ORDER — ONDANSETRON HCL 4 MG PO TABS: 4.0000 mg | ORAL_TABLET | Freq: Four times a day (QID) | ORAL | Status: DC | PRN

## 2016-11-01 NOTE — Progress Notes (Signed)
[id number] [facility] and report given to nurse receiving patient. [facility] notified.

## 2016-11-01 NOTE — Progress Notes (Signed)
Troponin called 19.48

## 2016-11-01 NOTE — Progress Notes (Signed)
Asked by cath lab to clarify last dose of Xarelto - nursing secretary spoke with patient, who stated he last took it Tuesday night. Dayna Dunn PA-C

## 2016-11-01 NOTE — ED Notes (Signed)
Admitting Provider at bedside. 

## 2016-11-01 NOTE — ED Provider Notes (Signed)
Called to the room by nursing staff. Patient has taken his BiPAP mask off and is refusing to replace it. He is becoming more dyspneic and hypoxic. Patient very agitated. Will administer Haldol and Ativan to facilitate continued BiPAP for respiratory failure.   Gilda Creasehristopher J Pollina, MD 11/01/16 [phone]

## 2016-11-01 NOTE — Progress Notes (Signed)
1132 Patient leaving via stretcher with [facility] in [facility] now

## 2016-11-01 NOTE — ED Provider Notes (Signed)
Medical screening exam. Patient was brought in by ambulance short of breath and on BiPAP. He was tachycardic over 180 possibly in atrial fib. He was hypertensive severely. Patient was given Lasix, Cardizem IV and hydralazine. Patient was also initially put on nitroglycerin drip for possible severe congestive heart failure. Care of patient was taken over by Dr. Velna OchsPolina   Clester Chlebowski, MD 11/01/16 [phone]

## 2016-11-01 NOTE — Progress Notes (Signed)
CRITICAL VALUE ALERT  Critical value received:  Troponin 3.96   Date of notification:  11-01-16  Time of notification: 0650  Critical value read back: Yes  Nurse who received alert: Fatima SangerFred Cedrick Partain   MD notified (1st page): Dr. Sharl MaLama  Time of first page:    MD notified (2nd page):  Time of second page:   Responding MD:  MD Sharl MaLama  Time MD responded:    Stat EKG, [facility]

## 2016-11-01 NOTE — H&P (Signed)
TRH H&P    Patient Demographics:    Edwin White, is a 51 y.o. male  MRN: [id number]  DOB - 1965/09/14  Admit Date - 10/31/2016  Referring MD/NP/PA: Dr Blinda Leatherwood  Outpatient Primary MD for the patient is Nils Pyle, MD  Patient coming from: Home  Chief Complaint  Patient presents with  . Shortness of Breath      HPI:    Edwin White  is a 51 y.o. male, With history of hypertension, apical thrombus on anticoagulation with Xarelto, diabetes mellitus was brought to hospital by EMS with chief complaint of shortness of breath. Patient was found to be tachycardic with heart rate in 180s, hypertensive. He was put on BiPAP, and given Lasix, Cardizem, hydralazine. Patient blood pressure improved. Patient is a poor historian, denies chest pain, complains of shortness of breath. No nausea vomiting or diarrhea. Chest x-ray showed congestive heart failure with interstitial edema.    Review of systems:      A full 10 point Review of Systems was done, except as stated above, all other Review of Systems were negative.   With Past History of the following :    Past Medical History:  Diagnosis Date  . CHF (congestive heart failure) (HCC)    No recurrent admissions for heart failure.  . Coronary artery disease    Status post coronary bypass grafting in 2007  . Crescendo transient ischemic attacks   . Functional bowel disorder    Bloating and abdominal cramping as well as constipation requiring lubiprostone  . Gastroparesis    Gastric emptying study scheduled  . GERD (gastroesophageal reflux disease)   . Heart attack [facility]) September 13, 2004   Followed by bypass surgery  . History of medication noncompliance   . Hyperlipidemia   . Hypertension    Poorly controlled  . Ischemic cardiomyopathy    Ejection fraction improved from 15%-20% to 40%-45% in 2012 by echocardiogram  . Moderate mitral regurgitation  by prior echocardiogram   . NSTEMI (non-ST elevated myocardial infarction) (HCC) 12/31/2013  . Post-infarction apical thrombus (HCC)    Anticoagulation discontinued  . Stroke [facility]) 04/2012   denies residual on 12/31/2013  . Type 2 diabetes mellitus (HCC)       Past Surgical History:  Procedure Laterality Date  . CORONARY ANGIOPLASTY WITH STENT PLACEMENT  2006  . CORONARY ARTERY BYPASS GRAFT  2007   "CABG X5"  . LEFT HEART CATHETERIZATION WITH CORONARY/GRAFT ANGIOGRAM N/A 01/04/2014   Procedure: LEFT HEART CATHETERIZATION WITH Isabel Caprice;  Surgeon: Kathleene Hazel, MD;  Location: [facility] CATH LAB;  Service: Cardiovascular;  Laterality: N/A;  . PERCUTANEOUS CORONARY STENT INTERVENTION (PCI-S)  01/04/2014   Procedure: PERCUTANEOUS CORONARY STENT INTERVENTION (PCI-S);  Surgeon: Kathleene Hazel, MD;  Location: [facility] CATH LAB;  Service: Cardiovascular;;      Social History:      Social History  Substance Use Topics  . Smoking status: Current Every Day Smoker    Packs/day: 1.00    Years: 36.00    Types: Cigarettes  Start date: 05/05/1979  . Smokeless tobacco: Never Used  . Alcohol use No     Comment: "quit drinking alcohol in 2006"       Family History :     Family History  Problem Relation Age of Onset  . Adopted: Yes  . Coronary artery disease Father   . Cancer Mother       Home Medications:   Prior to Admission medications   Medication Sig Start Date End Date Taking? Authorizing Provider  aspirin 81 MG EC tablet TAKE 1 TABLET BY MOUTH EVERY DAY 05/29/16   Elige RadonJoshua A Dettinger, MD  atorvastatin (LIPITOR) 40 MG tablet Take 1 tablet (40 mg total) by mouth daily. 05/02/16   Elige RadonJoshua A Dettinger, MD  Blood Glucose Monitoring Suppl (ACCU-CHEK AVIVA) device Use as instructed 01/09/16 01/08/17  Elige RadonJoshua A Dettinger, MD  canagliflozin [facility](INVOKANA) 100 MG TABS tablet Take 1 tablet (100 mg total) by mouth daily before breakfast. 04/11/16   Elige RadonJoshua A Dettinger, MD    carvedilol (COREG) 12.5 MG tablet TAKE 1 TABLET BY MOUTH TWICE DAILY 10/19/16   Laqueta LindenSuresh A Koneswaran, MD  furosemide (LASIX) 80 MG tablet TAKE 1 TABLET BY MOUTH DAILY 06/22/16   Laqueta LindenSuresh A Koneswaran, MD  gabapentin (NEURONTIN) 400 MG capsule Take 1 capsule (400 mg total) by mouth 2 (two) times daily. 04/11/16   Elige RadonJoshua A Dettinger, MD  gabapentin (NEURONTIN) 400 MG capsule TAKE 1 CAPSULE BY MOUTH TWICE DAILY 08/27/16   Elige RadonJoshua A Dettinger, MD  Insulin Glargine (LANTUS SOLOSTAR) 100 UNIT/ML Solostar Pen Inject 20 Units into the skin daily at 10 pm. 08/24/16   Elige RadonJoshua A Dettinger, MD  Linagliptin-Metformin HCl (JENTADUETO) 2.10-998 MG TABS Take 1 tablet by mouth daily.    Historical Provider, MD  lisinopril (PRINIVIL,ZESTRIL) 40 MG tablet Take 1 tablet (40 mg total) by mouth daily. 04/11/16   Elige RadonJoshua A Dettinger, MD  NIFEdipine (PROCARDIA-XL/ADALAT CC) 60 MG 24 hr tablet TAKE 1 TABLET (60 MG TOTAL) BY MOUTH DAILY. 05/29/16   Elige RadonJoshua A Dettinger, MD  nitroGLYCERIN (NITROSTAT) 0.4 MG SL tablet Place 1 tablet (0.4 mg total) under the tongue every 5 (five) minutes x 3 doses as needed for chest pain. Patient not taking: Reported on 08/15/2016 01/05/14   Brittainy Sherlynn CarbonM Simmons, PA-C  potassium chloride SA (K-DUR,KLOR-CON) 20 MEQ tablet TAKE 1 TABLET BY MOUTH DAILY 06/22/16   Laqueta LindenSuresh A Koneswaran, MD  rivaroxaban (XARELTO) 20 MG TABS tablet Take 1 tablet (20 mg total) by mouth daily with supper. 02/20/16   Jodelle GrossKathryn M Lawrence, NP     Allergies:     Allergies  Allergen Reactions  . Pravastatin Other (See Comments)    PT CANNOT NOT WALK OR MOVE.     Physical Exam:   Vitals  Blood pressure 118/78, pulse (!) 119, resp. rate (!) 25, weight 101.2 kg (223 lb), SpO2 100 %.  1.  General: Appears in moderate respiratory distress is  2. Psychiatric:  Intact judgement and  insight, awake alert, oriented x 3.  3. Neurologic: No focal neurological deficits, all cranial nerves intact.Strength 5/5 all 4 extremities,  sensation intact all 4 extremities, plantars down going.  4. Eyes :  anicteric sclerae, moist conjunctivae with no lid lag. PERRLA.  5. ENMT:  Oropharynx clear with moist mucous membranes and good dentition  6. Neck:  supple, no cervical lymphadenopathy appriciated, No thyromegaly  7. Respiratory : Normal respiratory effort, clear to auscultation bilaterally  8. Cardiovascular : RRR, no gallops, rubs or murmurs, no leg  edema  9. Gastrointestinal:  Positive bowel sounds, abdomen soft, non-tender to palpation,no hepatosplenomegaly, no rigidity or guarding       10. Skin:  No cyanosis, normal texture and turgor, no rash, lesions or ulcers  11.Musculoskeletal:  Good muscle tone,  joints appear normal , no effusions,  normal range of motion    Data Review:    CBC  Recent Labs Lab 10/31/16 2240 10/31/16 2249  WBC 16.9*  --   HGB 17.4* 18.4*  HCT 50.1 54.0*  PLT 189  --   MCV 91.1  --   MCH 31.6  --   MCHC 34.7  --   RDW 14.3  --   LYMPHSABS 9.7*  --   MONOABS 0.9  --   EOSABS 0.2  --   BASOSABS 0.1  --    ------------------------------------------------------------------------------------------------------------------  Chemistries   Recent Labs Lab 10/31/16 2240 10/31/16 2249  NA 135 135  K 3.8 3.9  CL 98* 100*  CO2 23  --   GLUCOSE 357* 362*  BUN 15 16  CREATININE 1.34* 1.20  CALCIUM 9.7  --   AST 51*  --   ALT 32  --   ALKPHOS 111  --   BILITOT 0.6  --    ------------------------------------------------------------------------------------------------------------------  ------------------------------------------------------------------------------------------------------------------ GFR: Estimated Creatinine Clearance: 88.6 mL/min (by C-G formula based on SCr of 1.2 mg/dL). Liver Function Tests:  Recent Labs Lab 10/31/16 2240  AST 51*  ALT 32  ALKPHOS 111  BILITOT 0.6  PROT 7.5  ALBUMIN 4.0     Recent Labs Lab 10/31/16 2232  GLUCAP  355*   Lipid Profile:  --------------------------------------------------------------------------------------------------------------- Urine analysis:    Component Value Date/Time   COLORURINE YELLOW 02/06/2016 0655   APPEARANCEUR CLEAR 02/06/2016 0655   LABSPEC 1.020 02/06/2016 0655   PHURINE 6.0 02/06/2016 0655   GLUCOSEU 250 (A) 02/06/2016 0655   HGBUR NEGATIVE 02/06/2016 0655   BILIRUBINUR NEGATIVE 02/06/2016 0655   KETONESUR NEGATIVE 02/06/2016 0655   PROTEINUR 30 (A) 02/06/2016 0655   NITRITE NEGATIVE 02/06/2016 0655   LEUKOCYTESUR NEGATIVE 02/06/2016 0655      Imaging Results:    Dg Chest Portable 1 View  Result Date: 10/31/2016 CLINICAL DATA:  Dyspnea and wheezing tonight.  Diaphoresis. EXAM: PORTABLE CHEST 1 VIEW COMPARISON:  04/08/2015 FINDINGS: Unchanged moderate cardiomegaly. New perihilar airspace opacity, likely alveolar edema. Worsened vascular and interstitial congestive changes. No large pleural effusions. IMPRESSION: Congestive heart failure with interstitial and alveolar edema. Electronically Signed   By: Ellery Plunk M.D.   On: 10/31/2016 23:22    My personal review of EKG: Sinus tachycardia, repeat EKG showed T-wave inversions in leads 1 aVL, V6. 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 Pulmonary edema- patient was given 80 mg IV Lasix in the ED, breathing has improved. Continue BiPAP when necessary. We'll start Lasix 20 mg IV every 12 hours. Intake and output. 2. Hypertensive urgency-blood pressure improved after patient given Cardizem, Lasix, hydralazine. Patient initially also was started on nitroglycerin drip which was discontinued. Continue Coreg 12.5 mg twice a day, nifedipine XL 60 mg daily. 3. Apical mural  thrombus- continue Xarelto 20 mg by mouth daily 4. History of CVA-stable, no new neurological changes. Continue Xarelto 5. Diabetes mellitus-  continue Lantus, will start sliding scale insulin with NovoLog. Hold linagliptin/metformin at this time.    DVT Prophylaxis-   Xarelto  AM Labs Ordered, also please review Full Orders  Family Communication: Admission, patients condition and plan of care including tests being ordered have been discussed with the patient* who indicate understanding and agree with the plan and Code Status.  Code Status:  Full code  Admission status: Observation    Time spent in minutes : 60 minutes   LAMA,GAGAN S M.D on 11/01/2016 at 3:01 AM  Between 7am to 7pm - Pager - [phone]. After 7pm go to [email or web address] - password [facility]  [facility] - Office  [phone]

## 2016-11-01 NOTE — Progress Notes (Signed)
[facility], called to give report to nurse receiving this patient, number left for nurse to call this writer back.

## 2016-11-01 NOTE — Progress Notes (Signed)
ANTICOAGULATION CONSULT NOTE - Initial Consult  Pharmacy Consult for Heparin Indication: ACS / STEMI  Allergies  Allergen Reactions  . Pravastatin Other (See Comments)    PT CANNOT NOT WALK OR MOVE.    Patient Measurements: Height: 6\' 1"  (185.4 cm) Weight: 223 lb 12.3 oz (101.5 kg) IBW/kg (Calculated) : 79.9 Heparin Dosing Weight: 100 kg  Vital Signs: Temp: 98.3 F (36.8 C) (05/03 1200) Temp Source: Oral (05/03 1200) BP: 102/71 (05/03 1200) Pulse Rate: 89 (05/03 1200)  Labs:  Recent Labs  10/31/16 2240 10/31/16 2249 11/01/16 0406 11/01/16 0433 11/01/16 1123  HGB 17.4* 18.4* 14.4  --   --   HCT 50.1 54.0* 42.1  --   --   PLT 189  --  129*  --   --   CREATININE 1.34* 1.20 1.47*  --   --   TROPONINI  --   --   --  3.96* 19.48*    Estimated Creatinine Clearance: 73.6 mL/min (A) (by C-G formula based on SCr of 1.47 mg/dL (H)).   Medical History: Past Medical History:  Diagnosis Date  . CHF (congestive heart failure) (HCC)    No recurrent admissions for heart failure.  . Coronary artery disease    Status post coronary bypass grafting in 2007  . Crescendo transient ischemic attacks   . Functional bowel disorder    Bloating and abdominal cramping as well as constipation requiring lubiprostone  . Gastroparesis    Gastric emptying study scheduled  . GERD (gastroesophageal reflux disease)   . Heart attack [facility](HCC) September 13, 2004   Followed by bypass surgery  . History of medication noncompliance   . Hyperlipidemia   . Hypertension    Poorly controlled  . Ischemic cardiomyopathy    Ejection fraction improved from 15%-20% to 40%-45% in 2012 by echocardiogram  . Moderate mitral regurgitation by prior echocardiogram   . NSTEMI (non-ST elevated myocardial infarction) (HCC) 12/31/2013  . Post-infarction apical thrombus (HCC)    Anticoagulation discontinued  . Stroke [facility](HCC) 04/2012   denies residual on 12/31/2013  . Type 2 diabetes mellitus (HCC)     Medications:  PTA  Medication reconcilation pending. He reports he has not taken it or any other medications in over a week.    Assessment:  51 y.o. male, with history of HTN,  apical thrombus on anticoagulation with Xarelto, diabetes mellitus was brought to hospital by EMS with chief complaint of shortness of breath.  Patient was on Xarelto 20mg  daily PTA>but he reports he has not taken it or any other medications in over a week.  I ordered a STAT heparin level and aPTT.  Heparin level = <0.1 and aPTT 32 sec. Corresponds to not taking Xarelto in over 1 week.  Pltc 189<129k, Hgb 18.4>14.4  Goal of Therapy:  Heparin level = 0.3-0.7 units/ml Monitor platelets by anticoagulation protocol: Yes   Plan:  Heparin 4000 units bolus x1  Heparin drip 1400 units/hr 6 hour heparin level Daily  HL, CBC.   Thank you for allowing pharmacy to be part of this patients care team.   Edwin White, RPh Clinical Pharmacist Pager: [phone] Main Pharmacy [phone] 11/01/2016 5:52 PM

## 2016-11-01 NOTE — Progress Notes (Addendum)
Called by RN that patient troponin went up to 3.96. I examined the patient, he denies any chest pain. No shortness of breath. I called and discussed with [facility] Mayford Knifeurner, who recommends patient to be  [facility]. Patient is already on Xarelto for anticoagulation, so no anti-coagulation recommended.  Repeat EKG this morning again shows T-wave inversions in lead 1 aVL V5 V6 which is unchanged from previous EKG done last night.

## 2016-11-01 NOTE — H&P (Signed)
History & Physical    Patient ID: Edwin White MRN: [id number], DOB/AGE: 11-29-1965   Admit date: 10/31/2016   Primary Physician: Elige RadonJoshua A Dettinger, MD Primary Cardiologist: Dr. Purvis SheffieldKoneswaran   History of Present Illness    Edwin White is a 51 y.o. 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 He has been transferred from [facility] for NSTEMI.  The patient developed chest pressure and dyspnea of sudden onset last night. The chest pressure was intermittent for a few minutes at a time. He came in by EMS. In the ED he was found to be tachycardic with heart rate int he 180's and hypertensive. He was placed on BiPap and given IV lasix, cardizem and hydralazine with improvement in his blood pressure. The patient is a poor historian, but he denies any prior exertional chest discomfort or shortness of breath. The patient is not very forthcoming on exam and just wants to eat. His breathing continued to improve and he is now on room air without dyspnea.  CXR showed congestive heart failure and interstitial edema.   Pt has had dysphagia since stroke in 2017. He lives alone, cares for himself and takes his own meds. In speaking to him, not sure how much he understands.   Last seen in the office 02/20/2016 when He was placed on Xarelto for apical thrombus,not felt to be candidate for coumadin due to compliance issues  ______________________________________________________________________________________ Cardiac cath 01/04/2014 Left Ventricular Angiogram: LVEF=10-15% with global hypokinesis. Impression: 1. Severe triple vessel CAD s/p CABG with 2/3  patent grafts. Patent graft to LAD and OM. Circumflex is not protected by graft and has high grade mid stenosis. Diffuse RCA disease with left to right collaterals.  2. NSTEMI 3. Severe LV systolic dysfunction 4. Successful PTCA/bare metal stent mid Circumflex  Recommendations: He has a history of medication non-compliance thus bare metal stent was used. He will need at least one month of dual anti-platelet therapy with ASA and Plavix. He has severe LV dysfunction/ischemic cardiomyopathy. Continue medical therapy. Consider ICD. Smoking cessation.  ________________________________________________________________________________________  Echo 02/06/2016 Study Conclusions  - Left ventricle: The cavity size was moderately dilated. Wall   thickness was normal. Systolic function was severely reduced. The   estimated ejection fraction was in the range of 10% to 15%.   Features are consistent with a pseudonormal left ventricular   filling pattern, with concomitant abnormal relaxation and   increased filling pressure (grade 2 diastolic dysfunction).   Doppler parameters are consistent with high ventricular filling   pressure. - Aortic valve: Valve area (VTI): 2.08 cm^2. Valve area (Vmax):   1.94 cm^2. - Left atrium: The atrium was moderately dilated. - Technically adequate study. Echocontrast was used to enhance   visualization. - There is a 1.4 x 1.1 cm apical thrombus.  Past Medical History    Past Medical History:  Diagnosis Date  . CHF (congestive heart failure) (HCC)    No recurrent admissions for heart failure.  . Coronary artery disease    Status post coronary bypass grafting in 2007  . Crescendo transient ischemic attacks   . Functional bowel disorder    Bloating and abdominal cramping as well  as constipation requiring lubiprostone  . Gastroparesis    Gastric emptying study scheduled  . GERD (gastroesophageal reflux disease)   . Heart attack [facility]) September 13, 2004   Followed by  bypass surgery  . History of medication noncompliance   . Hyperlipidemia   . Hypertension    Poorly controlled  . Ischemic cardiomyopathy    Ejection fraction improved from 15%-20% to 40%-45% in 2012 by echocardiogram  . Moderate mitral regurgitation by prior echocardiogram   . NSTEMI (non-ST elevated myocardial infarction) (HCC) 12/31/2013  . Post-infarction apical thrombus (HCC)    Anticoagulation discontinued  . Stroke [facility]) 04/2012   denies residual on 12/31/2013  . Type 2 diabetes mellitus (HCC)     Past Surgical History:  Procedure Laterality Date  . CORONARY ANGIOPLASTY WITH STENT PLACEMENT  2006  . CORONARY ARTERY BYPASS GRAFT  2007   "CABG X5"  . LEFT HEART CATHETERIZATION WITH CORONARY/GRAFT ANGIOGRAM N/A 01/04/2014   Procedure: LEFT HEART CATHETERIZATION WITH Isabel Caprice;  Surgeon: Kathleene Hazel, MD;  Location: [facility] CATH LAB;  Service: Cardiovascular;  Laterality: N/A;  . PERCUTANEOUS CORONARY STENT INTERVENTION (PCI-S)  01/04/2014   Procedure: PERCUTANEOUS CORONARY STENT INTERVENTION (PCI-S);  Surgeon: Kathleene Hazel, MD;  Location: [facility] CATH LAB;  Service: Cardiovascular;;     Allergies  Allergies  Allergen Reactions  . Pravastatin Other (See Comments)    PT CANNOT NOT WALK OR MOVE.    Home Medications    Prior to Admission medications   Medication Sig Start Date End Date Taking? Authorizing Provider  aspirin 81 MG EC tablet TAKE 1 TABLET BY MOUTH EVERY DAY 05/29/16   Elige Radon Dettinger, MD  atorvastatin (LIPITOR) 40 MG tablet Take 1 tablet (40 mg total) by mouth daily. 05/02/16   Elige Radon Dettinger, MD  Blood Glucose Monitoring Suppl (ACCU-CHEK AVIVA) device Use as instructed 01/09/16 01/08/17  Elige Radon Dettinger, MD  canagliflozin [facility]) 100 MG TABS tablet Take 1 tablet (100 mg total) by mouth daily before breakfast. 04/11/16   Elige Radon Dettinger, MD  carvedilol (COREG) 12.5 MG tablet TAKE 1 TABLET BY MOUTH TWICE DAILY 10/19/16   Laqueta Linden, MD  furosemide (LASIX) 80 MG tablet TAKE 1 TABLET BY MOUTH DAILY 06/22/16   Laqueta Linden, MD  gabapentin (NEURONTIN) 400 MG capsule Take 1 capsule (400 mg total) by mouth 2 (two) times daily. 04/11/16   Elige Radon Dettinger, MD  gabapentin (NEURONTIN) 400 MG capsule TAKE 1 CAPSULE BY MOUTH TWICE DAILY 08/27/16   Elige Radon Dettinger, MD  Insulin Glargine (LANTUS SOLOSTAR) 100 UNIT/ML Solostar Pen Inject 20 Units into the skin daily at 10 pm. 08/24/16   Elige Radon Dettinger, MD  Linagliptin-Metformin HCl (JENTADUETO) 2.10-998 MG TABS Take 1 tablet by mouth daily.    Historical Provider, MD  lisinopril (PRINIVIL,ZESTRIL) 40 MG tablet Take 1 tablet (40 mg total) by mouth daily. 04/11/16   Elige Radon Dettinger, MD  NIFEdipine (PROCARDIA-XL/ADALAT CC) 60 MG 24 hr tablet TAKE 1 TABLET (60 MG TOTAL) BY MOUTH DAILY. 05/29/16   Elige Radon Dettinger, MD  nitroGLYCERIN (NITROSTAT) 0.4 MG SL tablet Place 1 tablet (0.4 mg total) under the tongue every 5 (five) minutes x 3 doses as needed for chest pain. Patient not taking: Reported on 08/15/2016 01/05/14   Brittainy Sherlynn Carbon, PA-C  potassium chloride SA (K-DUR,KLOR-CON) 20 MEQ tablet TAKE 1 TABLET BY MOUTH DAILY 06/22/16   Laqueta Linden, MD  rivaroxaban (XARELTO) 20 MG TABS tablet Take 1  tablet (20 mg total) by mouth daily with supper. 02/20/16   Jodelle Gross, NP    Family History    Family History  Problem Relation Age of Onset  . Adopted: Yes  . Coronary artery disease Father   . Cancer Mother      Social History    Social History   Social History  . Marital status: Legally Separated    Spouse name: N/A  . Number of children: N/A  . Years of education: N/A   Occupational History  . Not on file.   Social History Main Topics  . Smoking status: Current Every Day Smoker    Packs/day: 1.00    Years: 36.00    Types: Cigarettes    Start date: 05/05/1979  . Smokeless tobacco: Never Used  . Alcohol use No     Comment: "quit  drinking alcohol in 2006"  . Drug use: No  . Sexual activity: Not Currently   Other Topics Concern  . Not on file   Social History Narrative   Disabled.      Review of Systems   General:  No chills, fever, night sweats or weight changes.  Cardiovascular:  No chest pain, dyspnea on exertion, edema, orthopnea, palpitations, paroxysmal nocturnal dyspnea. Dermatological: No rash, lesions/masses Respiratory: No cough, dyspnea Urologic: No hematuria, dysuria Abdominal:   No nausea, vomiting, diarrhea, bright red blood per rectum, melena, or hematemesis Neurologic:  No visual changes, wkns, changes in mental status. All other systems reviewed and are otherwise negative except as noted above.  Physical Exam   Blood pressure 102/71, pulse 89, temperature 98.3 F (36.8 C), temperature source Oral, resp. rate (!) 26, height 6\' 1"  (1.854 m), weight 223 lb 12.3 oz (101.5 kg), SpO2 97 %.  General: Well developed, well nourished,male in no acute distress. Not very conversant.  Head: Normocephalic, atraumatic, sclera non-icteric, no xanthomas, nares are without discharge. Neck: No carotid bruits. JVD not elevated.  Lungs: Respirations regular and unlabored, without wheezes or rales.  Heart: Regular rate and rhythm. No S3 or S4.  No murmur, no rubs, or gallops appreciated. Abdomen: Soft, non-tender, non-distended with normoactive bowel sounds. No hepatomegaly. No rebound/guarding. No obvious abdominal masses. Msk:  Strength and tone appear normal for age. No joint deformities or effusions. Extremities: No clubbing or cyanosis. No edema.  Distal pedal pulses are 2+ bilaterally. Neuro: Alert and oriented X 3. Moves all extremities spontaneously. No focal deficits noted. Psych:  Responds to questions appropriately with a normal affect. Skin: No rashes or lesions noted  Labs    Troponin [facility] Test)  Recent Labs  10/31/16 2243  TROPIPOC 0.04    Recent Labs  11/01/16 0433  11/01/16 1123  TROPONINI 3.96* 19.48*   Lab Results  Component Value Date   WBC 14.6 (H) 11/01/2016   HGB 14.4 11/01/2016   HCT 42.1 11/01/2016   MCV 89.6 11/01/2016   PLT 129 (L) 11/01/2016    Recent Labs Lab 11/01/16 0406  NA 133*  K 4.0  CL 99*  CO2 22  BUN 19  CREATININE 1.47*  CALCIUM 9.2  PROT 5.9*  BILITOT 0.5  ALKPHOS 70  ALT 31  AST 58*  GLUCOSE 388*   Lab Results  Component Value Date   CHOL 214 (H) 04/11/2016   HDL 32 (L) 04/11/2016   LDLCALC 129 (H) 04/11/2016   TRIG 264 (H) 04/11/2016   Lab Results  Component Value Date   DDIMER  08/18/2009  0.40        AT THE INHOUSE ESTABLISHED CUTOFF VALUE OF 0.48 ug/mL FEU, THIS ASSAY HAS BEEN DOCUMENTED IN THE LITERATURE TO HAVE A SENSITIVITY AND NEGATIVE PREDICTIVE VALUE OF AT LEAST 98 TO 99%.  THE TEST RESULT SHOULD BE CORRELATED WITH AN ASSESSMENT OF THE CLINICAL PROBABILITY OF DVT / VTE.     B Natriuretic Peptide  Date/Time Value Ref Range Status  10/31/2016 10:50 PM 732.0 (H) 0.0 - 100.0 pg/mL Final  11/21/2014 02:37 PM 194.0 (H) 0.0 - 100.0 pg/mL Final   Pro B Natriuretic peptide (BNP)  Date/Time Value Ref Range Status  08/18/2009 08:10 AM <30.0 0.0 - 100.0 pg/mL Final   No results for input(s): INR in the last 72 hours.    Radiology Studies    Dg Chest Portable 1 View  Result Date: 10/31/2016 CLINICAL DATA:  Dyspnea and wheezing tonight.  Diaphoresis. EXAM: PORTABLE CHEST 1 VIEW COMPARISON:  04/08/2015 FINDINGS: Unchanged moderate cardiomegaly. New perihilar airspace opacity, likely alveolar edema. Worsened vascular and interstitial congestive changes. No large pleural effusions. IMPRESSION: Congestive heart failure with interstitial and alveolar edema. Electronically Signed   By: Ellery Plunk M.D.   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 Placed on BiPap and given IV lasix, cardizem and hydralazine with improvement in his blood pressure. Breathing much better now, on room air. No current chest pain.  -Troponin rose to 19.48 -Start heparin drip. Last dose of Xarelto was Tuesday evening per chart.  -Plan for cardiac cath tomorrow. SCr 1.47. Will give clear liquid breakfast.  -Pt with history of non-compliance, not sure if he has been taking his medications. He has dysphagia after CVA in 2017. Trying to reach family for more information.  -Continue BB, ACE-I, Hold nifedipine. PRN NTG.  Acute on chronic systolic CHF -ischemic cardiomyopathy EF 10-15% 2017 -Will recheck echo to assess function, wall motion -pulmonary edema on CXR and respiratory distress on presentation. Much improved after lasix. -SCr 1.34-->1.47 -No further lasix until after cath, unless he develops more sx of fluid overload.   Apical thrombus  -on Xarelto at home, on hold for cath. Heparin drip. -Will reassess on echo.    Diabetes type 2 on insulin -Last hemoglobin A1c 8.6 on 02/06/2016 -Will check ACHS CBG's and give SSI while here  Hypertension -Continue carvedilol and lisinopril, hydralazine prn.  -Holding nifedipine. Consider increasing carvedilol after cath.   Hyperlipidemia -Last lipid panel 04/11/2016:  TC 214, LDL 129. Not to goal of <70. -Home atorvastatin 40 mg, consider increasing to high dose 80 mg for better lipid control   I spoke with the patient's sister's husband. Sister has no plans to come to the hospital  but will be available by phone later this afternoon and tomorrow morning. The brother-in-law says that the patient has some cognitive difficulty since the stroke and just needs direction. We will discuss condition and plans with  his sister.   SignedBerton Bon, NP-C 11/01/2016, 1:22 PM Pager: [phone]  I have seen and examined the patient along with Berton Bon, NP-C.  I have reviewed the chart, notes and new data.  I agree with NP's note.  Key new complaints: very difficult to obtain history due to what appears to be a mixture of both receptive and expressive aphasia. It appears that he is now comfortable and he denies angina and dyspnea. It seems he had both symptoms last night, but I cannot ascertain which came first. He denies palpitations. It's also hard to be certain whether or not he has taken his meds. He seems he Key examination changes: RRR, a few rales bilaterally, S3 present, no JVD or edema Key new findings / data: ECG showed sinus tachy and frequent PVCs that then changed to ectopic atrial tachycardia at 215-220 bpm. True atrial fibrillation not seen.   PLAN: Currently stable, has completed infarction by enzymes (suspect LCX territory by ECG). No reason for emergency cath, but plan angiography in AM. Apparently he reports he last took Xarelto Tuesday - I could not confirm that with him today. If he did indeed take that medication, it may be best to wait another 24 hours until cath (especially since he may need femoral access for grafts). Meanwhile, trying to reach family. It is very difficult to get informed consent from this patient. Had a disjointed communication with his brother-in-law due to a very poor phone line. His sister did not answer her mobile phone - apparently she is in route, retirning home this PM. Suspect new ischemia was cause of CHF exacerbation, but noncompliance with follow up and meds is a big problem. Arrhythmia was likely secondary to CHF, not  causal. Continue beta-blocker, ACEi, heparin IV, ASA and statin. Previous decision was made to use BMS due to compliance issues.   Thurmon Fair, MD, [facility] [phone] 11/01/2016, 3:21 PM

## 2016-11-01 NOTE — Progress Notes (Signed)
Inpatient Diabetes Program Recommendations  AACE/ADA: New Consensus Statement on Inpatient Glycemic Control (2015)  Target Ranges:  Prepandial:   less than 140 mg/dL      Peak postprandial:   less than 180 mg/dL (1-2 hours)      Critically ill patients:  140 - 180 mg/dL   Results for Edwin White, Edwin White (MRN [id number]) as of 11/01/2016 10:37  Ref. Range 10/31/2016 22:32 11/01/2016 04:16  Glucose-Capillary Latest Ref Range: 65 - 99 mg/dL 409355 (H) 811370 (H)   Review of Glycemic Control  Diabetes history: DM2 Outpatient Diabetes medications: Lantus 20 units QHS, Jentadueto 2.10-998 mg daily, Invokana 100 mg daily Current orders for Inpatient glycemic control: Lantus 20 units QHS, Novolog 0-20 units TID with meals, Novolog 0-5 units QHS  Inpatient Diabetes Program Recommendations: Insulin - Basal: Please consider changing frequency of Lantus to 20 units DAILY so patient can receive Lantus now. Correction (SSI): If patient will remain NPO, please consider changing frequency of CBGs and Novolog to Q4H.   Thanks, Orlando PennerMarie Perris Conwell, RN, MSN, CDE Diabetes Coordinator Inpatient Diabetes Program [phone] (Team Pager from 8am to 5pm)

## 2016-11-01 NOTE — Progress Notes (Signed)
Patient admitted to the hospital earlier this morning by Dr. Sharl MaLama  Patient seen and examined. He denies any chest pain or shortness of breath at this time. Lungs are clear, heart sounds are regular   He was admitted to the hospital with shortness of breath and decompensated CHF. He has a history of combined CHF with EF of 10-15%. He was initially on bipap for respiratory distress, but after receiving lasix, this appears to have improved. Troponin returned positive at 3.96. Dr. Sharl MaLama discussed case with [facility]. He is already on aspirin, statin, BB. He is anticoagulated with Xarelto for apical mural thrombus. At this time, he appears stable. [facility]. Will keep patient NPO for now in case any procedures are planned for today.  MEMON,JEHANZEB

## 2016-11-01 NOTE — ED Notes (Signed)
Pt pulled off Bi-pap mask and is refusing to allow nurse to re-apply. Pt saying he needs to use bathroom. Pt assisted with urinal, pt unable to urinate.  After 5 minutes attempting to urinate with no results, pt says he will allow nurse to re-apply mask. Dr Blinda LeatherwoodPollina aware- new orders received.

## 2016-11-01 NOTE — Progress Notes (Signed)
11/01/2016 1230 Pt received to room 2W01 from [facility] via [facility].  Pt is A&O, no C/O pain or SOB, just requesting something to eat.  Tele monitor applied and CCMD notified.  Oriented to room, call light and bed.  Call bell in reach. Edwin White, Edwin White C

## 2016-11-02 ENCOUNTER — Encounter (HOSPITAL_COMMUNITY): Payer: Self-pay | Admitting: Internal Medicine

## 2016-11-02 ENCOUNTER — Encounter (HOSPITAL_COMMUNITY)
Admission: EM | Disposition: A | Discharge status: Home / Self Care | Payer: Self-pay | Source: Home / Self Care | Attending: Cardiovascular Disease

## 2016-11-02 ENCOUNTER — Inpatient Hospital Stay (HOSPITAL_COMMUNITY): Payer: Medicaid Other

## 2016-11-02 DIAGNOSIS — I509 Heart failure, unspecified: Secondary | ICD-10-CM

## 2016-11-02 DIAGNOSIS — I2510 Atherosclerotic heart disease of native coronary artery without angina pectoris: Secondary | ICD-10-CM

## 2016-11-02 DIAGNOSIS — R4701 Aphasia: Secondary | ICD-10-CM

## 2016-11-02 HISTORY — PX: CORONARY/GRAFT ANGIOGRAPHY: CATH118237

## 2016-11-02 LAB — CBC
HCT: 43.1 % (ref 39.0–52.0)
Hemoglobin: 14.7 g/dL (ref 13.0–17.0)
MCH: 30.4 pg (ref 26.0–34.0)
MCHC: 34.1 g/dL (ref 30.0–36.0)
MCV: 89 fL (ref 78.0–100.0)
PLATELETS: 137 10*3/uL — AB (ref 150–400)
RBC: 4.84 MIL/uL (ref 4.22–5.81)
RDW: 14.7 % (ref 11.5–15.5)
WBC: 10.2 10*3/uL (ref 4.0–10.5)

## 2016-11-02 LAB — ECHOCARDIOGRAM COMPLETE
AVLVOTPG: 1 mmHg
CHL CUP DOP CALC LVOT VTI: 7.99 cm
E/e' ratio: 14.27
FS: 11 % — AB (ref 28–44)
HEIGHTINCHES: 73 in
IVS/LV PW RATIO, ED: 0.73
LA ID, A-P, ES: 51 mm
LA diam end sys: 51 mm
LA vol A4C: 72.4 ml
LA vol index: 34.7 mL/m2
LADIAMINDEX: 2.25 cm/m2
LAVOL: 78.7 mL
LV E/e' medial: 14.27
LV E/e'average: 14.27
LV PW d: 15 mm — AB (ref 0.6–1.1)
LV TDI E'LATERAL: 7.08
LV TDI E'MEDIAL: 4.81
LV e' LATERAL: 7.08 cm/s
LVOTPV: 48.5 cm/s
MV pk A vel: 23 m/s
MV pk E vel: 101 m/s
MVPG: 4 mmHg
RV LATERAL S' VELOCITY: 7.42 cm/s
TAPSE: 15.8 mm
WEIGHTICAEL: 3611.2 [oz_av]

## 2016-11-02 LAB — GLUCOSE, CAPILLARY
GLUCOSE-CAPILLARY: 204 mg/dL — AB (ref 65–99)
Glucose-Capillary: 93 mg/dL (ref 65–99)
Glucose-Capillary: 178 mg/dL — ABNORMAL HIGH (ref 65–99)

## 2016-11-02 LAB — BASIC METABOLIC PANEL
Anion gap: 11 (ref 5–15)
BUN: 18 mg/dL (ref 6–20)
CALCIUM: 9.2 mg/dL (ref 8.9–10.3)
CHLORIDE: 99 mmol/L — AB (ref 101–111)
CO2: 23 mmol/L (ref 22–32)
CREATININE: 1.2 mg/dL (ref 0.61–1.24)
GFR calc non Af Amer: >60 mL/min (ref >60)
GLUCOSE: 188 mg/dL — AB (ref 65–99)
Potassium: 3.9 mmol/L (ref 3.5–5.1)
Sodium: 133 mmol/L — ABNORMAL LOW (ref 135–145)

## 2016-11-02 LAB — PROTIME-INR
INR: 1.05
PROTHROMBIN TIME: 13.7 s (ref 11.4–15.2)

## 2016-11-02 LAB — HEMOGLOBIN A1C
Hgb A1c MFr Bld: 8.4 % — ABNORMAL HIGH (ref 4.8–5.6)
Mean Plasma Glucose: 194 mg/dL

## 2016-11-02 LAB — HEPARIN LEVEL (UNFRACTIONATED)
HEPARIN UNFRACTIONATED: 0.43 [IU]/mL (ref 0.30–0.70)
Heparin Unfractionated: 0.3 IU/mL (ref 0.30–0.70)

## 2016-11-02 SURGERY — CORONARY/GRAFT ANGIOGRAPHY
Anesthesia: LOCAL

## 2016-11-02 MED ORDER — VERAPAMIL HCL 2.5 MG/ML IV SOLN
INTRAVENOUS | Status: DC | PRN
  Administered 2016-11-02: 10 mL via INTRA_ARTERIAL

## 2016-11-02 MED ORDER — HEPARIN SODIUM (PORCINE) 1000 UNIT/ML IJ SOLN
INTRAMUSCULAR | Status: AC
  Filled 2016-11-02: qty 1

## 2016-11-02 MED ORDER — SODIUM CHLORIDE 0.9% FLUSH
3.0000 mL | Freq: Two times a day (BID) | INTRAVENOUS | Status: DC
  Administered 2016-11-02 – 2016-11-04 (×3): 3 mL via INTRAVENOUS

## 2016-11-02 MED ORDER — PERFLUTREN LIPID MICROSPHERE
1.0000 mL | INTRAVENOUS | Status: AC | PRN
  Filled 2016-11-02: qty 10

## 2016-11-02 MED ORDER — LIDOCAINE HCL (PF) 1 % IJ SOLN
INTRAMUSCULAR | Status: DC | PRN
  Administered 2016-11-02: 2 mL

## 2016-11-02 MED ORDER — IOPAMIDOL (ISOVUE-370) INJECTION 76%
INTRAVENOUS | Status: AC
  Filled 2016-11-02: qty 125

## 2016-11-02 MED ORDER — SODIUM CHLORIDE 0.9% FLUSH: 3.0000 mL | INTRAVENOUS | Status: DC | PRN

## 2016-11-02 MED ORDER — RIVAROXABAN 20 MG PO TABS
20.0000 mg | ORAL_TABLET | Freq: Every day | ORAL | Status: DC
  Administered 2016-11-02 – 2016-11-03 (×2): 20 mg via ORAL
  Filled 2016-11-02 (×2): qty 1

## 2016-11-02 MED ORDER — LIDOCAINE HCL 1 % IJ SOLN
INTRAMUSCULAR | Status: AC
  Filled 2016-11-02: qty 20

## 2016-11-02 MED ORDER — HEPARIN (PORCINE) IN NACL 2-0.9 UNIT/ML-% IJ SOLN
INTRAMUSCULAR | Status: DC | PRN
  Administered 2016-11-02: 1000 mL

## 2016-11-02 MED ORDER — HEPARIN (PORCINE) IN NACL 2-0.9 UNIT/ML-% IJ SOLN
INTRAMUSCULAR | Status: AC
  Filled 2016-11-02: qty 1000

## 2016-11-02 MED ORDER — VERAPAMIL HCL 2.5 MG/ML IV SOLN
INTRAVENOUS | Status: AC
  Filled 2016-11-02: qty 2

## 2016-11-02 MED ORDER — HEPARIN SODIUM (PORCINE) 1000 UNIT/ML IJ SOLN
INTRAMUSCULAR | Status: DC | PRN
  Administered 2016-11-02: 5000 [IU] via INTRAVENOUS

## 2016-11-02 MED ORDER — FUROSEMIDE 80 MG PO TABS
80.0000 mg | ORAL_TABLET | Freq: Every day | ORAL | Status: DC
  Administered 2016-11-03 – 2016-11-04 (×2): 80 mg via ORAL
  Filled 2016-11-02 (×2): qty 1

## 2016-11-02 MED ORDER — SODIUM CHLORIDE 0.9 % IV SOLN: 250.0000 mL | INTRAVENOUS | Status: DC | PRN

## 2016-11-02 SURGICAL SUPPLY — 11 items
CATH INFINITI 5FR MULTPACK ANG (CATHETERS) ×1 IMPLANT
COVER PRB 48X5XTLSCP FOLD TPE (BAG) IMPLANT
COVER PROBE 5X48 (BAG) ×2
DEVICE RAD COMP TR BAND LRG (VASCULAR PRODUCTS) ×1 IMPLANT
GLIDESHEATH SLEND SS 6F .021 (SHEATH) ×1 IMPLANT
GUIDEWIRE INQWIRE 1.5J.035X260 (WIRE) IMPLANT
INQWIRE 1.5J .035X260CM (WIRE) ×2
KIT HEART LEFT (KITS) ×2 IMPLANT
PACK CARDIAC CATHETERIZATION (CUSTOM PROCEDURE TRAY) ×2 IMPLANT
TRANSDUCER W/STOPCOCK (MISCELLANEOUS) ×2 IMPLANT
TUBING CIL FLEX 10 FLL-RA (TUBING) ×2 IMPLANT

## 2016-11-02 NOTE — Progress Notes (Signed)
  Echocardiogram 2D Echocardiogram has been performed.  Edwin White, Edwin White 11/02/2016, 11:56 AM

## 2016-11-02 NOTE — Discharge Instructions (Signed)

## 2016-11-02 NOTE — Progress Notes (Signed)
Progress Note  Patient Name: Edwin White Date of Encounter: 11/02/2016  Primary Cardiologist: Dr. Purvis Sheffield  Subjective   Patient is feeling well; denies chest pain, SOB, and palpitations. Pt had cath today without intervention. Echo completed.  Inpatient Medications    Scheduled Meds: . aspirin EC  81 mg Oral Daily  . atorvastatin  40 mg Oral Daily  . carvedilol  12.5 mg Oral BID WC  . gabapentin  400 mg Oral BID  . insulin aspart  0-20 Units Subcutaneous TID WC  . insulin aspart  0-5 Units Subcutaneous QHS  . insulin glargine  20 Units Subcutaneous Q2200  . lisinopril  40 mg Oral Daily  . rivaroxaban  20 mg Oral Q supper  . sodium chloride flush  3 mL Intravenous Q12H  . sodium chloride flush  3 mL Intravenous Q12H   Continuous Infusions: . sodium chloride    . sodium chloride     PRN Meds: sodium chloride, sodium chloride, acetaminophen, hydrALAZINE, nitroGLYCERIN, ondansetron (ZOFRAN) IV, ondansetron **OR** [DISCONTINUED] ondansetron (ZOFRAN) IV, sodium chloride flush, sodium chloride flush   Vital Signs    Vitals:   11/02/16 1045 11/02/16 1115 11/02/16 1145 11/02/16 1333  BP: 122/86 114/74 116/80 104/69  Pulse: 88 93 90 92  Resp:      Temp:    97.9 F (36.6 C)  TempSrc:    Oral  SpO2:    97%  Weight:      Height:        Intake/Output Summary (Last 24 hours) at 11/02/16 1514 Last data filed at 11/02/16 1100  Gross per 24 hour  Intake           199.83 ml  Output              750 ml  Net          -550.17 ml   Filed Weights   11/01/16 0345 11/01/16 1200 11/02/16 0502  Weight: 227 lb 4.7 oz (103.1 kg) 223 lb 12.3 oz (101.5 kg) 225 lb 11.2 oz (102.4 kg)     Physical Exam   General: Well developed, well nourished, male appearing in no acute distress. Head: Normocephalic, atraumatic.  Neck: Supple without bruits, no JVD Lungs:  Resp regular and unlabored, CTA. Heart: RRR, S1, S2, no S3, S4, or murmur; no rub. Abdomen: Soft, non-tender,  non-distended with normoactive bowel sounds. No hepatomegaly. No rebound/guarding. No obvious abdominal masses. Extremities: No clubbing, cyanosis, no edema. Distal pedal pulses are 2+ bilaterally. Neuro: Alert and oriented X 3. Moves all extremities spontaneously. Psych: Normal affect.  Labs    Chemistry Recent Labs Lab 10/31/16 2240 10/31/16 2249 11/01/16 0406 11/02/16 0208  NA 135 135 133* 133*  K 3.8 3.9 4.0 3.9  CL 98* 100* 99* 99*  CO2 23  --  22 23  GLUCOSE 357* 362* 388* 188*  BUN 15 16 19 18   CREATININE 1.34* 1.20 1.47* 1.20  CALCIUM 9.7  --  9.2 9.2  PROT 7.5  --  5.9*  --   ALBUMIN 4.0  --  3.3*  --   AST 51*  --  58*  --   ALT 32  --  31  --   ALKPHOS 111  --  70  --   BILITOT 0.6  --  0.5  --   GFRNONAA 59*  --  53* >60  GFRAA >60  --  >60 >60  ANIONGAP 14  --  12 11  Hematology Recent Labs Lab 10/31/16 2240 10/31/16 2249 11/01/16 0406 11/02/16 0208  WBC 16.9*  --  14.6* 10.2  RBC 5.50  --  4.70 4.84  HGB 17.4* 18.4* 14.4 14.7  HCT 50.1 54.0* 42.1 43.1  MCV 91.1  --  89.6 89.0  MCH 31.6  --  30.9 30.4  MCHC 34.7  --  34.4 34.1  RDW 14.3  --  14.3 14.7  PLT 189  --  129* 137*    Cardiac Enzymes Recent Labs Lab 11/01/16 0433 11/01/16 1123  TROPONINI 3.96* 19.48*    Recent Labs Lab 10/31/16 2243  TROPIPOC 0.04     BNP Recent Labs Lab 10/31/16 2250  BNP 732.0*     DDimer No results for input(s): DDIMER in the last 168 hours.   Radiology    Dg Chest Portable 1 View  Result Date: 10/31/2016 CLINICAL DATA:  Dyspnea and wheezing tonight.  Diaphoresis. EXAM: PORTABLE CHEST 1 VIEW COMPARISON:  04/08/2015 FINDINGS: Unchanged moderate cardiomegaly. New perihilar airspace opacity, likely alveolar edema. Worsened vascular and interstitial congestive changes. No large pleural effusions. IMPRESSION: Congestive heart failure with interstitial and alveolar edema. Electronically Signed   By: Ellery Plunk M.D.   On: 10/31/2016 23:22      Telemetry    Sinus rhythm with PVCs, 7 beat run of NSVT last evening - Personally Reviewed  ECG    No new tracings - Personally Reviewed   Cardiac Studies   Echocardiogram 11/02/16: Study Conclusions - Procedure narrative: Transthoracic echocardiography. Technically   difficult study with suboptimal image definition. Intravenous   contrast (Definity) was administered. - Left ventricle: The cavity size was moderately dilated. Wall   thickness was increased in a pattern of mild LVH. Systolic   function was severely reduced. The estimated ejection fraction   was 20%. Global hypokinesis with distal anterior, apical and   inferior wall akinesis. There is a filling void in the inferior   apex with Definity contrast, suspicious for thrombus. The study   is not technically sufficient to allow evaluation of LV diastolic   function. - Mitral valve: Poorly visualized. Mildly thickened leaflets . - Left atrium: The atrium was mildly dilated. - Systemic veins: The IVC measures <2.1 cm, but does not collapse   >50%, suggesting an elevated RA pressure of 8 mmHg.  Impressions: - Compared to a prior study in 01/2016, the LVEF is <20%. There is   inferior, inferoapical and apical akinesis. There is an   inferoapical filling defect which is likely thrombus. This was   present on the prior study and may be organized.   Left Heart Catheterization 11/02/16: Conclusions: 1. Severe native CAD, stable since 2015. 2. Patent sent in LCx. 3. Patent LIMA->LAD and SVG->OM. SVG-PDA remains chronically occluded.  Recommendations: 1. Medical therapy.  Patient Profile     51 y.o. 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 He has been transferred from [facility] for NSTEMI.  Assessment & Plan    Chest pain, CAD, NSTEMI -Significant CAD history with stent 2006, CABG 2007, NSTEMI with BMS to Circ 2015 -Sudden onset chest pressure and dyspnea -In ED at [facility] pt found to be  in pulmonary edema and atrial tachycardia and hypertension. Placed on BiPap and given IV lasix, cardizem and hydralazine with improvement in his blood pressure. Breathing much better now, on room air. No current chest pain.  -Troponin rose to 19.48 -Start heparin drip. Last dose of Xarelto was Tuesday evening per chart.  Cardiac cath today with stable disease, no intervention. Echocardiogram completed with EF now 20% and possible apical thrombus (as above).  - restarted home medications: ASA, lipitor, coreg, lasix, lisinopril, and xarelto - lasix will restart tomorrow  Acute on chronic systolic CHF -ischemic cardiomyopathy EF 10-15% 2017 -repeat echo with LVEF 20% -pulmonary edema on CXR and respiratory distress on presentation. Much improved after lasix. -SCr 1.34-->1.47 --> 1.2 - home lasix to resume tomorrow  Apical thrombus  -restarted xarelto  Diabetes type 2 on insulin -Last hemoglobin A1c 8.6 on 02/06/2016 -Will check ACHS CBG's and give SSI while here  Hypertension -Continue carvedilol and lisinopril, hydralazine prn.  -Holding nifedipine. Consider increasing carvedilol after cath.   Hyperlipidemia -Last lipid panel 04/11/2016:  TC 214, LDL 129. Not to goal of <70. -Home atorvastatin 40 mg, consider increasing to high dose 80 mg for better lipid control   Signed, Marcelino Dusterngela Nicole Duke , PA-C 3:14 PM 11/02/2016 Pager: [phone]  I have seen and examined the patient along with Marcelino DusterAngela Nicole Duke , PA-C.  I have reviewed the chart, notes and new data.  I agree with PA's note.  Key new complaints: no angina or dyspnea Key examination  changes: healthy access site Key new findings / data: echo with severely depressed LVEF and apical LV thrombus  PLAN: I suspect that his NSTEMI was due to CHF decompensation and severe HTN, likely related to poor medication compliance. No new coronary stenosis was seen. Although major coronary territories are perfused, there are multiple areas with tenuous arterial supply (primarily the RCA distribution) that could suffer infarction due to increased LVEDP.  I am concerned about is social situation. He lives alone. He is unable to work. I do not believe he can care for himself. His sister is his closest relative and does not drive. Have asked SW to look into his living conditions and any options for support.  Thurmon FairMihai Bartley Vuolo, MD, [facility] Center[facility] [phone] 11/02/2016, 4:44 PM

## 2016-11-02 NOTE — Progress Notes (Signed)
ANTICOAGULATION CONSULT NOTE - Initial Consult  Pharmacy Consult for Heparin Indication: ACS / STEMI  Allergies  Allergen Reactions  . Pravastatin Other (See Comments)    PT CANNOT NOT WALK OR MOVE.    Patient Measurements: Height: 6\' 1"  (185.4 cm) Weight: 223 lb 12.3 oz (101.5 kg) IBW/kg (Calculated) : 79.9 Heparin Dosing Weight: 100 kg  Vital Signs: Temp: 98.2 F (36.8 C) (05/03 1927) Temp Source: Oral (05/03 1927) BP: 94/62 (05/03 1927) Pulse Rate: 94 (05/03 1927)  Labs:  Recent Labs  10/31/16 2240 10/31/16 2249 11/01/16 0406 11/01/16 0433 11/01/16 1123 11/01/16 1615 11/01/16 2353  HGB 17.4* 18.4* 14.4  --   --   --   --   HCT 50.1 54.0* 42.1  --   --   --   --   PLT 189  --  129*  --   --   --   --   APTT  --   --   --   --   --  32  --   HEPARINUNFRC  --   --   --   --   --  <0.10* 0.43  CREATININE 1.34* 1.20 1.47*  --   --   --   --   TROPONINI  --   --   --  3.96* 19.48*  --   --     Estimated Creatinine Clearance: 73.6 mL/min (A) (by C-G formula based on SCr of 1.47 mg/dL (H)).   Medical History: Past Medical History:  Diagnosis Date  . CHF (congestive heart failure) (HCC)    No recurrent admissions for heart failure.  . Coronary artery disease    Status post coronary bypass grafting in 2007  . Crescendo transient ischemic attacks   . Functional bowel disorder    Bloating and abdominal cramping as well as constipation requiring lubiprostone  . Gastroparesis    Gastric emptying study scheduled  . GERD (gastroesophageal reflux disease)   . Heart attack [facility](HCC) September 13, 2004   Followed by bypass surgery  . History of medication noncompliance   . Hyperlipidemia   . Hypertension    Poorly controlled  . Ischemic cardiomyopathy    Ejection fraction improved from 15%-20% to 40%-45% in 2012 by echocardiogram  . Moderate mitral regurgitation by prior echocardiogram   . NSTEMI (non-ST elevated myocardial infarction) (HCC) 12/31/2013  . Post-infarction  apical thrombus (HCC)    Anticoagulation discontinued  . Stroke [facility](HCC) 04/2012   denies residual on 12/31/2013  . Type 2 diabetes mellitus (HCC)    Medications:  PTA Medication reconcilation pending. He reports he has not taken it or any other medications in over a week.   Assessment:  51 y.o. male, with hx of apical thrombus on Xarelto [facility]. Patient reports not taken  any medications in over a week. STAT heparin level and aPTT undetectable. Drop in Pltc 189>129 and Hgb 18.4>14.4 overnight. Plan for cath tomorrow. No bleeding or infusion related issues reported.  Heparin level therapeutic: 0.43  Goal of Therapy:  Heparin level = 0.3-0.7 units/ml Monitor platelets by anticoagulation protocol: Yes   Plan:  Continue heparin gtt at 1400 units/hr Daily heparin level/CBC Monitor for s/sx of bleeding   Ruben Imony Koren Plyler, PharmD Clinical Pharmacist 11/02/2016 1:00 AM

## 2016-11-02 NOTE — Interval H&P Note (Signed)
History and Physical Interval Note:  11/02/2016 8:38 AM  Edwin White  has presented today for cardiac catheterization, with the diagnosis of NSTEMI. The various methods of treatment have been discussed with the patient and family. I was able to speak with his sister, Annamaria BootsOpal, as well, who is in agreement with catheterization After consideration of risks, benefits and other options for treatment, the patient has consented to  Procedure(s): Left Heart Cath and Cors/Grafts Angiography (N/A) as a surgical intervention .  The patient's history has been reviewed, patient examined, no change in status, stable for surgery.  I have reviewed the patient's chart and labs.  Questions were answered to the patient's satisfaction.    Cath Lab Visit (complete for each Cath Lab visit)  Clinical Evaluation Leading to the Procedure:   ACS: Yes.   (NSTEMI)  Non-ACS:  N/A  Azhane Eckart

## 2016-11-02 NOTE — Progress Notes (Signed)
ANTICOAGULATION CONSULT NOTE - Follow-up Consult  Pharmacy Consult for Heparin Indication: ACS / STEMI  Allergies  Allergen Reactions  . Pravastatin Other (See Comments)    PT CANNOT NOT WALK OR MOVE.    Patient Measurements: Height: 6\' 1"  (185.4 cm) Weight: 223 lb 12.3 oz (101.5 kg) IBW/kg (Calculated) : 79.9 Heparin Dosing Weight: 100 kg  Vital Signs: Temp: 98.2 F (36.8 C) (05/03 1927) Temp Source: Oral (05/03 1927) BP: 94/62 (05/03 1927) Pulse Rate: 94 (05/03 1927)  Labs:  Recent Labs  10/31/16 2240 10/31/16 2249 11/01/16 0406 11/01/16 0433 11/01/16 1123 11/01/16 1615 11/01/16 2353 11/02/16 0208  HGB 17.4* 18.4* 14.4  --   --   --   --  14.7  HCT 50.1 54.0* 42.1  --   --   --   --  43.1  PLT 189  --  129*  --   --   --   --  PENDING  APTT  --   --   --   --   --  32  --   --   LABPROT  --   --   --   --   --   --   --  13.7  INR  --   --   --   --   --   --   --  1.05  HEPARINUNFRC  --   --   --   --   --  <0.10* 0.43 0.30  CREATININE 1.34* 1.20 1.47*  --   --   --   --   --   TROPONINI  --   --   --  3.96* 19.48*  --   --   --     Estimated Creatinine Clearance: 73.6 mL/min (A) (by C-G formula based on SCr of 1.47 mg/dL (H)).   Medical History: Past Medical History:  Diagnosis Date  . CHF (congestive heart failure) (HCC)    No recurrent admissions for heart failure.  . Coronary artery disease    Status post coronary bypass grafting in 2007  . Crescendo transient ischemic attacks   . Functional bowel disorder    Bloating and abdominal cramping as well as constipation requiring lubiprostone  . Gastroparesis    Gastric emptying study scheduled  . GERD (gastroesophageal reflux disease)   . Heart attack [facility](HCC) September 13, 2004   Followed by bypass surgery  . History of medication noncompliance   . Hyperlipidemia   . Hypertension    Poorly controlled  . Ischemic cardiomyopathy    Ejection fraction improved from 15%-20% to 40%-45% in 2012 by  echocardiogram  . Moderate mitral regurgitation by prior echocardiogram   . NSTEMI (non-ST elevated myocardial infarction) (HCC) 12/31/2013  . Post-infarction apical thrombus (HCC)    Anticoagulation discontinued  . Stroke [facility](HCC) 04/2012   denies residual on 12/31/2013  . Type 2 diabetes mellitus (HCC)    Medications:  PTA Medication reconcilation pending. He reports he has not taken it or any other medications in over a week.   Assessment:  51 y.o. male, with hx of apical thrombus on Xarelto [facility] Patient reports not taken  any medications in over a week. STAT heparin level and aPTT undetectable. Drop in Pltc 189>129 and Hgb 18.4>14.4 overnight. Plan for cath tomorrow. No bleeding or infusion related issues reported. Will increase rate to keep heparin level therapeutic.  Heparin level therapeutic: 0.30  Goal of Therapy:  Heparin level =  0.3-0.7 units/ml Monitor platelets by anticoagulation protocol: Yes   Plan:  Increase heparin gtt to 1500 units/hr  Daily heparin level/CBC Monitor for s/sx of bleeding   Ruben Im, PharmD Clinical Pharmacist 11/02/2016 3:30 AM

## 2016-11-03 DIAGNOSIS — I6330 Cerebral infarction due to thrombosis of unspecified cerebral artery: Secondary | ICD-10-CM

## 2016-11-03 LAB — GLUCOSE, CAPILLARY
GLUCOSE-CAPILLARY: 184 mg/dL — AB (ref 65–99)
GLUCOSE-CAPILLARY: 142 mg/dL — AB (ref 65–99)
Glucose-Capillary: 153 mg/dL — ABNORMAL HIGH (ref 65–99)
Glucose-Capillary: 161 mg/dL — ABNORMAL HIGH (ref 65–99)
Glucose-Capillary: 147 mg/dL — ABNORMAL HIGH (ref 65–99)

## 2016-11-03 MED ORDER — ATORVASTATIN CALCIUM 80 MG PO TABS: 80.0000 mg | ORAL_TABLET | Freq: Every day | ORAL | Status: DC

## 2016-11-03 MED ORDER — MAGNESIUM HYDROXIDE 400 MG/5ML PO SUSP: 30.0000 mL | Freq: Once | ORAL | Status: DC

## 2016-11-03 NOTE — Progress Notes (Signed)
Progress Note  Patient Name: Edwin White Date of Encounter: 11/03/2016  Primary Cardiologist: Dr. Purvis Sheffield  Subjective   No events overnight. Cath did not show new disease amenable to PCI. Echo shows persistently low EF around 15% with old apical thrombus. Xarelto has been restarted (although not FDA approved for this indication). Plan to start po lasix today. Social work consulted - appreciate their assistance regarding unsafe living situation. No labs today.  Inpatient Medications    Scheduled Meds: . aspirin EC  81 mg Oral Daily  . atorvastatin  40 mg Oral Daily  . carvedilol  12.5 mg Oral BID WC  . furosemide  80 mg Oral Daily  . gabapentin  400 mg Oral BID  . insulin aspart  0-20 Units Subcutaneous TID WC  . insulin aspart  0-5 Units Subcutaneous QHS  . insulin glargine  20 Units Subcutaneous Q2200  . lisinopril  40 mg Oral Daily  . rivaroxaban  20 mg Oral Q supper  . sodium chloride flush  3 mL Intravenous Q12H  . sodium chloride flush  3 mL Intravenous Q12H   Continuous Infusions: . sodium chloride    . sodium chloride     PRN Meds: sodium chloride, sodium chloride, acetaminophen, hydrALAZINE, nitroGLYCERIN, ondansetron (ZOFRAN) IV, ondansetron **OR** [DISCONTINUED] ondansetron (ZOFRAN) IV, sodium chloride flush, sodium chloride flush   Vital Signs    Vitals:   11/02/16 1742 11/02/16 2001 11/03/16 0502 11/03/16 0557  BP: (!) 144/80 116/77 111/82   Pulse: (!) 101 90 86   Resp:  (!) 21 18   Temp:  98.4 F (36.9 C) 98.5 F (36.9 C)   TempSrc:  Oral Oral   SpO2:  91% 96%   Weight:    225 lb 9.6 oz (102.3 kg)  Height:        Intake/Output Summary (Last 24 hours) at 11/03/16 0909 Last data filed at 11/02/16 2130  Gross per 24 hour  Intake              468 ml  Output                0 ml  Net              468 ml   Filed Weights   11/01/16 1200 11/02/16 0502 11/03/16 0557  Weight: 223 lb 12.3 oz (101.5 kg) 225 lb 11.2 oz (102.4 kg) 225 lb 9.6 oz (102.3  kg)     Physical Exam   General appearance: alert and no distress Neck: no carotid bruit and no JVD Lungs: clear to auscultation bilaterally Heart: regular rate and rhythm Abdomen: soft, non-tender; bowel sounds normal; no masses,  no organomegaly Extremities: extremities normal, atraumatic, no cyanosis or edema Pulses: 2+ and symmetric Skin: Skin color, texture, turgor normal. No rashes or lesions Neurologic: Grossly normal Psych: Pleasant mood   Labs    Chemistry  Recent Labs Lab 10/31/16 2240 10/31/16 2249 11/01/16 0406 11/02/16 0208  NA 135 135 133* 133*  K 3.8 3.9 4.0 3.9  CL 98* 100* 99* 99*  CO2 23  --  22 23  GLUCOSE 357* 362* 388* 188*  BUN 15 16 19 18   CREATININE 1.34* 1.20 1.47* 1.20  CALCIUM 9.7  --  9.2 9.2  PROT 7.5  --  5.9*  --   ALBUMIN 4.0  --  3.3*  --   AST 51*  --  58*  --   ALT 32  --  31  --  ALKPHOS 111  --  70  --   BILITOT 0.6  --  0.5  --   GFRNONAA 59*  --  53* >60  GFRAA >60  --  >60 >60  ANIONGAP 14  --  12 11     Hematology  Recent Labs Lab 10/31/16 2240 10/31/16 2249 11/01/16 0406 11/02/16 0208  WBC 16.9*  --  14.6* 10.2  RBC 5.50  --  4.70 4.84  HGB 17.4* 18.4* 14.4 14.7  HCT 50.1 54.0* 42.1 43.1  MCV 91.1  --  89.6 89.0  MCH 31.6  --  30.9 30.4  MCHC 34.7  --  34.4 34.1  RDW 14.3  --  14.3 14.7  PLT 189  --  129* 137*    Cardiac Enzymes  Recent Labs Lab 11/01/16 0433 11/01/16 1123  TROPONINI 3.96* 19.48*     Recent Labs Lab 10/31/16 2243  TROPIPOC 0.04     BNP  Recent Labs Lab 10/31/16 2250  BNP 732.0*     DDimer No results for input(s): DDIMER in the last 168 hours.   Radiology    No results found.   Telemetry   Sinus rhythm - Personally Reviewed  ECG    NSR at 83, non-specific T wave changes, mildly prolonged QTc - Personally Reviewed   Cardiac Studies   Echocardiogram 11/02/16: Study Conclusions - Procedure narrative: Transthoracic echocardiography. Technically   difficult  study with suboptimal image definition. Intravenous   contrast (Definity) was administered. - Left ventricle: The cavity size was moderately dilated. Wall   thickness was increased in a pattern of mild LVH. Systolic   function was severely reduced. The estimated ejection fraction   was 20%. Global hypokinesis with distal anterior, apical and   inferior wall akinesis. There is a filling void in the inferior   apex with Definity contrast, suspicious for thrombus. The study   is not technically sufficient to allow evaluation of LV diastolic   function. - Mitral valve: Poorly visualized. Mildly thickened leaflets . - Left atrium: The atrium was mildly dilated. - Systemic veins: The IVC measures <2.1 cm, but does not collapse   >50%, suggesting an elevated RA pressure of 8 mmHg.  Impressions: - Compared to a prior study in 01/2016, the LVEF is <20%. There is   inferior, inferoapical and apical akinesis. There is an   inferoapical filling defect which is likely thrombus. This was   present on the prior study and may be organized.   Left Heart Catheterization 11/02/16: Conclusions: 1. Severe native CAD, stable since 2015. 2. Patent sent in LCx. 3. Patent LIMA->LAD and SVG->OM. SVG-PDA remains chronically occluded.  Recommendations: 1. Medical therapy.  Patient Profile     51 y.o. 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 [facility]  Assessment & Plan    Chest pain, CAD, NSTEMI -Significant CAD history with stent 2006, CABG 2007, NSTEMI with BMS to Circ 2015 -Sudden onset chest pressure and  dyspnea -[facility] found to be in pulmonary edema and atrial tachycardia and hypertension. Placed on BiPap and given IV lasix, cardizem and hydralazine with improvement in  his blood pressure. Breathing much better now, on room air. No current chest pain.  -Troponin rose to 19.48 -Start heparin drip. Last dose of Xarelto was Tuesday evening per chart.  Cardiac cath today with stable disease, no intervention. Echocardiogram completed with EF now 20% and possible apical thrombus (as above).  - restarted home medications: ASA, lipitor, coreg, lasix, lisinopril, and xarelto  Plan to restart home lasix today - recheck BMET tomorrow am  Acute on chronic systolic CHF -ischemic cardiomyopathy EF 10-15% 2017 -repeat echo with LVEF 20% -pulmonary edema on CXR and respiratory distress on presentation. Much improved after lasix. -SCr 1.34-->1.47 --> 1.2 Resume home lasix today.  Apical thrombus  -restarted xarelto yesterday  Diabetes type 2 on insulin -Last hemoglobin A1c 8.6 on 02/06/2016 -Will check ACHS CBG's and give SSI while here  Hypertension -Continue carvedilol and lisinopril, hydralazine prn.  -Holding nifedipine.  BP well controlled- will not support increase in medication at this time.  Hyperlipidemia -Last lipid panel 04/11/2016:  TC 214, LDL 129. Not to goal of <70. -Agree with increasing atorvastatin to 80 mg daily given ongoing ischemia.  Dispo Apparently lives alone, aphasic- medication non-compliant. He wants to go home, although Dr. Salena Saner wanted him to have a SW evaluation. I will contact them today.  Chrystie Nose, MD, [facility]  [place]  [facility]  Attending Cardiologist  Direct Dial: [phone]  Fax: [phone]  Website:  [email or web address] Nicely Paiten Boies 9:09 AM 11/03/2016

## 2016-11-03 NOTE — Progress Notes (Signed)
Patient off monitor went into room and patient was in shower with walker and tele. Box half on and half off, I.V. Not wrapped. Assist back to bed dried patient and put new gown and footies on , Instr.. patient he needed an order to get shower and ask him not to get out of bed alone. He says he understands. But within 1-2 minutes was up in bathroom alone again. Assist back to bed.

## 2016-11-03 NOTE — Progress Notes (Signed)
CARDIAC REHAB PHASE I   PRE:  Rate/Rhythm: 89  BP:  Sitting: 137/78     SaO2: 92ra  MODE:  Ambulation: 350 ft   POST:  Rate/Rhythm: 93  BP:  Sitting: 128/80     SaO2: 90ra  9:50am-10:15am Patient ambulated with x1 assist and rolling walker. Walked slow and slightly staggered. Stated he felt fine and steady. No complaints. Constant pulse ox monitoring-- Never went below 90% on ra. Patient wanted to sit up on edge of bed and watch TV. Call bell in reach.  Barnabas ListerMolly M Urania Pearlman, MS 11/03/2016 10:11 AM

## 2016-11-03 NOTE — Progress Notes (Signed)
CBG 108. 

## 2016-11-03 NOTE — Progress Notes (Signed)
CSW spoke with patient's sister regarding plans for discharge. Per Opal, patient will be returning to his own home, however they are currently waiting on transportation for the patient. CSW explored if family would be able to monitor the patient's medication, however sister informed CSW that she does not drive and could possibly go to his home once or twice a week. Sister reports patient is non compliant with meds and does not take advise from the family, however they will try to encourage the patient to take his meds. CSW will provide update to MD. No other concerns reported at this time.   Fernande BoydenJoyce Elif Yonts, LCSWA Clinical Social Worker [facility]

## 2016-11-04 DIAGNOSIS — I160 Hypertensive urgency: Secondary | ICD-10-CM

## 2016-11-04 LAB — GLUCOSE, CAPILLARY: Glucose-Capillary: 164 mg/dL — ABNORMAL HIGH (ref 65–99)

## 2016-11-04 MED ORDER — RIVAROXABAN 20 MG PO TABS: 20.0000 mg | ORAL_TABLET | Freq: Every day | ORAL | 11 refills | Status: DC

## 2016-11-04 MED ORDER — HYDRALAZINE HCL 25 MG PO TABS: 25.0000 mg | ORAL_TABLET | Freq: Four times a day (QID) | ORAL | 3 refills | Status: DC | PRN

## 2016-11-04 MED ORDER — NITROGLYCERIN 0.4 MG SL SUBL: 0.4000 mg | SUBLINGUAL_TABLET | SUBLINGUAL | 2 refills | Status: DC | PRN

## 2016-11-04 MED ORDER — CARVEDILOL 12.5 MG PO TABS: 12.5000 mg | ORAL_TABLET | Freq: Two times a day (BID) | ORAL | 5 refills | Status: AC

## 2016-11-04 MED ORDER — POTASSIUM CHLORIDE CRYS ER 20 MEQ PO TBCR: 20.0000 meq | EXTENDED_RELEASE_TABLET | Freq: Every day | ORAL | 3 refills | Status: DC

## 2016-11-04 MED ORDER — LISINOPRIL 40 MG PO TABS: 40.0000 mg | ORAL_TABLET | Freq: Every day | ORAL | 5 refills | Status: DC

## 2016-11-04 MED ORDER — ATORVASTATIN CALCIUM 80 MG PO TABS: 80.0000 mg | ORAL_TABLET | Freq: Every day | ORAL | 5 refills | Status: AC

## 2016-11-04 MED ORDER — FUROSEMIDE 80 MG PO TABS: 80.0000 mg | ORAL_TABLET | Freq: Every day | ORAL | 3 refills | Status: DC

## 2016-11-04 NOTE — Discharge Summary (Signed)
Discharge Summary    Patient ID: Edwin White,  MRN: [id number], DOB/AGE: 53-Sep-1967 53 y.o.  Admit date: 10/31/2016 Discharge date: 11/04/2016  Primary Care Provider: Dettinger, Ivin Booty A Primary Cardiologist: Dr. Purvis Sheffield   Discharge Diagnoses    Active Problems:   COPD (chronic obstructive pulmonary disease) (HCC)   Hypertension   NSTEMI (non-ST elevated myocardial infarction) [facility])   Cerebrovascular accident (CVA) due to thrombosis of cerebral artery (HCC)   Apical mural thrombus   Pulmonary edema   Acute on chronic combined systolic and diastolic heart failure (HCC)   Hypertensive urgency   Allergies Allergies  Allergen Reactions  . Pravastatin Other (See Comments)    PT CANNOT NOT WALK OR MOVE.    Diagnostic Studies/Procedures    Procedures   Coronary/Graft Angiography  Conclusion   Conclusions: 1. Severe native CAD, stable since 2015. 2. Patent sent in LCx. 3. Patent LIMA->LAD and SVG->OM. SVG-PDA remains chronically occluded.  Recommendations: 1. Medical therapy.    2D Echo 11/02/16 Study Conclusions  - Procedure narrative: Transthoracic echocardiography. Technically   difficult study with suboptimal image definition. Intravenous   contrast (Definity) was administered. - Left ventricle: The cavity size was moderately dilated. Wall   thickness was increased in a pattern of mild LVH. Systolic   function was severely reduced. The estimated ejection fraction   was 20%. Global hypokinesis with distal anterior, apical and   inferior wall akinesis. There is a filling void in the inferior   apex with Definity contrast, suspicious for thrombus. The study   is not technically sufficient to allow evaluation of LV diastolic   function. - Mitral valve: Poorly visualized. Mildly thickened leaflets . - Left atrium: The atrium was mildly dilated. - Systemic veins: The IVC measures <2.1 cm, but does not collapse   >50%, suggesting an elevated RA pressure of  8 mmHg.  Impressions:  - Compared to a prior study in 01/2016, the LVEF is <20%. There is   inferior, inferoapical and apical akinesis. There is an   inferoapical filling defect which is likely thrombus. This was   present on the prior study and may be organized.  History of Present Illness     51 y.o. male with past medical history of chronic systolic CHF with ischemic cardiomyopathy EF 10-15% 2017, coronary artery disease stent 2006,status post coronary artery bypass grafting in 2007, NSTEMI with BMS to Cx 2015, Apical thrombus on Xarelto 2015, history of medical noncompliance, moderate mitral regurgitation by prior echo, with other history to include CVA, type 2 diabetes, and GERD. He was transferred from [facility] for NSTEMI and acute pulmonary edema in the setting of severe HTN on 10/31/16.   Hospital Course   At [facility], Pt was found to be in pulmonary edema and atrial tachycardia and hypertension. Placed on BiPap and given IV lasix, cardizem and hydralazine with improvement in his blood pressure. Troponin peaked at 19.49. Transferred to [facility] for LHC. This was performed 11/02/16 by Dr. Okey Dupre. He was found to have stable CAD (see cath report above). No intervention was performed. Continue medical therapy elected. Echocardiogram showed EF at 20% with possible apical thrombus. He was treated with Xarelto and continued on ASA, Lipitor, Coreg, Lisinopril and Lasix. Lipitor was increased to 80 mg (LDL 129 mg/dL). On 11/04/16, he was seen and examined by Dr. Mayford Knife. He was w/o CP. He was euvolemic w/o dyspnea and BP was controlled. No post cath complications. Dr. Mayford Knife felt that he was  stable for discharge home. He will f/u with Dr. Delorse Lek in [place].   Consultants: none    Discharge Vitals Blood pressure 122/74, pulse 79, temperature 98.2 F (36.8 C), temperature source Oral, resp. rate 18, height 6\' 1"  (1.854 m), weight 224 lb 6.9 oz (101.8 kg), SpO2 96 %.  Filed Weights    11/02/16 0502 11/03/16 0557 11/04/16 0607  Weight: 225 lb 11.2 oz (102.4 kg) 225 lb 9.6 oz (102.3 kg) 224 lb 6.9 oz (101.8 kg)    Labs & Radiologic Studies    CBC  Recent Labs  11/02/16 0208  WBC 10.2  HGB 14.7  HCT 43.1  MCV 89.0  PLT 137*   Basic Metabolic Panel  Recent Labs  11/02/16 0208  NA 133*  K 3.9  CL 99*  CO2 23  GLUCOSE 188*  BUN 18  CREATININE 1.20  CALCIUM 9.2   Liver Function Tests No results for input(s): AST, ALT, ALKPHOS, BILITOT, PROT, ALBUMIN in the last 72 hours. No results for input(s): LIPASE, AMYLASE in the last 72 hours. Cardiac Enzymes No results for input(s): CKTOTAL, CKMB, CKMBINDEX, TROPONINI in the last 72 hours. BNP Invalid input(s): POCBNP D-Dimer No results for input(s): DDIMER in the last 72 hours. Hemoglobin A1C No results for input(s): HGBA1C in the last 72 hours. Fasting Lipid Panel No results for input(s): CHOL, HDL, LDLCALC, TRIG, CHOLHDL, LDLDIRECT in the last 72 hours. Thyroid Function Tests No results for input(s): TSH, T4TOTAL, T3FREE, THYROIDAB in the last 72 hours.  Invalid input(s): FREET3 _____________  Dg Chest Portable 1 View  Result Date: 10/31/2016 CLINICAL DATA:  Dyspnea and wheezing tonight.  Diaphoresis. EXAM: PORTABLE CHEST 1 VIEW COMPARISON:  04/08/2015 FINDINGS: Unchanged moderate cardiomegaly. New perihilar airspace opacity, likely alveolar edema. Worsened vascular and interstitial congestive changes. No large pleural effusions. IMPRESSION: Congestive heart failure with interstitial and alveolar edema. Electronically Signed   By: Ellery Plunk M.D.   On: 10/31/2016 23:22   Disposition   Pt is being discharged home today in good condition.  Follow-up Plans & Appointments    Follow-up Information    Laqueta Linden, MD Follow up.   Specialty:  Cardiology Why:  our office will call you with a follow-up appointment  Contact information: [address] [place]  [address] [phone]          Discharge Instructions    Diet - low sodium heart healthy    Complete by:  As directed    Increase activity slowly    Complete by:  As directed       Discharge Medications   Discharge Medication List as of 11/04/2016 12:31 PM    START taking these medications   Details  hydrALAZINE (APRESOLINE) 25 MG tablet Take 1 tablet (25 mg total) by mouth every 6 (six) hours as needed (BP > 160/100)., Starting Sun 11/04/2016, Normal      CONTINUE these medications which have CHANGED   Details  atorvastatin (LIPITOR) 80 MG tablet Take 1 tablet (80 mg total) by mouth daily., Starting Sun 11/04/2016, Normal    carvedilol (COREG) 12.5 MG tablet Take 1 tablet (12.5 mg total) by mouth 2 (two) times daily., Starting Sun 11/04/2016, Normal    furosemide (LASIX) 80 MG tablet Take 1 tablet (80 mg total) by mouth daily., Starting Sun 11/04/2016, Normal    lisinopril (PRINIVIL,ZESTRIL) 40 MG tablet Take 1 tablet (40 mg total) by mouth daily., Starting Sun 11/04/2016, Normal    nitroGLYCERIN (NITROSTAT) 0.4 MG  SL tablet Place 1 tablet (0.4 mg total) under the tongue every 5 (five) minutes x 3 doses as needed for chest pain., Starting Sun 11/04/2016, Normal    potassium chloride SA (K-DUR,KLOR-CON) 20 MEQ tablet Take 1 tablet (20 mEq total) by mouth daily., Starting Sun 11/04/2016, Normal    rivaroxaban (XARELTO) 20 MG TABS tablet Take 1 tablet (20 mg total) by mouth daily with supper., Starting Sun 11/04/2016, Normal      CONTINUE these medications which have NOT CHANGED   Details  aspirin 81 MG EC tablet TAKE 1 TABLET BY MOUTH EVERY DAY, Normal    canagliflozin (INVOKANA) 100 MG TABS tablet Take 1 tablet (100 mg total) by mouth daily before breakfast., Starting Wed 04/11/2016, Normal    gabapentin (NEURONTIN) 400 MG capsule TAKE 1 CAPSULE BY MOUTH TWICE DAILY, Normal    Insulin Glargine (LANTUS SOLOSTAR) 100 UNIT/ML Solostar Pen Inject 20 Units into the skin daily at 10 pm.,  Starting Fri 08/24/2016, Normal    sitaGLIPtin-metformin (JANUMET) 50-1000 MG tablet Take 1 tablet by mouth 2 (two) times daily with a meal., Historical Med      STOP taking these medications     NIFEdipine (PROCARDIA-XL/ADALAT CC) 60 MG 24 hr tablet          Aspirin prescribed at discharge?  Yes High Intensity Statin Prescribed? (Lipitor 40-80mg  or Crestor 20-40mg ): Yes Beta Blocker Prescribed? Yes For EF <40%, was ACEI/ARB Prescribed? Yes ADP Receptor Inhibitor Prescribed? (i.e. Plavix etc.-Includes Medically Managed Patients): No:  For EF <40%, Aldosterone Inhibitor Prescribed? No: on Xarelto + ASA Was EF assessed during THIS hospitalization? Yes Was Cardiac Rehab II ordered? (Included Medically managed Patients): Yes   Outstanding Labs/Studies     Duration of Discharge Encounter   Greater than 30 minutes including physician time.  Signed, Robbie LisBrittainy Lukas Pelcher PA-C 11/04/2016, 2:16 PM

## 2016-11-04 NOTE — Progress Notes (Signed)
Edwin White to be D/C'd Home per MD order. Discussed with the patient and all questions fully answered.    VVS, Skin clean, dry and intact without evidence of skin break down, no evidence of skin tears noted.  IV catheter discontinued intact. Site without signs and symptoms of complications.  An After Visit Summary was printed and given to the patient.  Edwin White, [name]  11/04/2016 6:46 PM

## 2016-11-04 NOTE — Progress Notes (Addendum)
Progress Note  Patient Name: Edwin White Date of Encounter: 11/04/2016  Primary Cardiologist: Dr. Purvis Sheffield  Subjective   No chest pain or SOB overnight. Cath did not show new disease amenable to PCI. Echo shows persistently low EF around 15% with old apical thrombus. Xarelto has been restarted (although not FDA approved for this indication). Social work consulted - appreciate their assistance regarding unsafe living situation. No labs today.  Inpatient Medications    Scheduled Meds: . aspirin EC  81 mg Oral Daily  . atorvastatin  80 mg Oral q1800  . carvedilol  12.5 mg Oral BID WC  . furosemide  80 mg Oral Daily  . gabapentin  400 mg Oral BID  . insulin aspart  0-20 Units Subcutaneous TID WC  . insulin aspart  0-5 Units Subcutaneous QHS  . insulin glargine  20 Units Subcutaneous Q2200  . lisinopril  40 mg Oral Daily  . rivaroxaban  20 mg Oral Q supper  . sodium chloride flush  3 mL Intravenous Q12H  . sodium chloride flush  3 mL Intravenous Q12H   Continuous Infusions: . sodium chloride    . sodium chloride     PRN Meds: sodium chloride, sodium chloride, acetaminophen, hydrALAZINE, nitroGLYCERIN, ondansetron (ZOFRAN) IV, ondansetron **OR** [DISCONTINUED] ondansetron (ZOFRAN) IV, sodium chloride flush, sodium chloride flush   Vital Signs    Vitals:   11/03/16 0557 11/03/16 1309 11/03/16 2042 11/04/16 0607  BP:  109/74 117/86 122/74  Pulse:  79 84 79  Resp:  20 18 18   Temp:  97.8 F (36.6 C) 98.4 F (36.9 C) 98.2 F (36.8 C)  TempSrc:  Oral Oral Oral  SpO2:  91% 94% 96%  Weight: 225 lb 9.6 oz (102.3 kg)   224 lb 6.9 oz (101.8 kg)  Height:        Intake/Output Summary (Last 24 hours) at 11/04/16 0934 Last data filed at 11/04/16 0300  Gross per 24 hour  Intake                0 ml  Output                0 ml  Net                0 ml   Filed Weights   11/02/16 0502 11/03/16 0557 11/04/16 0607  Weight: 225 lb 11.2 oz (102.4 kg) 225 lb 9.6 oz (102.3 kg) 224  lb 6.9 oz (101.8 kg)     Physical Exam   General appearance:WD, WN in NAD Neck: no bruit or JVD Lungs: CTA bilaterally Heart:RRR no M/R/G Abdomen: soft, NT, ND with active BS Extremities: no edema Pulses: 2+ and symmetric Skin: no rashes Neurologic: A&O x 3 Psych:normal affect   Labs    Chemistry  Recent Labs Lab 10/31/16 2240 10/31/16 2249 11/01/16 0406 11/02/16 0208  NA 135 135 133* 133*  K 3.8 3.9 4.0 3.9  CL 98* 100* 99* 99*  CO2 23  --  22 23  GLUCOSE 357* 362* 388* 188*  BUN 15 16 19 18   CREATININE 1.34* 1.20 1.47* 1.20  CALCIUM 9.7  --  9.2 9.2  PROT 7.5  --  5.9*  --   ALBUMIN 4.0  --  3.3*  --   AST 51*  --  58*  --   ALT 32  --  31  --   ALKPHOS 111  --  70  --   BILITOT 0.6  --  0.5  --  GFRNONAA 59*  --  53* >60  GFRAA >60  --  >60 >60  ANIONGAP 14  --  12 11     Hematology  Recent Labs Lab 10/31/16 2240 10/31/16 2249 11/01/16 0406 11/02/16 0208  WBC 16.9*  --  14.6* 10.2  RBC 5.50  --  4.70 4.84  HGB 17.4* 18.4* 14.4 14.7  HCT 50.1 54.0* 42.1 43.1  MCV 91.1  --  89.6 89.0  MCH 31.6  --  30.9 30.4  MCHC 34.7  --  34.4 34.1  RDW 14.3  --  14.3 14.7  PLT 189  --  129* 137*    Cardiac Enzymes  Recent Labs Lab 11/01/16 0433 11/01/16 1123  TROPONINI 3.96* 19.48*     Recent Labs Lab 10/31/16 2243  TROPIPOC 0.04     BNP  Recent Labs Lab 10/31/16 2250  BNP 732.0*     DDimer No results for input(s): DDIMER in the last 168 hours.   Radiology    No results found.   Telemetry  NSR - Personally Reviewed  ECG    No new EKG for review- Personally Reviewed   Cardiac Studies   Echocardiogram 11/02/16: Study Conclusions - Procedure narrative: Transthoracic echocardiography. Technically   difficult study with suboptimal image definition. Intravenous   contrast (Definity) was administered. - Left ventricle: The cavity size was moderately dilated. Wall   thickness was increased in a pattern of mild LVH. Systolic    function was severely reduced. The estimated ejection fraction   was 20%. Global hypokinesis with distal anterior, apical and   inferior wall akinesis. There is a filling void in the inferior   apex with Definity contrast, suspicious for thrombus. The study   is not technically sufficient to allow evaluation of LV diastolic   function. - Mitral valve: Poorly visualized. Mildly thickened leaflets . - Left atrium: The atrium was mildly dilated. - Systemic veins: The IVC measures <2.1 cm, but does not collapse   >50%, suggesting an elevated RA pressure of 8 mmHg.  Impressions: - Compared to a prior study in 01/2016, the LVEF is <20%. There is   inferior, inferoapical and apical akinesis. There is an   inferoapical filling defect which is likely thrombus. This was   present on the prior study and may be organized.   Left Heart Catheterization 11/02/16: Conclusions: 1. Severe native CAD, stable since 2015. 2. Patent sent in LCx. 3. Patent LIMA->LAD and SVG->OM. SVG-PDA remains chronically occluded.  Recommendations: 1. Medical therapy.  Patient Profile     51 y.o. 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 He has been transferred from [facility] for NSTEMI.  Assessment & Plan    Chest pain, CAD, NSTEMI -Significant CAD history with stent 2006, CABG 2007, NSTEMI with BMS to Circ 2015 -Sudden onset chest pressure and dyspnea -In ED at [facility] pt found to be in pulmonary edema and atrial tachycardia and hypertension. Placed on BiPap and given IV lasix, cardizem and hydralazine with improvement in his blood pressure.  -Troponin rose to  19.48 - Cardiac cath with stable disease, no intervention, medical therapy.  - Echocardiogram completed with EF now 20% and possible  apical thrombus (as above).  - restarted home medications: ASA, lipitor, coreg, lasix, lisinopril, and xarelto   Acute on chronic systolic CHF -ischemic cardiomyopathy EF 10-15% 2017 -repeat echo with LVEF 20% -pulmonary edema on CXR and respiratory distress on presentation. Much improved after lasix. -SCr 1.34-->1.47 --> 1.2 -I&Os incomplete -continue Lasix -repeat BMET today  Apical thrombus  -restarted xarelto   Diabetes type 2 on insulin -Last hemoglobin A1c 8.6 on 02/06/2016 -Will check ACHS CBG's and give SSI while here  Hypertension -Continue carvedilol and lisinopril, hydralazine prn.  -Holding nifedipine.  - BP well controlled at 122/7074mmHg today   Hyperlipidemia -Last lipid panel 04/11/2016:  TC 214, LDL 129. Not to goal of <70. 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 He wants to go home. Appreciate SW input.  Patient's family unable to provide much support.  Sister thinks she coule help with meds 1-2 times per week but she does not drive.  Will discuss whether there are any other options with SW.  If no other options will discharge patient home as he is very anxious to get home.  Armanda Magicraci Alashia Brownfield MD [facility] 9:34 AM 11/04/2016

## 2016-11-05 ENCOUNTER — Telehealth (HOSPITAL_COMMUNITY): Payer: Self-pay | Admitting: Nurse Practitioner

## 2016-11-05 LAB — GLUCOSE, CAPILLARY
Glucose-Capillary: 108 mg/dL — ABNORMAL HIGH (ref 65–99)
Glucose-Capillary: 150 mg/dL — ABNORMAL HIGH (ref 65–99)

## 2016-11-14 ENCOUNTER — Ambulatory Visit: Payer: Medicaid Other | Admitting: Family Medicine

## 2016-11-19 NOTE — ED Notes (Signed)
Security asking about pt's wallet.  Family called looking for it.  Read through notes to see if a wallet was mentioned.

## 2017-03-08 ENCOUNTER — Emergency Department (HOSPITAL_COMMUNITY): Payer: Medicaid Other

## 2017-03-08 ENCOUNTER — Encounter (HOSPITAL_COMMUNITY): Payer: Self-pay | Admitting: Emergency Medicine

## 2017-03-08 ENCOUNTER — Emergency Department (HOSPITAL_COMMUNITY)
Admission: EM | Admit: 2017-03-08 | Discharge: 2017-03-08 | Disposition: A | Discharge status: Home / Self Care | Payer: Medicaid Other | Attending: Emergency Medicine | Admitting: Emergency Medicine

## 2017-03-08 DIAGNOSIS — I252 Old myocardial infarction: Secondary | ICD-10-CM | POA: Insufficient documentation

## 2017-03-08 DIAGNOSIS — F17210 Nicotine dependence, cigarettes, uncomplicated: Secondary | ICD-10-CM | POA: Insufficient documentation

## 2017-03-08 DIAGNOSIS — I5042 Chronic combined systolic (congestive) and diastolic (congestive) heart failure: Secondary | ICD-10-CM | POA: Diagnosis not present

## 2017-03-08 DIAGNOSIS — E1165 Type 2 diabetes mellitus with hyperglycemia: Secondary | ICD-10-CM | POA: Insufficient documentation

## 2017-03-08 DIAGNOSIS — Z794 Long term (current) use of insulin: Secondary | ICD-10-CM | POA: Diagnosis not present

## 2017-03-08 DIAGNOSIS — I2510 Atherosclerotic heart disease of native coronary artery without angina pectoris: Secondary | ICD-10-CM | POA: Diagnosis not present

## 2017-03-08 DIAGNOSIS — R112 Nausea with vomiting, unspecified: Secondary | ICD-10-CM | POA: Diagnosis present

## 2017-03-08 DIAGNOSIS — K9423 Gastrostomy malfunction: Secondary | ICD-10-CM | POA: Insufficient documentation

## 2017-03-08 DIAGNOSIS — I110 Hypertensive heart disease with heart failure: Secondary | ICD-10-CM | POA: Diagnosis not present

## 2017-03-08 LAB — CBC WITH DIFFERENTIAL/PLATELET
BASOS PCT: 0 %
Basophils Absolute: 0 10*3/uL (ref 0.0–0.1)
Eosinophils Absolute: 0 10*3/uL (ref 0.0–0.7)
Eosinophils Relative: 0 %
LYMPHS PCT: 10 %
Lymphs Abs: 1.4 10*3/uL (ref 0.7–4.0)
MONOS PCT: 4 %
Monocytes Absolute: 0.6 10*3/uL (ref 0.1–1.0)
Neutro Abs: 12.6 10*3/uL — ABNORMAL HIGH (ref 1.7–7.7)
Neutrophils Relative %: 86 %

## 2017-03-08 LAB — CBC
HEMATOCRIT: 46.4 % (ref 39.0–52.0)
HEMOGLOBIN: 15.4 g/dL (ref 13.0–17.0)
MCH: 32 pg (ref 26.0–34.0)
MCHC: 33.2 g/dL (ref 30.0–36.0)
MCV: 96.3 fL (ref 78.0–100.0)
Platelets: 149 10*3/uL — ABNORMAL LOW (ref 150–400)
RBC: 4.82 MIL/uL (ref 4.22–5.81)
RDW: 14 % (ref 11.5–15.5)
WBC: 14.9 10*3/uL — ABNORMAL HIGH (ref 4.0–10.5)

## 2017-03-08 LAB — COMPREHENSIVE METABOLIC PANEL
ALBUMIN: 3.9 g/dL (ref 3.5–5.0)
ALT: 36 U/L (ref 17–63)
AST: 35 U/L (ref 15–41)
Alkaline Phosphatase: 91 U/L (ref 38–126)
Anion gap: 13 (ref 5–15)
BILIRUBIN TOTAL: 0.8 mg/dL (ref 0.3–1.2)
BUN: 24 mg/dL — AB (ref 6–20)
CHLORIDE: 93 mmol/L — AB (ref 101–111)
CO2: 39 mmol/L — AB (ref 22–32)
Calcium: 9.9 mg/dL (ref 8.9–10.3)
Creatinine, Ser: 1.29 mg/dL — ABNORMAL HIGH (ref 0.61–1.24)
GFR calc Af Amer: >60 mL/min (ref >60)
GFR calc non Af Amer: >60 mL/min (ref >60)
GLUCOSE: 211 mg/dL — AB (ref 65–99)
POTASSIUM: 3.1 mmol/L — AB (ref 3.5–5.1)
SODIUM: 145 mmol/L (ref 135–145)
TOTAL PROTEIN: 7.5 g/dL (ref 6.5–8.1)

## 2017-03-08 LAB — CBG MONITORING, ED: Glucose-Capillary: 219 mg/dL — ABNORMAL HIGH (ref 65–99)

## 2017-03-08 LAB — LIPASE, BLOOD: Lipase: 28 U/L (ref 11–51)

## 2017-03-08 MED ORDER — ONDANSETRON HCL 4 MG/2ML IJ SOLN
4.0000 mg | Freq: Once | INTRAMUSCULAR | Status: AC
  Administered 2017-03-08: 4 mg via INTRAVENOUS
  Filled 2017-03-08: qty 2

## 2017-03-08 MED ORDER — IOPAMIDOL (ISOVUE-300) INJECTION 61%
INTRAVENOUS | Status: AC
  Administered 2017-03-08: 30 mL
  Filled 2017-03-08: qty 30

## 2017-03-08 MED ORDER — IOPAMIDOL (ISOVUE-300) INJECTION 61%
100.0000 mL | Freq: Once | INTRAVENOUS | Status: AC | PRN
  Administered 2017-03-08: 100 mL via INTRAVENOUS

## 2017-03-08 NOTE — ED Notes (Signed)
Pt.'s O2 sats running 88-89% but pt will not let me put [place] on him. Stated, "No, youre not putting that on me."

## 2017-03-08 NOTE — ED Notes (Signed)
Have notified pt of need for urine sample. Pt states he doesn't have to pee. Offered the option of getting an in and out cath, but pt refused. States, "No, you're not doing that on me."

## 2017-03-08 NOTE — ED Triage Notes (Signed)
[facility]. Pt has history of anoxic brain injury.

## 2017-03-08 NOTE — ED Notes (Signed)
Pt to CT

## 2017-03-08 NOTE — ED Provider Notes (Signed)
Emergency Department Provider Note   I have reviewed the triage vital signs and the nursing notes.   HISTORY  Chief Complaint Emesis   HPI Edwin White is a 51 y.o. male history of CHF, coronary artery disease, reflux, hypertension, reflux and stroke presents the emergency department today secondary to vomiting. Patient is a poor historian but from nursing notes it seems that he has had vomiting for approximately 24 hours.patient states that he has not nauseous or have any pain at this time. States he uses G-tube for feeds and has been working appropriately.  Level V caveat secondary to baseline decreased ability to communicate.    Past Medical History:  Diagnosis Date  . CHF (congestive heart failure) (HCC)    No recurrent admissions for heart failure.  . Coronary artery disease    Status post coronary bypass grafting in 2007  . Crescendo transient ischemic attacks   . Functional bowel disorder    Bloating and abdominal cramping as well as constipation requiring lubiprostone  . Gastroparesis    Gastric emptying study scheduled  . GERD (gastroesophageal reflux disease)   . Heart attack [facility]) September 13, 2004   Followed by bypass surgery  . History of medication noncompliance   . Hyperlipidemia   . Hypertension    Poorly controlled  . Ischemic cardiomyopathy    Ejection fraction improved from 15%-20% to 40%-45% in 2012 by echocardiogram  . Moderate mitral regurgitation by prior echocardiogram   . NSTEMI (non-ST elevated myocardial infarction) (HCC) 12/31/2013  . Post-infarction apical thrombus (HCC)    Anticoagulation discontinued  . Stroke [facility]) 04/2012   denies residual on 12/31/2013  . Type 2 diabetes mellitus [facility])     Patient Active Problem List   Diagnosis Date Noted  . Hypertensive urgency   . Pulmonary edema 11/01/2016  . Acute on chronic combined systolic and diastolic heart failure (HCC)   . Apical mural thrombus   . Tobacco abuse   . Cerebrovascular  accident (CVA) due to thrombosis of cerebral artery (HCC) 02/05/2016  . 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 Type 2 diabetes mellitus with hyperglycemia, with long-term current use of insulin (HCC) 07/13/2015  . HLD (hyperlipidemia) 07/13/2015  . NSTEMI (non-ST elevated myocardial infarction) (HCC) 12/31/2013  . Hypokalemia 04/21/2012  . Thrombocytopenia (HCC) 04/20/2012  . Gastroparesis   . Ischemic cardiomyopathy   . Coronary artery disease   . Hypertension   . NONDEPENDENT TOBACCO USE DISORDER 05/24/2010  . 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 Chronic systolic congestive heart failure (HCC) 05/24/2010  . PVD WITH CLAUDICATION 05/24/2010  . COPD (chronic obstructive pulmonary disease) (HCC) 05/24/2010  . ESOPHAGEAL REFLUX 05/24/2010  . POSTSURGICAL AORTOCORONARY BYPASS STATUS 05/24/2010    Past Surgical History:  Procedure Laterality Date  . CORONARY ANGIOPLASTY WITH STENT PLACEMENT  2006  . CORONARY ARTERY BYPASS GRAFT  2007   "CABG X5"  . CORONARY/GRAFT ANGIOGRAPHY N/A 11/02/2016   Procedure: Coronary/Graft Angiography;  Surgeon: Yvonne Kendall, MD;  Location: [facility] INVASIVE CV LAB;  Service: Cardiovascular;  Laterality: N/A;  . LEFT HEART CATHETERIZATION WITH CORONARY/GRAFT ANGIOGRAM N/A 01/04/2014   Procedure: LEFT HEART CATHETERIZATION WITH Isabel Caprice;  Surgeon: Kathleene Hazel, MD;  Location: [facility] CATH LAB;  Service: Cardiovascular;  Laterality: N/A;  . PERCUTANEOUS CORONARY STENT INTERVENTION (PCI-S)  01/04/2014   Procedure: PERCUTANEOUS CORONARY STENT INTERVENTION (PCI-S);  Surgeon: Kathleene Hazel, MD;  Location: [facility] CATH LAB;  Service: Cardiovascular;;  Current Outpatient Rx  . Order #: [id number] Class: Normal  . Order #: [id number] Class: Normal  . Order #: [id number] Class: Normal  . Order #: [id number] Class: Normal  . Order #: [id number] Class: Normal  . Order #: [id number] Class: Normal  . Order  #: [id number] Class: Normal  . Order #: [id number] Class: Normal  . Order #: [id number] Class: Normal  . Order #: [id number] Class: Normal  . Order #: [id number] Class: Normal  . Order #: [id number] Class: Normal  . Order #: [id number] Class: Historical Med    Allergies Pravastatin  Family History  Problem Relation Age of Onset  . Adopted: Yes  . Coronary artery disease Father   . Cancer Mother     Social History Social History  Substance Use Topics  . Smoking status: Current Every Day Smoker    Packs/day: 1.00    Years: 36.00    Types: Cigarettes    Start date: 05/05/1979  . Smokeless tobacco: Never Used  . Alcohol use No     Comment: "quit drinking alcohol in 2006"    Review of Systems  All other systems negative except as documented in the HPI. All pertinent positives and negatives as reviewed in the HPI. ____________________________________________  PHYSICAL EXAM:  VITAL SIGNS: ED Triage Vitals  Enc Vitals Group     BP 03/08/17 1537 (!) 142/91     Pulse Rate 03/08/17 1537 89     Resp 03/08/17 1537 20     Temp 03/08/17 1537 98.8 F (37.1 C)     Temp src --      SpO2 03/08/17 1537 94 %     Weight 03/08/17 1536 224 lb (101.6 kg)     Height 03/08/17 1536  (1.676 m)     Head Circumference --      Peak Flow --      Pain Score --      Pain Loc --      Pain Edu? --      Excl. in GC? --     Constitutional: Alert and oriented. Well appearing and in no acute distress. Eyes: Conjunctivae are normal. PERRL. EOMI. Head: Atraumatic. Nose: No congestion/rhinnorhea. Mouth/Throat: Mucous membranes are moist.  Oropharynx non-erythematous. Neck: No stridor.  No meningeal signs.   Cardiovascular: Normal rate, regular rhythm. Good peripheral circulation. Grossly normal heart sounds.   Respiratory: Normal respiratory effort.  No retractions. Lungs CTAB. Gastrointestinal: Soft and nontender. No distention.  Musculoskeletal: No lower extremity tenderness nor edema. No gross  deformities of extremities. Neurologic:  Normal speech and language. No gross focal neurologic deficits are appreciated.  Skin:  Skin is warm, dry and intact. No rash noted.  ____________________________________________   LABS (all labs ordered are listed, but only abnormal results are displayed)  Labs Reviewed  COMPREHENSIVE METABOLIC PANEL - Abnormal; Notable for the following:       Result Value   Potassium 3.1 (*)    Chloride 93 (*)    CO2 39 (*)    Glucose, Bld 211 (*)    BUN 24 (*)    Creatinine, Ser 1.29 (*)    All other components within normal limits  CBC - Abnormal; Notable for the following:    WBC 14.9 (*)    Platelets 149 (*)    All other components within normal limits  CBC WITH DIFFERENTIAL/PLATELET - Abnormal; Notable for the following:    Neutro Abs 12.6 (*)    All other components within normal limits  CBG MONITORING, ED - Abnormal; Notable for the following:  Glucose-Capillary 219 (*)    All other components within normal limits  LIPASE, BLOOD  URINALYSIS, ROUTINE W REFLEX MICROSCOPIC   ____________________________________________  EKG   EKG Interpretation  Date/Time:  Friday March 08 2017 16:29:43 EDT Ventricular Rate:  85 PR Interval:    QRS Duration: 104 QT Interval:  436 QTC Calculation: 519 R Axis:   -20 Text Interpretation:  Sinus rhythm Borderline left axis deviation Borderline repolarization abnormality Prolonged QT interval Confirmed by Marily Memos [phone]) on 03/08/2017 5:32:12 PM       ____________________________________________  RADIOLOGY  Ct Abdomen Pelvis W Contrast  Result Date: 03/08/2017 CLINICAL DATA:  Nausea and vomiting history of anoxic brain injury EXAM: CT ABDOMEN AND PELVIS WITH CONTRAST TECHNIQUE: Multidetector CT imaging of the abdomen and pelvis was performed using the standard protocol following bolus administration of intravenous contrast. CONTRAST:  ISOVUE-300 IOPAMIDOL (ISOVUE-300) INJECTION 61%  COMPARISON:  04/14/2014 FINDINGS: Lower chest: Lung bases demonstrate patchy dependent atelectasis. Bleb or cyst in the left lung base. Mild cardiomegaly. Coronary artery calcification. Hepatobiliary: No focal liver abnormality is seen. No biliary dilatation. Possible tiny stones in the gallbladder. Pancreas: Unremarkable. No pancreatic ductal dilatation or surrounding inflammatory changes. Spleen: Normal in size without focal abnormality. Adrenals/Urinary Tract: Stable 13 mm right adrenal gland nodule. Left adrenal gland is normal. Intrarenal vascular calcification. Scarring in the left kidney. Multiple subcentimeter cortical hypodensities too small to further characterize. Negative for hydronephrosis. The bladder is unremarkable Stomach/Bowel: Stomach is moderately dilated with fluid. Gastrostomy tube is present, the retention balloon appears seated at the gastroduodenal junction. There is no dilated small bowel. Normal appendix. Few sigmoid colon diverticula. No acute wall inflammation Vascular/Lymphatic: Heavily calcified aorta. No aneurysmal dilatation. No significantly enlarged lymph nodes. Reproductive: Prostate is unremarkable. Other: Negative for free air or free fluid. Small fat in the inguinal canals. Musculoskeletal: Degenerative changes of the spine. No acute osseus abnormality. Post sternotomy changes IMPRESSION: 1. Moderate gastric enlargement with large amount of fluid in the stomach. Retention balloon for the gastrostomy tube appears seated at the gastroduodenal junction, and suspect that this is causing gastric outlet obstruction. Repositioning is recommended. 2. No small bowel dilatation to suggest small bowel obstruction 3. Stable 13 mm right adrenal gland nodule, likely adenoma Electronically Signed   By: Jasmine Pang M.D.   On: 03/08/2017 18:47   ____________________________________________   PROCEDURES  Procedure(s) performed:    Procedures ____________________________________________   INITIAL IMPRESSION / ASSESSMENT AND PLAN / ED COURSE  Pertinent labs & imaging results that were available during my care of the patient were reviewed by me and considered in my medical decision making (see chart for details).  Difficult historian but patient having vomiting and some pain possibly. We'll be conservative and look into cardiac and abdominal causes for her symptoms with labs, CT and EKG. For now we'll give Zofran.  CT scan showed that the patient had a likely gastric outlet obstruction secondary to his gastrostomy tube being lodged in the pylorus. There was deflated and pulled with moderate amount of traction and I could feel the resistance give loose which I suspect is the tube coming out of the pylorus. Patient able to tolerate by mouth after this. Will have follow-up with primary doctor to recheck labs and ability to tolerate by mouth. I expect potassium to improve with decreased vomiting and he is on potassium supplementation currently already.   ____________________________________________  FINAL CLINICAL IMPRESSION(S) / ED DIAGNOSES  Final diagnoses:  Non-intractable vomiting with nausea, unspecified vomiting  type    MEDICATIONS GIVEN DURING THIS VISIT:  Medications  ondansetron (ZOFRAN) injection 4 mg (4 mg Intravenous Given 03/08/17 1624)  iopamidol (ISOVUE-300) 61 % injection (30 mLs  Contrast Given 03/08/17 1748)  iopamidol (ISOVUE-300) 61 % injection 100 mL (100 mLs Intravenous Contrast Given 03/08/17 1819)    NEW OUTPATIENT MEDICATIONS STARTED DURING THIS VISIT:  New Prescriptions   No medications on file    Note:  This document was prepared using Dragon voice recognition software and may include unintentional dictation errors.    Marily MemosMesner, Cormac Wint, MD 03/08/17 2052

## 2017-03-08 NOTE — ED Notes (Signed)
EMS called to take pt back to nursing home

## 2017-03-08 NOTE — ED Notes (Signed)
Called Pharmacy to verify the use of contrast through peg tube

## 2017-03-08 NOTE — ED Notes (Signed)
800 CC bolus contrast and sterile water infused in peg tub

## 2017-03-08 NOTE — Discharge Instructions (Signed)
I think the reason for your vomiting was that your gastric tube was seated against the part of the stomach where food and fluids go down into her intestines. This caused you to not feel the anterior stomach and thus you or vomiting.. Gastric tube has been adjusted and urinalysis tolerating fluids without difficulties suspect this should relieve itself.Please follow-up with your doctor in a couple days to ensure improvement in symptoms.

## 2017-05-02 NOTE — Progress Notes (Signed)
Cardiology Office Note   Date:  05/10/2017   ID:  [name], [name] 06/07/1966, MRN [id number]  PCP:  Dettinger, Elige Radon, MD  Cardiologist: Dr.Koneswaran  Chief Complaint  Patient presents with  . Hospitalization Follow-up  . Coronary Artery Disease    History of Present Illness: Edwin White is a 51 y.o. male who presents for posthospitalization follow-up after admission for chest pain and dyspnea which occurred suddenly prior to admission.  Other history includes chronic systolic CHF with ischemic heart myopathy, EF 10% to 15%, coronary artery disease with stent placement in 2000, and CABG in 2007, history of non-ST elevated MI with bare-metal stent to the circumflex in 2015, apical thrombus and has been on Xarelto since 2015, other history includes moderate mitral regurg, medical noncompliance, type 2 diabetes, GERD,.  The patient ultimately underwent cardiac catheterization  On 11/02/2016 which revealed severe native CAD since 2015, patent stent in the left circumflex, patent LIMA to LAD, SVG to OM, with SVG to PDA chronically occluded.  He was to continue medical therapy.  LV systolic function was unchanged.  He comes today without any complaints.  He is currently a resident of [facility] skilled nursing facility.  He is unable to tell me if he is having chest discomfort or shortness of breath.  He denies any symptoms whatsoever.  He apparently is very sedentary.  Multiple medications have been changed by Dr. Eden Emms at facility.  Past Medical History:  Diagnosis Date  . CHF (congestive heart failure) (HCC)    No recurrent admissions for heart failure.  . Coronary artery disease    Status post coronary bypass grafting in 2007  . Crescendo transient ischemic attacks   . Functional bowel disorder    Bloating and abdominal cramping as well as constipation requiring lubiprostone  . Gastroparesis    Gastric emptying study scheduled  . GERD (gastroesophageal reflux disease)   .  Heart attack [facility]) September 13, 2004   Followed by bypass surgery  . History of medication noncompliance   . Hyperlipidemia   . Hypertension    Poorly controlled  . Ischemic cardiomyopathy    Ejection fraction improved from 15%-20% to 40%-45% in 2012 by echocardiogram  . Moderate mitral regurgitation by prior echocardiogram   . NSTEMI (non-ST elevated myocardial infarction) (HCC) 12/31/2013  . Post-infarction apical thrombus (HCC)    Anticoagulation discontinued  . Stroke [facility]) 04/2012   denies residual on 12/31/2013  . Type 2 diabetes mellitus (HCC)     Past Surgical History:  Procedure Laterality Date  . CORONARY ANGIOPLASTY WITH STENT PLACEMENT  2006  . CORONARY ARTERY BYPASS GRAFT  2007   "CABG X5"     Current Outpatient Medications  Medication Sig Dispense Refill  . aspirin 81 MG EC tablet TAKE 1 TABLET BY MOUTH EVERY DAY 90 tablet 1  . atorvastatin (LIPITOR) 80 MG tablet Take 1 tablet (80 mg total) by mouth daily. 30 tablet 5  . carvedilol (COREG) 12.5 MG tablet Take 1 tablet (12.5 mg total) by mouth 2 (two) times daily. 60 tablet 5  . clindamycin (CLEOCIN) 300 MG capsule Take 300 mg 3 (three) times daily by mouth.    . Dextromethorphan-Guaifenesin (MUCINEX COUGH/KIDS) 5-100 MG PACK Take 100 mg daily by mouth.    . ergocalciferol (VITAMIN D2) 50000 units capsule Take 50,000 Units once a week by mouth.    . furosemide (LASIX) 20 MG tablet Take 20 mg 2 (two) times daily by mouth.    Marland Kitchen  Insulin Glargine (LANTUS SOLOSTAR) 100 UNIT/ML Solostar Pen Inject 20 Units into the skin daily at 10 pm. 5 pen 5  . insulin lispro (HUMALOG) 100 UNIT/ML injection Inject 15 Units into the skin.     Marland Kitchen. ipratropium-albuterol (DUONEB) 0.5-2.5 (3) MG/3ML SOLN 3 mLs.    Marland Kitchen. lisinopril (PRINIVIL,ZESTRIL) 40 MG tablet Take 1 tablet (40 mg total) by mouth daily. 30 tablet 5  . Multiple Vitamin (MULTIVITAMIN) capsule Take 1 capsule daily by mouth.    . nitroGLYCERIN (NITROSTAT) 0.4 MG SL tablet Place 1 tablet  (0.4 mg total) under the tongue every 5 (five) minutes x 3 doses as needed for chest pain. 25 tablet 2  . rivaroxaban (XARELTO) 20 MG TABS tablet Take 1 tablet (20 mg total) by mouth daily with supper. 30 tablet 11  . senna (SENOKOT) 8.6 MG tablet Take 1 tablet 2 (two) times daily by mouth.     No current facility-administered medications for this visit.     Allergies:   Pravastatin    Social History:  The patient  reports that he has been smoking cigarettes.  He started smoking about 38 years ago. He has a 36.00 pack-year smoking history. he has never used smokeless tobacco. He reports that he does not drink alcohol or use drugs.   Family History:  The patient's family history includes Cancer in his mother; Coronary artery disease in his father. He was adopted.    ROS: All other systems are reviewed and negative. Unless otherwise mentioned in H&P    PHYSICAL EXAM: VS:  BP 124/72 (BP Location: Left Arm)   Pulse 87   Ht 5\' 10"  (1.778 m)   Wt 210 lb 9.6 oz (95.5 kg)   SpO2 93%   BMI 30.22 kg/m  , BMI Body mass index is 30.22 kg/m. GEN: Well nourished, well developed, in no acute distress  HEENT: normal  Neck: no JVD, carotid bruits, or masses Cardiac: RRR; no murmurs, rubs, or gallops, 2+ pitting edema on the left, 1+ on the right, dependent edema  Respiratory:  clear to auscultation bilaterally, normal work of breathing GI: soft, nontender, nondistended, + BS MS: no deformity or atrophy  Skin: warm and dry, no rash Neuro:  Strength and sensation are intact Psych: euthymic mood, full affect.  The patient is a flat affect, poor memory, and is unable to express any new or ongoing symptoms.   Recent Labs: 10/31/2016: B Natriuretic Peptide 732.0 03/08/2017: ALT 36; BUN 24; Creatinine, Ser 1.29; Hemoglobin 15.4; Hemoglobin DUPLICATE; Platelets 149; Platelets DUPLICATE; Potassium 3.1; Sodium 145    Lipid Panel    Component Value Date/Time   CHOL 214 (H) 04/11/2016 1107   TRIG 264  (H) 04/11/2016 1107   HDL 32 (L) 04/11/2016 1107   CHOLHDL 6.7 (H) 04/11/2016 1107   CHOLHDL 6.7 02/06/2016 0557   VLDL 40 02/06/2016 0557   LDLCALC 129 (H) 04/11/2016 1107      Wt Readings from Last 3 Encounters:  05/10/17 210 lb 9.6 oz (95.5 kg)  03/08/17 224 lb (101.6 kg)  11/04/16 224 lb 6.9 oz (101.8 kg)      Other studies Reviewed: Coronary/Graft Angiography 11/04/2016  Conclusion   Conclusions: 1. Severe native CAD, stable since 2015. 2. Patent sent in LCx. 3. Patent LIMA->LAD and SVG->OM. SVG-PDA remains chronically occluded.  Recommendations: 1. Medical therapy.    ASSESSMENT AND PLAN:  1.  Coronary artery disease: History of CABG with most recent cardiac catheterization as above.  The patient needs to  continue on current medication regimen which includes statin therapy, ACE inhibitor.  He is on carvedilol twice daily .Marland Kitchen  Heart rate is well controlled.  He is unable to express any new symptoms or ongoing symptoms.  He appears stable from a iliac standpoint currently.  I am not going to make any changes at this time as he appears to be doing well.  2.  Hypertension: Excellent control of blood pressure.  No changes  3.  History of apical thrombus: Remains on Xarelto.  He does not express any complaints of bleeding or melena.  4.  Chronic systolic CHF: Mains on Lasix and has added Aldactone at skilled nursing facility.  I have reviewed labs.  Kidney function is stable.  Current medicines are reviewed at length with the patient today.    Labs/ tests ordered today include: None.  He will follow-up in the [place] office in 6 months unless he is symptomatic.  Bettey Mare. Liborio Nixon, ANP, AACC   05/10/2017 2:14 PM    [facility] [address] [place], [place] [address] Phone: [phone]; Fax: [phone]

## 2017-05-10 ENCOUNTER — Ambulatory Visit (INDEPENDENT_AMBULATORY_CARE_PROVIDER_SITE_OTHER): Payer: Self-pay | Admitting: Adult Health

## 2017-05-10 ENCOUNTER — Encounter: Payer: Self-pay | Admitting: Adult Health

## 2017-05-10 VITALS — BP 124/72 | HR 87 | Ht 70.0 in | Wt 210.6 lb

## 2017-05-10 DIAGNOSIS — I2510 Atherosclerotic heart disease of native coronary artery without angina pectoris: Secondary | ICD-10-CM

## 2017-05-10 DIAGNOSIS — I2129 ST elevation (STEMI) myocardial infarction involving other sites: Secondary | ICD-10-CM

## 2017-05-10 DIAGNOSIS — I255 Ischemic cardiomyopathy: Secondary | ICD-10-CM

## 2017-05-10 DIAGNOSIS — I513 Intracardiac thrombosis, not elsewhere classified: Secondary | ICD-10-CM

## 2017-05-10 NOTE — Patient Instructions (Signed)

## 2017-11-05 ENCOUNTER — Other Ambulatory Visit: Payer: Self-pay | Admitting: Cardiology

## 2017-11-05 DIAGNOSIS — I10 Essential (primary) hypertension: Secondary | ICD-10-CM

## 2017-11-08 ENCOUNTER — Encounter: Payer: Self-pay | Admitting: Cardiovascular Disease

## 2017-11-08 ENCOUNTER — Ambulatory Visit (INDEPENDENT_AMBULATORY_CARE_PROVIDER_SITE_OTHER): Payer: Medicaid Other | Admitting: Cardiovascular Disease

## 2017-11-08 ENCOUNTER — Other Ambulatory Visit: Payer: Self-pay

## 2017-11-08 VITALS — BP 122/80 | HR 75 | Ht 72.0 in | Wt 239.0 lb

## 2017-11-08 DIAGNOSIS — I5022 Chronic systolic (congestive) heart failure: Secondary | ICD-10-CM

## 2017-11-08 DIAGNOSIS — E785 Hyperlipidemia, unspecified: Secondary | ICD-10-CM

## 2017-11-08 DIAGNOSIS — I25708 Atherosclerosis of coronary artery bypass graft(s), unspecified, with other forms of angina pectoris: Secondary | ICD-10-CM

## 2017-11-08 DIAGNOSIS — I10 Essential (primary) hypertension: Secondary | ICD-10-CM | POA: Diagnosis not present

## 2017-11-08 DIAGNOSIS — I513 Intracardiac thrombosis, not elsewhere classified: Secondary | ICD-10-CM | POA: Diagnosis not present

## 2017-11-08 NOTE — Patient Instructions (Signed)

## 2017-11-08 NOTE — Progress Notes (Signed)
SUBJECTIVE: The patient presents for follow-up of coronary artery disease and chronic systolic heart failure.  I have not evaluated him since November 2016.  He was hospitalized for a non-STEMI in May 2018.  He underwent coronary angiography which demonstrated severe native coronary disease, stable since 2015.  There is a patent stent in the left circumflex.  There is a patent LIMA to the LAD and SVG to the OM.  The SVG to the PDA remained chronically occluded.  Medical therapy was recommended.  I also reviewed the echocardiogram performed on 11/02/2016 which demonstrated severely reduced left ventricular systolic function, LVEF 20%, global hypokinesis with additional wall motion abnormalities, and apical thrombus was suspected.  He was discharged on aspirin, Xarelto, lisinopril, Lasix 80 mg daily, Lipitor 80 mg, hydralazine as needed, and carvedilol 12.5 mg twice daily.  It appears he is now taking Lasix 20 mg twice daily.  The patient denies any symptoms of chest pain, palpitations, shortness of breath, lightheadedness, dizziness, leg swelling, orthopnea, PND, and syncope.  I reviewed notes by his physician, Dr. Eden Emms, who apparently rounds at [facility] where he resides.    Review of Systems: As per "subjective", otherwise negative.  Allergies  Allergen Reactions  . Pravastatin Other (See Comments)    PT CANNOT NOT WALK OR MOVE.    Current Outpatient Medications  Medication Sig Dispense Refill  . amLODipine (NORVASC) 10 MG tablet Take 10 mg by mouth daily.    Marland Kitchen aspirin 81 MG EC tablet TAKE 1 TABLET BY MOUTH EVERY DAY 90 tablet 1  . atorvastatin (LIPITOR) 80 MG tablet Take 1 tablet (80 mg total) by mouth daily. 30 tablet 5  . carvedilol (COREG) 12.5 MG tablet Take 1 tablet (12.5 mg total) by mouth 2 (two) times daily. 60 tablet 5  . ergocalciferol (VITAMIN D2) 50000 units capsule Take 50,000 Units once a week by mouth.    . furosemide (LASIX) 20 MG tablet Take 20 mg 2 (two)  times daily by mouth.    . Insulin Glargine (LANTUS SOLOSTAR) 100 UNIT/ML Solostar Pen Inject 20 Units into the skin daily at 10 pm. 5 pen 5  . insulin lispro (HUMALOG) 100 UNIT/ML injection Inject 15 Units into the skin.     Marland Kitchen ipratropium-albuterol (DUONEB) 0.5-2.5 (3) MG/3ML SOLN 3 mLs.    Marland Kitchen lisinopril (PRINIVIL,ZESTRIL) 20 MG tablet Take 20 mg by mouth daily.    . metFORMIN (GLUCOPHAGE) 500 MG tablet Take by mouth 2 (two) times daily with a meal.    . Multiple Vitamin (MULTIVITAMIN) capsule Take 1 capsule daily by mouth.    . nitroGLYCERIN (NITROSTAT) 0.4 MG SL tablet DISSOLVE 1 TABLET UNDER THE TONGUE EVERY 5 MINUTES AS NEEDED FOR CHEST PAIN. DO NOT EXCEED A TOTAL OF 3 DOSES IN 15 MINUTES. 25 tablet 2  . senna (SENOKOT) 8.6 MG tablet Take 1 tablet 2 (two) times daily by mouth.    . spironolactone (ALDACTONE) 25 MG tablet Take 12.5 mg by mouth daily.    Carlena Hurl 20 MG TABS tablet TAKE 1 TABLET BY MOUTH EVERY DAY WITH SUPPER 30 tablet 11   No current facility-administered medications for this visit.     Past Medical History:  Diagnosis Date  . CHF (congestive heart failure) (HCC)    No recurrent admissions for heart failure.  . Coronary artery disease    Status post coronary bypass grafting in 2007  . Crescendo transient ischemic attacks   . Functional bowel disorder  Bloating and abdominal cramping as well as constipation requiring lubiprostone  . Gastroparesis    Gastric emptying study scheduled  . GERD (gastroesophageal reflux disease)   . Heart attack [facility]) September 13, 2004   Followed by bypass surgery  . History of medication noncompliance   . Hyperlipidemia   . Hypertension    Poorly controlled  . Ischemic cardiomyopathy    Ejection fraction improved from 15%-20% to 40%-45% in 2012 by echocardiogram  . Moderate mitral regurgitation by prior echocardiogram   . NSTEMI (non-ST elevated myocardial infarction) (HCC) 12/31/2013  . Post-infarction apical thrombus (HCC)     Anticoagulation discontinued  . Stroke [facility]) 04/2012   denies residual on 12/31/2013  . Type 2 diabetes mellitus (HCC)     Past Surgical History:  Procedure Laterality Date  . CORONARY ANGIOPLASTY WITH STENT PLACEMENT  2006  . CORONARY ARTERY BYPASS GRAFT  2007   "CABG X5"  . CORONARY/GRAFT ANGIOGRAPHY N/A 11/02/2016   Procedure: Coronary/Graft Angiography;  Surgeon: Yvonne Kendall, MD;  Location: [facility] INVASIVE CV LAB;  Service: Cardiovascular;  Laterality: N/A;  . LEFT HEART CATHETERIZATION WITH CORONARY/GRAFT ANGIOGRAM N/A 01/04/2014   Procedure: LEFT HEART CATHETERIZATION WITH Isabel Caprice;  Surgeon: Kathleene Hazel, MD;  Location: [facility] CATH LAB;  Service: Cardiovascular;  Laterality: N/A;  . PERCUTANEOUS CORONARY STENT INTERVENTION (PCI-S)  01/04/2014   Procedure: PERCUTANEOUS CORONARY STENT INTERVENTION (PCI-S);  Surgeon: Kathleene Hazel, MD;  Location: [facility] CATH LAB;  Service: Cardiovascular;;    Social History   Socioeconomic History  . Marital status: Legally Separated    Spouse name: Not on file  . Number of children: Not on file  . Years of education: Not on file  . Highest education level: Not on file  Occupational History  . Not on file  Social Needs  . Financial resource strain: Not on file  . Food insecurity:    Worry: Not on file    Inability: Not on file  . Transportation needs:    Medical: Not on file    Non-medical: Not on file  Tobacco Use  . Smoking status: Current Every Day Smoker    Packs/day: 0.50    Years: 36.00    Pack years: 18.00    Types: Cigarettes    Start date: 05/05/1979  . Smokeless tobacco: Never Used  Substance and Sexual Activity  . Alcohol use: No    Alcohol/week: 0.0 oz    Comment: "quit drinking alcohol in 2006"  . Drug use: No  . Sexual activity: Not Currently  Lifestyle  . Physical activity:    Days per week: Not on file    Minutes per session: Not on file  . Stress: Not on file  Relationships  . Social  connections:    Talks on phone: Not on file    Gets together: Not on file    Attends religious service: Not on file    Active member of club or organization: Not on file    Attends meetings of clubs or organizations: Not on file    Relationship status: Not on file  . Intimate partner violence:    Fear of current or ex partner: Not on file    Emotionally abused: Not on file    Physically abused: Not on file    Forced sexual activity: Not on file  Other Topics Concern  . Not on file  Social History Narrative   Disabled.      Vitals:   11/08/17 1003  BP:  122/80  Pulse: 75  SpO2: 100%  Weight: 239 lb (108.4 kg)  Height: 6' (1.829 m)    Wt Readings from Last 3 Encounters:  11/08/17 239 lb (108.4 kg)  05/10/17 210 lb 9.6 oz (95.5 kg)  03/08/17 224 lb (101.6 kg)     PHYSICAL EXAM General: NAD HEENT: Normal. Neck: No JVD, no thyromegaly. Lungs: Clear to auscultation bilaterally with normal respiratory effort. CV: Regular rate and rhythm, normal S1/S2, no S3/S4, no murmur. No pretibial or periankle edema.  No carotid bruit.   Abdomen: Soft, nontender, no distention.  Neurologic: Alert and oriented.  Psych: Normal affect. Skin: Normal. Musculoskeletal: No gross deformities.    ECG: Most recent ECG reviewed.   Labs: Lab Results  Component Value Date/Time   K 3.1 (L) 03/08/2017 03:40 PM   BUN 24 (H) 03/08/2017 03:40 PM   BUN 14 08/15/2016 10:10 AM   CREATININE 1.29 (H) 03/08/2017 03:40 PM   ALT 36 03/08/2017 03:40 PM   HGB 15.4 03/08/2017 03:40 PM   HGB DUPLICATE 03/08/2017 03:40 PM   HGB 15.6 04/11/2016 11:07 AM     Lipids: Lab Results  Component Value Date/Time   LDLCALC 129 (H) 04/11/2016 11:07 AM   CHOL 214 (H) 04/11/2016 11:07 AM   TRIG 264 (H) 04/11/2016 11:07 AM   HDL 32 (L) 04/11/2016 11:07 AM       ASSESSMENT AND PLAN: 1.  Chronic systolic heart failure: Symptomatically stable.  Currently on spironolactone 12.5 mg daily, lisinopril 20 mg daily,  Lasix 20 mg twice daily, and carvedilol 12.5 mg twice daily.  2.  Coronary artery disease: Symptomatically stable.  Currently on aspirin, Lipitor, carvedilol, and lisinopril.  3.  Apical thrombus: He is on Xarelto although this is not FDA approved for this indication.  4.  Hypertension: Blood pressure is controlled.  No changes to therapy.  5.  Hyperlipidemia: Continue high intensity statin therapy with Lipitor 80 mg.    Disposition: Follow up 6 months   Time spent: 40 minutes, of which greater than 50% was spent reviewing symptoms, relevant blood tests and studies, and discussing management plan with the patient.   Prentice Docker, M.D., F.A.C.C.

## 2018-05-27 ENCOUNTER — Emergency Department (HOSPITAL_COMMUNITY): Payer: Medicaid Other

## 2018-05-27 ENCOUNTER — Encounter (HOSPITAL_COMMUNITY): Payer: Self-pay | Admitting: Emergency Medicine

## 2018-05-27 ENCOUNTER — Observation Stay (HOSPITAL_COMMUNITY)
Admission: EM | Admit: 2018-05-27 | Discharge: 2018-05-28 | Disposition: A | Discharge status: Home / Self Care | Payer: Medicaid Other | Attending: Family Medicine | Admitting: Family Medicine

## 2018-05-27 ENCOUNTER — Other Ambulatory Visit: Payer: Self-pay

## 2018-05-27 DIAGNOSIS — Z7982 Long term (current) use of aspirin: Secondary | ICD-10-CM | POA: Diagnosis not present

## 2018-05-27 DIAGNOSIS — Z79899 Other long term (current) drug therapy: Secondary | ICD-10-CM | POA: Insufficient documentation

## 2018-05-27 DIAGNOSIS — R252 Cramp and spasm: Secondary | ICD-10-CM | POA: Diagnosis present

## 2018-05-27 DIAGNOSIS — I2510 Atherosclerotic heart disease of native coronary artery without angina pectoris: Secondary | ICD-10-CM | POA: Diagnosis not present

## 2018-05-27 DIAGNOSIS — G253 Myoclonus: Secondary | ICD-10-CM | POA: Diagnosis not present

## 2018-05-27 DIAGNOSIS — I5022 Chronic systolic (congestive) heart failure: Secondary | ICD-10-CM | POA: Diagnosis not present

## 2018-05-27 DIAGNOSIS — R200 Anesthesia of skin: Secondary | ICD-10-CM | POA: Diagnosis present

## 2018-05-27 DIAGNOSIS — Z794 Long term (current) use of insulin: Secondary | ICD-10-CM | POA: Diagnosis not present

## 2018-05-27 DIAGNOSIS — E1165 Type 2 diabetes mellitus with hyperglycemia: Secondary | ICD-10-CM | POA: Diagnosis not present

## 2018-05-27 DIAGNOSIS — I513 Intracardiac thrombosis, not elsewhere classified: Secondary | ICD-10-CM | POA: Diagnosis present

## 2018-05-27 DIAGNOSIS — J449 Chronic obstructive pulmonary disease, unspecified: Secondary | ICD-10-CM | POA: Diagnosis present

## 2018-05-27 DIAGNOSIS — F17210 Nicotine dependence, cigarettes, uncomplicated: Secondary | ICD-10-CM | POA: Diagnosis not present

## 2018-05-27 LAB — COMPREHENSIVE METABOLIC PANEL
ALBUMIN: 4 g/dL (ref 3.5–5.0)
ALT: 32 U/L (ref 0–44)
AST: 34 U/L (ref 15–41)
Alkaline Phosphatase: 82 U/L (ref 38–126)
Anion gap: 11 (ref 5–15)
BUN: 25 mg/dL — AB (ref 6–20)
CHLORIDE: 105 mmol/L (ref 98–111)
CO2: 23 mmol/L (ref 22–32)
Calcium: 9.7 mg/dL (ref 8.9–10.3)
Creatinine, Ser: 1.26 mg/dL — ABNORMAL HIGH (ref 0.61–1.24)
GFR calc Af Amer: >60 mL/min (ref >60)
GFR calc non Af Amer: >60 mL/min (ref >60)
GLUCOSE: 192 mg/dL — AB (ref 70–99)
POTASSIUM: 4.6 mmol/L (ref 3.5–5.1)
Sodium: 139 mmol/L (ref 135–145)
Total Bilirubin: 0.9 mg/dL (ref 0.3–1.2)
Total Protein: 7.4 g/dL (ref 6.5–8.1)

## 2018-05-27 LAB — CBC WITH DIFFERENTIAL/PLATELET
Abs Immature Granulocytes: 0.04 10*3/uL (ref 0.00–0.07)
BASOS ABS: 0 10*3/uL (ref 0.0–0.1)
BASOS PCT: 0 %
EOS ABS: 0.1 10*3/uL (ref 0.0–0.5)
Eosinophils Relative: 1 %
HCT: 38.6 % — ABNORMAL LOW (ref 39.0–52.0)
Hemoglobin: 12.7 g/dL — ABNORMAL LOW (ref 13.0–17.0)
IMMATURE GRANULOCYTES: 0 %
Lymphocytes Relative: 19 %
Lymphs Abs: 1.8 10*3/uL (ref 0.7–4.0)
MCH: 30.8 pg (ref 26.0–34.0)
MCHC: 32.9 g/dL (ref 30.0–36.0)
MCV: 93.5 fL (ref 80.0–100.0)
Monocytes Absolute: 0.6 10*3/uL (ref 0.1–1.0)
Monocytes Relative: 7 %
NEUTROS PCT: 73 %
NRBC: 0 % (ref 0.0–0.2)
Neutro Abs: 6.9 10*3/uL (ref 1.7–7.7)
PLATELETS: 141 10*3/uL — AB (ref 150–400)
RBC: 4.13 MIL/uL — AB (ref 4.22–5.81)
RDW: 13.2 % (ref 11.5–15.5)
WBC: 9.6 10*3/uL (ref 4.0–10.5)

## 2018-05-27 LAB — PHOSPHORUS: Phosphorus: 3.5 mg/dL (ref 2.5–4.6)

## 2018-05-27 LAB — MAGNESIUM: MAGNESIUM: 1.9 mg/dL (ref 1.7–2.4)

## 2018-05-27 LAB — MRSA PCR SCREENING: MRSA BY PCR: NEGATIVE

## 2018-05-27 LAB — GLUCOSE, CAPILLARY: GLUCOSE-CAPILLARY: 170 mg/dL — AB (ref 70–99)

## 2018-05-27 MED ORDER — ASPIRIN EC 81 MG PO TBEC
81.0000 mg | DELAYED_RELEASE_TABLET | Freq: Every day | ORAL | Status: DC
  Administered 2018-05-27 – 2018-05-28 (×2): 81 mg via ORAL
  Filled 2018-05-27 (×2): qty 1

## 2018-05-27 MED ORDER — ONDANSETRON HCL 4 MG PO TABS: 4.0000 mg | ORAL_TABLET | Freq: Four times a day (QID) | ORAL | Status: DC | PRN

## 2018-05-27 MED ORDER — RIVAROXABAN 20 MG PO TABS
20.0000 mg | ORAL_TABLET | Freq: Every day | ORAL | Status: DC
  Administered 2018-05-27: 20 mg via ORAL
  Filled 2018-05-27: qty 1

## 2018-05-27 MED ORDER — AMLODIPINE BESYLATE 5 MG PO TABS
10.0000 mg | ORAL_TABLET | Freq: Every day | ORAL | Status: DC
  Administered 2018-05-27 – 2018-05-28 (×2): 10 mg via ORAL
  Filled 2018-05-27 (×2): qty 2

## 2018-05-27 MED ORDER — ONDANSETRON HCL 4 MG/2ML IJ SOLN: 4.0000 mg | Freq: Four times a day (QID) | INTRAMUSCULAR | Status: DC | PRN

## 2018-05-27 MED ORDER — LISINOPRIL 10 MG PO TABS
20.0000 mg | ORAL_TABLET | Freq: Every day | ORAL | Status: DC
  Administered 2018-05-27 – 2018-05-28 (×2): 20 mg via ORAL
  Filled 2018-05-27 (×2): qty 2

## 2018-05-27 MED ORDER — LEVETIRACETAM IN NACL 500 MG/100ML IV SOLN
500.0000 mg | Freq: Two times a day (BID) | INTRAVENOUS | Status: DC
  Administered 2018-05-28: 500 mg via INTRAVENOUS
  Filled 2018-05-27 (×2): qty 100

## 2018-05-27 MED ORDER — ACETAMINOPHEN 325 MG PO TABS
650.0000 mg | ORAL_TABLET | Freq: Four times a day (QID) | ORAL | Status: DC | PRN
  Filled 2018-05-27: qty 2

## 2018-05-27 MED ORDER — IPRATROPIUM-ALBUTEROL 0.5-2.5 (3) MG/3ML IN SOLN: 3.0000 mL | Freq: Four times a day (QID) | RESPIRATORY_TRACT | Status: DC

## 2018-05-27 MED ORDER — POLYETHYLENE GLYCOL 3350 17 G PO PACK: 17.0000 g | PACK | Freq: Every day | ORAL | Status: DC | PRN

## 2018-05-27 MED ORDER — SPIRONOLACTONE 25 MG PO TABS
12.5000 mg | ORAL_TABLET | Freq: Every day | ORAL | Status: DC
  Administered 2018-05-28: 12.5 mg via ORAL
  Filled 2018-05-27: qty 1
  Filled 2018-05-27: qty 0.5
  Filled 2018-05-27 (×2): qty 1
  Filled 2018-05-27: qty 0.5

## 2018-05-27 MED ORDER — ATORVASTATIN CALCIUM 40 MG PO TABS
80.0000 mg | ORAL_TABLET | Freq: Every day | ORAL | Status: DC
Start: 2018-05-27 — End: 2018-05-28
  Administered 2018-05-27: 80 mg via ORAL
  Filled 2018-05-27: qty 2

## 2018-05-27 MED ORDER — INSULIN GLARGINE 100 UNIT/ML ~~LOC~~ SOLN
15.0000 [IU] | Freq: Every day | SUBCUTANEOUS | Status: DC
  Administered 2018-05-27: 15 [IU] via SUBCUTANEOUS
  Filled 2018-05-27 (×3): qty 0.15

## 2018-05-27 MED ORDER — SODIUM CHLORIDE 0.9 % IV SOLN
INTRAVENOUS | Status: DC | PRN
  Administered 2018-05-27: 250 mL via INTRAVENOUS

## 2018-05-27 MED ORDER — ACETAMINOPHEN 650 MG RE SUPP: 650.0000 mg | Freq: Four times a day (QID) | RECTAL | Status: DC | PRN

## 2018-05-27 MED ORDER — CARVEDILOL 12.5 MG PO TABS
12.5000 mg | ORAL_TABLET | Freq: Two times a day (BID) | ORAL | Status: DC
  Administered 2018-05-28: 12.5 mg via ORAL
  Filled 2018-05-27: qty 1

## 2018-05-27 MED ORDER — IPRATROPIUM-ALBUTEROL 0.5-2.5 (3) MG/3ML IN SOLN
3.0000 mL | Freq: Four times a day (QID) | RESPIRATORY_TRACT | Status: DC
  Administered 2018-05-27 – 2018-05-28 (×3): 3 mL via RESPIRATORY_TRACT
  Filled 2018-05-27 (×3): qty 3

## 2018-05-27 MED ORDER — CARVEDILOL 12.5 MG PO TABS
37.5000 mg | ORAL_TABLET | Freq: Two times a day (BID) | ORAL | Status: DC
  Administered 2018-05-27: 37.5 mg via ORAL
  Filled 2018-05-27: qty 3

## 2018-05-27 MED ORDER — LEVETIRACETAM IN NACL 1000 MG/100ML IV SOLN
1000.0000 mg | Freq: Once | INTRAVENOUS | Status: AC
  Administered 2018-05-27: 1000 mg via INTRAVENOUS
  Filled 2018-05-27: qty 100

## 2018-05-27 MED ORDER — FUROSEMIDE 40 MG PO TABS
40.0000 mg | ORAL_TABLET | Freq: Every morning | ORAL | Status: DC
  Administered 2018-05-28: 40 mg via ORAL
  Filled 2018-05-27: qty 1

## 2018-05-27 MED ORDER — INSULIN ASPART 100 UNIT/ML ~~LOC~~ SOLN
0.0000 [IU] | Freq: Three times a day (TID) | SUBCUTANEOUS | Status: DC
  Administered 2018-05-28 (×2): 2 [IU] via SUBCUTANEOUS

## 2018-05-27 MED ORDER — NICOTINE 21 MG/24HR TD PT24
21.0000 mg | MEDICATED_PATCH | Freq: Every day | TRANSDERMAL | Status: DC
  Administered 2018-05-27 – 2018-05-28 (×2): 21 mg via TRANSDERMAL
  Filled 2018-05-27 (×2): qty 1

## 2018-05-27 NOTE — H&P (Signed)
History and Physical    Edwin White ZOX:[id number] DOB: 1966-03-11 DOA: 05/27/2018  PCP: Bernerd Limbo, MD   Patient coming from: [facility]  I have personally briefly reviewed patient's old medical records in [facility]  Chief Complaint: jerking of extremities, Twitching  HPI: Edwin White is a 52 y.o. male with medical history significant for CHF-systolic, diastolic, Apical thrombus, COPD, ischemic cardiomyopathy, anoxic brain injury, CVA,HTN, DM, nursing home resident who was brought to the ED with reports of jerking movements/twitching movements of his body started this morning.  History is obtained from chart review, EDP, and sister as patient is unable to give a history 2/2 CVA with significant slurred speech, anoxic brain injury. patient was sent out to [facility] rocking him yesterday to be evaluated for left-sided numbness tingling and pain, was discharged back from the ED to nursing home yesterday.  The patient's sister- Edwin White, reports patient had a stroke ~1 year ago, resulting in inability to walk or talk, patient was then sent to nursing home, and has baseline left-sided weakness.  The tremors are new.   ED Course: EDP notes jerking movements several times per minute. Tachypnea to 38 initial mild tachycardia to 107.  O2 sats greater than 92% on room air.  Hemoglobin 12.7 (baseline 14-15).  Glucose 192.  CT head-no definite acute intracranial abnormality, widespread ischemic screw squarely in the left hemisphere.  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 Recommended EEG, starting Keppra 1000 mg bolus then 500 every 12 hourly.   Review of Systems: As per HPI other systems reviewed and negative  Past Medical History:  Diagnosis Date  . CHF (congestive heart failure) (HCC)    No recurrent admissions for heart failure.  . Coronary artery disease    Status post coronary bypass  grafting in 2007  . Crescendo transient ischemic attacks   . Functional bowel disorder    Bloating and abdominal cramping as well as constipation requiring lubiprostone  . Gastroparesis    Gastric emptying study scheduled  . GERD (gastroesophageal reflux disease)   . Heart attack [facility]) September 13, 2004   Followed by bypass surgery  . History of medication noncompliance   . Hyperlipidemia   . Hypertension    Poorly controlled  . Ischemic cardiomyopathy    Ejection fraction improved from 15%-20% to 40%-45% in 2012 by echocardiogram  . Moderate mitral regurgitation by prior echocardiogram   . NSTEMI (non-ST elevated myocardial infarction) (HCC) 12/31/2013  . Post-infarction apical thrombus (HCC)    Anticoagulation discontinued  . Stroke [facility]) 04/2012   denies residual on 12/31/2013  . Type 2 diabetes mellitus (HCC)     Past Surgical History:  Procedure Laterality Date  . CORONARY ANGIOPLASTY WITH STENT PLACEMENT  2006  . CORONARY ARTERY BYPASS GRAFT  2007   "CABG X5"  . CORONARY/GRAFT ANGIOGRAPHY N/A 11/02/2016   Procedure: Coronary/Graft Angiography;  Surgeon: Yvonne Kendall, MD;  Location: [facility] INVASIVE CV LAB;  Service: Cardiovascular;  Laterality: N/A;  . LEFT HEART CATHETERIZATION WITH CORONARY/GRAFT ANGIOGRAM N/A 01/04/2014   Procedure: LEFT HEART CATHETERIZATION WITH Isabel Caprice;  Surgeon: Kathleene Hazel, MD;  Location: [facility] CATH LAB;  Service: Cardiovascular;  Laterality: N/A;  . PERCUTANEOUS CORONARY STENT INTERVENTION (PCI-S)  01/04/2014   Procedure: PERCUTANEOUS CORONARY STENT INTERVENTION (PCI-S);  Surgeon: Kathleene Hazel, MD;  Location: [facility] CATH LAB;  Service: Cardiovascular;;     reports that he has been smoking cigarettes. He  started smoking about 39 years ago. He has a 18.00 pack-year smoking history. He has never used smokeless tobacco. He reports that he does not drink alcohol or use drugs.  Allergies  Allergen Reactions  . Pravastatin Other (See  Comments)    PT CANNOT NOT WALK OR MOVE.    Family History  Adopted: Yes  Problem Relation Age of Onset  . Coronary artery disease Father   . Cancer Mother     Prior to Admission medications   Medication Sig Start Date End Date Taking? Authorizing Provider  amLODipine (NORVASC) 10 MG tablet Take 10 mg by mouth daily.    [provider]  aspirin 81 MG EC tablet TAKE 1 TABLET BY MOUTH EVERY DAY 05/29/16   Dettinger, Elige Radon, MD  atorvastatin (LIPITOR) 80 MG tablet Take 1 tablet (80 mg total) by mouth daily. 11/04/16   Robbie Lis M, PA-C  carvedilol (COREG) 12.5 MG tablet Take 1 tablet (12.5 mg total) by mouth 2 (two) times daily. 11/04/16   Robbie Lis M, PA-C  ergocalciferol (VITAMIN D2) 50000 units capsule Take 50,000 Units once a week by mouth.    [provider]  furosemide (LASIX) 20 MG tablet Take 20 mg 2 (two) times daily by mouth.    [provider]  Insulin Glargine (LANTUS SOLOSTAR) 100 UNIT/ML Solostar Pen Inject 20 Units into the skin daily at 10 pm. 08/24/16   Dettinger, Elige Radon, MD  insulin lispro (HUMALOG) 100 UNIT/ML injection Inject 15 Units into the skin.  12/06/16   [provider]  ipratropium-albuterol (DUONEB) 0.5-2.5 (3) MG/3ML SOLN 3 mLs. 12/06/16   [provider]  lisinopril (PRINIVIL,ZESTRIL) 20 MG tablet Take 20 mg by mouth daily.    [provider]  metFORMIN (GLUCOPHAGE) 500 MG tablet Take by mouth 2 (two) times daily with a meal.    [provider]  Multiple Vitamin (MULTIVITAMIN) capsule Take 1 capsule daily by mouth.    [provider]  nitroGLYCERIN (NITROSTAT) 0.4 MG SL tablet DISSOLVE 1 TABLET UNDER THE TONGUE EVERY 5 MINUTES AS NEEDED FOR CHEST PAIN. DO NOT EXCEED A TOTAL OF 3 DOSES IN 15 MINUTES. 11/06/17   Laqueta Linden, MD  senna (SENOKOT) 8.6 MG tablet Take 1 tablet 2 (two) times daily by mouth.    [provider]  spironolactone (ALDACTONE) 25 MG tablet Take  12.5 mg by mouth daily.    [provider]  XARELTO 20 MG TABS tablet TAKE 1 TABLET BY MOUTH EVERY DAY WITH SUPPER 11/06/17   Laqueta Linden, MD    Physical Exam:  Vitals:   05/27/18 1230 05/27/18 1400 05/27/18 1430 05/27/18 1530  BP: (!) 37/27 (!) 123/91 (!) 120/97 (!) 136/58  Pulse:    93  Resp: (!) 26 (!) 24 (!) 31 (!) 28  Temp:      TempSrc:      SpO2:    91%  Weight:      Height:        Constitutional: NAD, calm, comfortable Vitals:   05/27/18 1230 05/27/18 1400 05/27/18 1430 05/27/18 1530  BP: (!) 37/27 (!) 123/91 (!) 120/97 (!) 136/58  Pulse:    93  Resp: (!) 26 (!) 24 (!) 31 (!) 28  Temp:      TempSrc:      SpO2:    91%  Weight:      Height:       Eyes: PERRL, lids and conjunctivae normal ENMT: Mucous membranes are  moist. Posterior pharynx clear of any exudate or lesions. Neck: normal, supple, no masses, no thyromegaly Respiratory: clear to auscultation bilaterally, no wheezing, no crackles. Normal respiratory effort. No accessory muscle use.  Cardiovascular: Regular rate and rhythm, no murmurs / rubs / gallops. No extremity edema. 2+ pedal pulses.  Abdomen: no tenderness, no masses palpated. No hepatosplenomegaly. Bowel sounds positive.  Wearing adult diapers Musculoskeletal: no clubbing / cyanosis. No joint deformity upper and lower extremities. Good ROM, no contractures. Normal muscle tone.  Skin: no rashes, lesions, ulcers. No induration Neurologic: Significant speech impediment, slowed difficult to comprehend, moving all extremities strength right side 4 /5, left side- 5/5, following most not all directions for neuro exam.  At the time of my exam, I noticed jerking of his left upper extremity, 2ce, lasting a 3-5 seconds, once his left lower extremities would jeck also. Psychiatric: Awake and alert, full exam limited by patient speech.  Labs on Admission: I have personally reviewed following labs and imaging studies  CBC: Recent Labs  Lab  05/27/18 1132  WBC 9.6  NEUTROABS 6.9  HGB 12.7*  HCT 38.6*  MCV 93.5  PLT 141*   Basic Metabolic Panel: Recent Labs  Lab 05/27/18 1132  NA 139  K 4.6  CL 105  CO2 23  GLUCOSE 192*  BUN 25*  CREATININE 1.26*  CALCIUM 9.7   Liver Function Tests: Recent Labs  Lab 05/27/18 1132  AST 34  ALT 32  ALKPHOS 82  BILITOT 0.9  PROT 7.4  ALBUMIN 4.0    Radiological Exams on Admission: Ct Head Wo Contrast  Result Date: 05/27/2018 CLINICAL DATA:  52 year old male with uncontrollable body twitching which began this morning. Left side numbness tingling and pain yesterday. EXAM: CT HEAD WITHOUT CONTRAST TECHNIQUE: Contiguous axial images were obtained from the base of the skull through the vertex without intravenous contrast. COMPARISON:  [facility] head CT 0101 hours today. Brain MRI 02/06/2016. FINDINGS: Study is intermittently degraded by motion artifact despite repeated imaging attempts. Brain: Sequelae of extensive ischemic disease in the left hemisphere appears stable since the CT yesterday and similar to that on the 2017 MRI. No associated hemorrhage or mass effect, and there is mild ex vacuo enlargement of the left lateral ventricle. Chronic lacunar infarct in the right thalamus is stable since 2017 with otherwise preserved right hemisphere gray-white matter differentiation. 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 Vascular: Calcified atherosclerosis at the skull base. No suspicious intracranial vascular hyperdensity. Skull: No acute osseous abnormality identified. Sinuses/Orbits: Bilateral mastoid effusions and opacified left tympanic cavity redemonstrated and are progressed since 2017. Paranasal sinuses remain well pneumatized. Other: No acute orbit or scalp soft tissue finding. IMPRESSION: 1. No definite acute intracranial abnormality. 2. Stable CT appearance of the brain since 0101 hours today with widespread ischemic sequelae in the left hemisphere 3. Bilateral mastoid  effusions and opacified left tympanic cavity re-demonstrated and progressed since 2017. Electronically Signed   By: Odessa Fleming M.D.   On: 05/27/2018 13:51    EKG: Independently reviewed.  Sinus rhythm.  EKG unchanged from prior.  Assessment/Plan Active Problems:   Chronic systolic congestive heart failure (HCC)   COPD (chronic obstructive pulmonary disease) (HCC)   Coronary artery disease   Type 2 diabetes mellitus with hyperglycemia, with long-term current use of insulin (HCC)   Apical mural thrombus   Jerking movements of extremities   Jerking movements of body-involving trunk and extremities. History of left CVA and anoxic brain injury.  Head CT-no acute abnormality,  widespread ischemic sequelae in left hemisphere.  Neuro telemetry consulted in the ED-possible myoclonus.  Loaded with 1gram Keppra.  Frequency of Movements appears to have improved since starting Keppra.  -Continue IV Keppra started in ED 500mg  BID - Neurology consult in a.m. order placed - EEG - Check mag- 1.9, Phos- 3.5 - Will defer need for further imaging to neurology  Systolic and diastolic CHF, CAD with stents, apical thrombus-appears stable.  Echo 11/18/2016 EF 20%, diastolic function not assessed.  Follows with Dr. Purvis SheffieldKoneswaran.  Coronary angiography 2018-severe native coronary disease stable since 2015. -Continue Xarelto -Continue home Norvasc, lisinopril, spironolactone, Coreg, Lipitor -Continue Lasix 40 daily  HTN-stable -Continue home meds  DM-glucose 193. HgbA1c- 8.4.  - SSI -Continue home Lantus at reduced dose of 15 units daily -Hold meal coverage insulin for now  Left CVA-with residual slurred speech, right-sided deficit.  -Continue aspirin, Lipitor  Bilateral mastoid effusions, opacified left tympanic cavity- seen on CT, chronic, but progressed since 2017   DVT prophylaxis: Xarelto Code Status: DNR Family Communication: None at bedside Disposition Plan: per rounding team Consults called:  neurology Admission status: Obs, med-surg   Onnie BoerEjiroghene E Brigid Vandekamp MD [facility] Pager [phone] From 3PM - 11PM.  Otherwise please contact night-coverage [email or web address] Password [email or web address]  05/27/2018, 6:30 PM

## 2018-05-27 NOTE — ED Provider Notes (Signed)
[facility] EMERGENCY DEPARTMENT Provider Note   CSN: [id number] Arrival date & time: 05/27/18  1100  Level 5 caveat,, brain injured   History   Chief Complaint Chief Complaint  Patient presents with  . Tremors   Is obtained from Tammy, nurse at skilled nursing facility from records accompanying patient.   HPI Edwin White is a 52 y.o. male.  Patient complained of numbness in his left arm yesterday.  Today up at approximately 8:30 AM he developed tremors in his left arm.  No other complaint.  Patient denies pain anywhere denies numbness.  He was seen at [facility] yesterday and return to the skilled nursing facility.  HPI  Past Medical History:  Diagnosis Date  . CHF (congestive heart failure) (HCC)    No recurrent admissions for heart failure.  . Coronary artery disease    Status post coronary bypass grafting in 2007  . Crescendo transient ischemic attacks   . Functional bowel disorder    Bloating and abdominal cramping as well as constipation requiring lubiprostone  . Gastroparesis    Gastric emptying study scheduled  . GERD (gastroesophageal reflux disease)   . Heart attack [facility]) September 13, 2004   Followed by bypass surgery  . History of medication noncompliance   . Hyperlipidemia   . Hypertension    Poorly controlled  . Ischemic cardiomyopathy    Ejection fraction improved from 15%-20% to 40%-45% in 2012 by echocardiogram  . Moderate mitral regurgitation by prior echocardiogram   . NSTEMI (non-ST elevated myocardial infarction) (HCC) 12/31/2013  . Post-infarction apical thrombus (HCC)    Anticoagulation discontinued  . Stroke [facility]) 04/2012   denies residual on 12/31/2013  . Type 2 diabetes mellitus [facility])     Patient Active Problem List   Diagnosis Date Noted  . Hypertensive urgency   . Pulmonary edema 11/01/2016  . Acute on chronic combined systolic and diastolic heart failure (HCC)   . Apical mural thrombus   . Tobacco abuse   . Cerebrovascular accident (CVA) due  to thrombosis of cerebral artery (HCC) 02/05/2016  . 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 Type 2 diabetes mellitus with hyperglycemia, with long-term current use of insulin (HCC) 07/13/2015  . HLD (hyperlipidemia) 07/13/2015  . NSTEMI (non-ST elevated myocardial infarction) (HCC) 12/31/2013  . Hypokalemia 04/21/2012  . Thrombocytopenia (HCC) 04/20/2012  . Gastroparesis   . Ischemic cardiomyopathy   . Coronary artery disease   . Hypertension   . NONDEPENDENT TOBACCO USE DISORDER 05/24/2010  . 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 Chronic systolic congestive heart failure (HCC) 05/24/2010  . PVD WITH CLAUDICATION 05/24/2010  . COPD (chronic obstructive pulmonary disease) (HCC) 05/24/2010  . ESOPHAGEAL REFLUX 05/24/2010  . POSTSURGICAL AORTOCORONARY BYPASS STATUS 05/24/2010    Past Surgical History:  Procedure Laterality Date  . CORONARY ANGIOPLASTY WITH STENT PLACEMENT  2006  . CORONARY ARTERY BYPASS GRAFT  2007   "CABG X5"  . CORONARY/GRAFT ANGIOGRAPHY N/A 11/02/2016   Procedure: Coronary/Graft Angiography;  Surgeon: Yvonne Kendall, MD;  Location: [facility] INVASIVE CV LAB;  Service: Cardiovascular;  Laterality: N/A;  . LEFT HEART CATHETERIZATION WITH CORONARY/GRAFT ANGIOGRAM N/A 01/04/2014   Procedure: LEFT HEART CATHETERIZATION WITH Isabel Caprice;  Surgeon: Kathleene Hazel, MD;  Location: [facility] CATH LAB;  Service: Cardiovascular;  Laterality: N/A;  . PERCUTANEOUS CORONARY STENT INTERVENTION (PCI-S)  01/04/2014   Procedure: PERCUTANEOUS CORONARY STENT INTERVENTION (PCI-S);  Surgeon: Kathleene Hazel, MD;  Location: [facility] CATH LAB;  Service: Cardiovascular;;        Home Medications    Prior to Admission medications   Medication Sig Start Date End Date Taking? Authorizing Provider  amLODipine (NORVASC) 10 MG tablet Take 10 mg by mouth daily.    [provider]  aspirin 81 MG EC tablet TAKE 1 TABLET BY MOUTH  EVERY DAY 05/29/16   Dettinger, Elige Radon, MD  atorvastatin (LIPITOR) 80 MG tablet Take 1 tablet (80 mg total) by mouth daily. 11/04/16   Robbie Lis M, PA-C  carvedilol (COREG) 12.5 MG tablet Take 1 tablet (12.5 mg total) by mouth 2 (two) times daily. 11/04/16   Robbie Lis M, PA-C  ergocalciferol (VITAMIN D2) 50000 units capsule Take 50,000 Units once a week by mouth.    [provider]  furosemide (LASIX) 20 MG tablet Take 20 mg 2 (two) times daily by mouth.    [provider]  Insulin Glargine (LANTUS SOLOSTAR) 100 UNIT/ML Solostar Pen Inject 20 Units into the skin daily at 10 pm. 08/24/16   Dettinger, Elige Radon, MD  insulin lispro (HUMALOG) 100 UNIT/ML injection Inject 15 Units into the skin.  12/06/16   [provider]  ipratropium-albuterol (DUONEB) 0.5-2.5 (3) MG/3ML SOLN 3 mLs. 12/06/16   [provider]  lisinopril (PRINIVIL,ZESTRIL) 20 MG tablet Take 20 mg by mouth daily.    [provider]  metFORMIN (GLUCOPHAGE) 500 MG tablet Take by mouth 2 (two) times daily with a meal.    [provider]  Multiple Vitamin (MULTIVITAMIN) capsule Take 1 capsule daily by mouth.    [provider]  nitroGLYCERIN (NITROSTAT) 0.4 MG SL tablet DISSOLVE 1 TABLET UNDER THE TONGUE EVERY 5 MINUTES AS NEEDED FOR CHEST PAIN. DO NOT EXCEED A TOTAL OF 3 DOSES IN 15 MINUTES. 11/06/17   Laqueta Linden, MD  senna (SENOKOT) 8.6 MG tablet Take 1 tablet 2 (two) times daily by mouth.    [provider]  spironolactone (ALDACTONE) 25 MG tablet Take 12.5 mg by mouth daily.    [provider]  XARELTO 20 MG TABS tablet TAKE 1 TABLET BY MOUTH EVERY DAY WITH SUPPER 11/06/17   Laqueta Linden, MD    Family History Family History  Adopted: Yes  Problem Relation Age of Onset  . Coronary artery disease Father   . Cancer Mother     Social History Social History   Tobacco Use  . Smoking status: Current Every Day Smoker     Packs/day: 0.50    Years: 36.00    Pack years: 18.00    Types: Cigarettes    Start date: 05/05/1979  . Smokeless tobacco: Never Used  Substance Use Topics  . Alcohol use: No    Alcohol/week: 0.0 standard drinks    Comment: "quit drinking alcohol in 2006"  . Drug use: No   Resides in skilled nursing facility.  DNR CODE STATUS  Allergies   Pravastatin   Review of Systems Review of Systems  Unable to perform ROS: Dementia  Neurological:       Normally does not know month or year.  Speech not clear at baseline.  Bedbound.  Able to assist in transfers baseline.     Physical Exam Updated Vital Signs BP (!) 149/78 (BP Location: Right Arm)   Pulse 86   Temp 98.2 F (36.8 C) (Oral)   Resp 19   Ht 6' (1.829 m)   Wt 108 kg   SpO2 92%   BMI 32.29 kg/m  Physical Exam  Constitutional:  Chronically ill-appearing  HENT:  Head: Normocephalic and atraumatic.  noFacial asymmetry.  Edentulous  Eyes: Pupils are equal, round, and reactive to light. Conjunctivae are normal.  Neck: Neck supple. No tracheal deviation present. No thyromegaly present.  Cardiovascular: Normal rate and regular rhythm.  No murmur heard. Pulmonary/Chest: Effort normal and breath sounds normal.  Abdominal: Soft. Bowel sounds are normal. He exhibits no distension. There is no tenderness.  Musculoskeletal: Normal range of motion. He exhibits no edema or tenderness.  All 4 extremities without redness swelling or tenderness  Neurological: He is alert.  Oriented to name.  Follows simple commands moves all extremities.  Has twitching movements of left upper extremity.  No definite tonic-clonic movements.  Skin: Skin is warm and dry. No rash noted.  Psychiatric:  Obtainable, dementia  Nursing note and vitals reviewed.    ED Treatments / Results  Labs (all labs ordered are listed, but only abnormal results are displayed) Labs Reviewed  CBC WITH DIFFERENTIAL/PLATELET - Abnormal; Notable for the following  components:      Result Value   RBC 4.13 (*)    Hemoglobin 12.7 (*)    HCT 38.6 (*)    Platelets 141 (*)    All other components within normal limits  COMPREHENSIVE METABOLIC PANEL - Abnormal; Notable for the following components:   Glucose, Bld 192 (*)    BUN 25 (*)    Creatinine, Ser 1.26 (*)    All other components within normal limits   Results for orders placed or performed during the hospital encounter of 05/27/18  CBC with Differential  Result Value Ref Range   WBC 9.6 4.0 - 10.5 K/uL   RBC 4.13 (L) 4.22 - 5.81 MIL/uL   Hemoglobin 12.7 (L) 13.0 - 17.0 g/dL   HCT 69.638.6 (L) 29.539.0 - 28.452.0 %   MCV 93.5 80.0 - 100.0 fL   MCH 30.8 26.0 - 34.0 pg   MCHC 32.9 30.0 - 36.0 g/dL   RDW 13.213.2 44.011.5 - 10.215.5 %   Platelets 141 (L) 150 - 400 K/uL   nRBC 0.0 0.0 - 0.2 %   Neutrophils Relative % 73 %   Neutro Abs 6.9 1.7 - 7.7 K/uL   Lymphocytes Relative 19 %   Lymphs Abs 1.8 0.7 - 4.0 K/uL   Monocytes Relative 7 %   Monocytes Absolute 0.6 0.1 - 1.0 K/uL   Eosinophils Relative 1 %   Eosinophils Absolute 0.1 0.0 - 0.5 K/uL   Basophils Relative 0 %   Basophils Absolute 0.0 0.0 - 0.1 K/uL   Immature Granulocytes 0 %   Abs Immature Granulocytes 0.04 0.00 - 0.07 K/uL  Comprehensive metabolic panel  Result Value Ref Range   Sodium 139 135 - 145 mmol/L   Potassium 4.6 3.5 - 5.1 mmol/L   Chloride 105 98 - 111 mmol/L   CO2 23 22 - 32 mmol/L   Glucose, Bld 192 (H) 70 - 99 mg/dL   BUN 25 (H) 6 - 20 mg/dL   Creatinine, Ser 7.251.26 (H) 0.61 - 1.24 mg/dL   Calcium 9.7 8.9 - 36.610.3 mg/dL   Total Protein 7.4 6.5 - 8.1 g/dL   Albumin 4.0 3.5 - 5.0 g/dL   AST 34 15 - 41 U/L   ALT 32 0 - 44 U/L   Alkaline Phosphatase 82 38 - 126 U/L   Total Bilirubin 0.9 0.3 - 1.2 mg/dL   GFR calc non Af Amer >60 >60 mL/min   GFR  calc Af Amer >60 >60 mL/min   Anion gap 11 5 - 15  Magnesium  Result Value Ref Range   Magnesium 1.9 1.7 - 2.4 mg/dL   Ct Head Wo Contrast  Result Date: 05/27/2018 CLINICAL DATA:   52 year old male with uncontrollable body twitching which began this morning. Left side numbness tingling and pain yesterday. EXAM: CT HEAD WITHOUT CONTRAST TECHNIQUE: Contiguous axial images were obtained from the base of the skull through the vertex without intravenous contrast. COMPARISON:  [facility] head CT 0101 hours today. Brain MRI 02/06/2016. FINDINGS: Study is intermittently degraded by motion artifact despite repeated imaging attempts. Brain: Sequelae of extensive ischemic disease in the left hemisphere appears stable since the CT yesterday and similar to that on the 2017 MRI. No associated hemorrhage or mass effect, and there is mild ex vacuo enlargement of the left lateral ventricle. Chronic lacunar infarct in the right thalamus is stable since 2017 with otherwise preserved right hemisphere gray-white matter differentiation. 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 Vascular: Calcified atherosclerosis at the skull base. No suspicious intracranial vascular hyperdensity. Skull: No acute osseous abnormality identified. Sinuses/Orbits: Bilateral mastoid effusions and opacified left tympanic cavity redemonstrated and are progressed since 2017. Paranasal sinuses remain well pneumatized. Other: No acute orbit or scalp soft tissue finding. IMPRESSION: 1. No definite acute intracranial abnormality. 2. Stable CT appearance of the brain since 0101 hours today with widespread ischemic sequelae in the left hemisphere 3. Bilateral mastoid effusions and opacified left tympanic cavity re-demonstrated and progressed since 2017. Electronically Signed   By: Odessa Fleming M.D.   On: 05/27/2018 13:51  EKG EKG Interpretation  Date/Time:  Tuesday May 27 2018 12:23:42 EST Ventricular Rate:  91 PR Interval:    QRS Duration: 89 QT Interval:  387 QTC Calculation: 477 R Axis:   -30 Text Interpretation:  Sinus rhythm Left axis deviation Borderline prolonged QT interval Baseline wander in lead(s) V4 No  significant change since last tracing Confirmed by Doug Sou [phone]) on 05/27/2018 12:38:46 PM   Radiology No results found.  Procedures Procedures (including critical care time)  Medications Ordered in ED Medications - No data to display   Initial Impression / Assessment and Plan / ED Course  I have reviewed the triage vital signs and the nursing notes.  Pertinent labs & imaging results that were available during my care of the patient were reviewed by me and considered in my medical decision making (see chart for details).     I spoke with patient's sister and guardian Annamaria Boots Ragland telephone at 12:20 PM who confirms that patient is DNR CODE STATUS. 3:20 PM patient has increased myoclonic jerking movements now involving both legs, twitching of his face and left arm.  Tele-neurology consulted.  He feels that patient requires EEG, IV Keppra loading dose of 1000 mg, ordered by me and to be started on Keppra 500 mg every 12 hours as well as in-house neurology consult.   4:05 PM patient continued to have myoclonic jerking movements, less frequently since Keppra initiated.  He remains alert and talkative Dr.Emokpoe from hospitalist service consulted and arrange for overnight stay.  Lab work remarkable for mild renal insufficiency which is chronic, and hyperglycemia. Final Clinical Impressions(s) / ED Diagnoses  Dx#1 status myoclonus #2 chronic renal insufficiency #3 hyperglycemia Final diagnoses:  None   CRITICAL CARE Performed by: Doug Sou Total critical care time: 40 minutes Critical care time was exclusive of separately billable procedures and treating other patients. Critical care was necessary to  treat or prevent imminent or life-threatening deterioration. Critical care was time spent personally by me on the following activities: development of treatment plan with patient and/or surrogate as well as nursing, discussions with consultants, evaluation of patient's  response to treatment, examination of patient, obtaining history from patient or surrogate, ordering and performing treatments and interventions, ordering and review of laboratory studies, ordering and review of radiographic studies, pulse oximetry and re-evaluation of patient's condition. ED Discharge Orders    None       Doug Sou, MD 05/27/18 [phone]

## 2018-05-27 NOTE — ED Notes (Addendum)
Called pt's sister, Perrin SmackOpal Ragland, (listed as emergency contact) to get baseline info on pt. Opal stated that the patient had a stroke a little over a year ago and that he was not able to walk or talk after stroke, [facility] Opal also stated that pt has left sided weakness at baseline. Stated that tremors in arm is acute. Told Opal she could come to ED if she wanted to see pt.

## 2018-05-27 NOTE — ED Notes (Signed)
Attempted report to Harriett SineNancy, Charity fundraiserN. Harriett Sineancy unavailable for report at this time.

## 2018-05-27 NOTE — Consult Note (Signed)
   [facility] Tele[facility]urology Consult Services  Date of Service: 05/27/2018  Impression:  1. 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 History of stroke 3. History of anoxic brain injury of unknown source  Recommendations: I discussed with the emergency room physician obtaining an EEG when able and starting him on Keppra 1000 mg bolus and 5 mg every 12 hours. He should be followed by in-house neurology.  ---------------------------------------------------------------------  CC: jerking  History of Present Illness: this is a 52 year old man who is said to have had an anoxic brain injury, because in time unknown, and also he had a stroke. He has been unable to speak or walk for over a year and he is bedbound. He apparently can answer questions and single wars and has fair understanding. Suddenly in the last two days he is developed twitching said to be in the left arm. This was witnessed by the emergency department physician but it changed by the time I saw him. He is awake and is involuntary movements are not painful. He has a history of hypertension. He is not diabetic. He has coronary disease and has had a myocardial infarction. He has had a stroke. Records reflect he had an apical thrombus in the heart. He has had pulmonary emboli and he is on Xarelto.   Diagnostic Testing: CT brain revealed is old stroke in the left parietal region and underlying white matter ischemic change but nothing acute.  Exam:  Mental Status:  Awake, alert, but mute   Cranial Nerves: Extraocular movements: Intact in all cardinal gaze Ptosis: Absent Facial movements: Intact and symmetric    Motor Exam:  No drift upper extremities No drift lower extremities   Tremor/Abnormal Movements:  involuntary jerking movements of face, arms, legs and trunk suggesting myoclonus   Medical Decision Making:  - Extensive number of diagnosis or management options are considered above.   - Extensive amount of complex  data reviewed.   - High risk of complication and/or morbidity or mortality are associated with differential diagnostic considerations above.  - There may be uncertain outcome and increased probability of prolonged functional impairment or high probability of severe prolonged functional impairment associated with some of these differential diagnosis.   Medical Data Reviewed:  1.Data reviewed include clinical labs, radiology,  Medical Tests;   2.Tests results discussed w/performing or interpreting physician;   3.Obtaining/reviewing old medical records;  4.Obtaining case history from another source;  5.Independent review of image, tracing or specimen.    Patient was informed the Neurology Consult would happen via TeleHealth consult by way of interactive audio and video telecommunications and consented to receiving care in this manner.

## 2018-05-27 NOTE — ED Triage Notes (Signed)
[facility] due to uncontrolled body twitching that started this am. Nursing at SNF reports pt was sent out to [facility] yesterday to be evaluated for left sided numbness, tingling and pain. PT returned from the ED last night back to his room at the nursing home. PT denies any pain upon arrival and is alert and oriented.

## 2018-05-27 NOTE — Progress Notes (Signed)
No signs of seizures or tremors since admission this afternoon.  With history of stroke, speech is not clear but able to answer with short, garbled speech and able to make requests understood.

## 2018-05-28 ENCOUNTER — Observation Stay (HOSPITAL_COMMUNITY)
Admit: 2018-05-28 | Discharge: 2018-05-28 | Disposition: A | Discharge status: Home / Self Care | Payer: Medicaid Other | Attending: Internal Medicine | Admitting: Internal Medicine

## 2018-05-28 DIAGNOSIS — J439 Emphysema, unspecified: Secondary | ICD-10-CM | POA: Diagnosis not present

## 2018-05-28 DIAGNOSIS — I25708 Atherosclerosis of coronary artery bypass graft(s), unspecified, with other forms of angina pectoris: Secondary | ICD-10-CM | POA: Diagnosis not present

## 2018-05-28 DIAGNOSIS — E1165 Type 2 diabetes mellitus with hyperglycemia: Secondary | ICD-10-CM

## 2018-05-28 DIAGNOSIS — Z794 Long term (current) use of insulin: Secondary | ICD-10-CM

## 2018-05-28 DIAGNOSIS — I5022 Chronic systolic (congestive) heart failure: Secondary | ICD-10-CM | POA: Diagnosis not present

## 2018-05-28 DIAGNOSIS — R252 Cramp and spasm: Secondary | ICD-10-CM | POA: Diagnosis not present

## 2018-05-28 LAB — GLUCOSE, CAPILLARY
GLUCOSE-CAPILLARY: 166 mg/dL — AB (ref 70–99)
GLUCOSE-CAPILLARY: 198 mg/dL — AB (ref 70–99)

## 2018-05-28 LAB — HIV ANTIBODY (ROUTINE TESTING W REFLEX): HIV SCREEN 4TH GENERATION: NONREACTIVE

## 2018-05-28 MED ORDER — LEVETIRACETAM 500 MG PO TABS
500.0000 mg | ORAL_TABLET | Freq: Two times a day (BID) | ORAL | Status: DC
  Administered 2018-05-28: 500 mg via ORAL
  Filled 2018-05-28: qty 1

## 2018-05-28 MED ORDER — IPRATROPIUM-ALBUTEROL 0.5-2.5 (3) MG/3ML IN SOLN: 3.0000 mL | Freq: Two times a day (BID) | RESPIRATORY_TRACT | Status: DC

## 2018-05-28 MED ORDER — LEVETIRACETAM 500 MG PO TABS: 500.0000 mg | ORAL_TABLET | Freq: Two times a day (BID) | ORAL | Status: AC

## 2018-05-28 MED ORDER — RIVAROXABAN 20 MG PO TABS: 20.0000 mg | ORAL_TABLET | Freq: Every morning | ORAL | Status: AC

## 2018-05-28 MED ORDER — INSULIN GLARGINE 100 UNIT/ML SOLOSTAR PEN: 35.0000 [IU] | PEN_INJECTOR | Freq: Every day | SUBCUTANEOUS | Status: AC

## 2018-05-28 NOTE — Procedures (Signed)
History: 52 year old male being evaluated for myoclonus  Sedation: None  Technique: This is a 21 channel routine scalp EEG performed at the bedside with bipolar and monopolar montages arranged in accordance to the international 10/20 system of electrode placement. One channel was dedicated to EKG recording.    Background: The background consists of intermixed alpha and beta activities. There is a well defined posterior dominant rhythm of 9 Hz that attenuates with eye opening. Sleep is not recorded.  There is a single sharp wave discharge that is seen at T7, T7 >F7.  Photic stimulation: Physiologic driving is not performed  EEG Abnormalities: 1) left temporal sharp wave discharge  Clinical Interpretation: This EEG is suggestive of a focal area of epileptogenicity in the left temporal region. There was no seizure recorded on this study.    Edwin SlotMcNeill Keawe Marcello, MD [facility] [phone]  If 7pm- 7am, please page neurology on call as listed in AMION.

## 2018-05-28 NOTE — Discharge Summary (Signed)
Physician Discharge Summary  Edwin White GNF:[id number] DOB: Apr 03, 1966 DOA: 05/27/2018  PCP: Bernerd Limbo, MD  Admit date: 05/27/2018 Discharge date: 05/28/2018  Admitted From: SNF  Disposition: SNF  Recommendations for Outpatient Follow-up:  1. Follow up with PCP in 1 weeks 2. Please follow up with outpatient neurologist in 2 weeks for recheck.  3. Please observe seizure precautions 4. Please follow up results of EEG with neurologist.   5. Please continue new medication: keppra 500 mg twice daily.   Discharge Condition: STABLE   CODE STATUS: DNR    Brief Hospitalization Summary: Please see all hospital notes, images, labs for full details of the hospitalization. HPI: Edwin White is a 52 y.o. male with medical history significant for CHF-systolic, diastolic, Apical thrombus, COPD, ischemic cardiomyopathy, anoxic brain injury, CVA,HTN, DM, nursing home resident who was brought to the ED with reports of jerking movements/twitching movements of his body started this morning.  History is obtained from chart review, EDP, and sister as patient is unable to give a history 2/2 CVA with significant slurred speech, anoxic brain injury. patient was sent out to [facility] rocking him yesterday to be evaluated for left-sided numbness tingling and pain, was discharged back from the ED to nursing home yesterday.  The patient's sister- Annamaria Boots, reports patient had a stroke ~1 year ago, resulting in inability to walk or talk, patient was then sent to nursing home, and has baseline left-sided weakness.  The tremors are new.   ED Course: EDP notes jerking movements several times per minute. Tachypnea to 38 initial mild tachycardia to 107.  O2 sats greater than 92% on room air.  Hemoglobin 12.7 (baseline 14-15).  Glucose 192.  CT head-no definite acute intracranial abnormality, widespread ischemic screw squarely in the left hemisphere.  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 Recommended EEG, starting Keppra 1000 mg bolus then 500 every 12 hourly.   Hospital Course:  The patient was admitted for observation and was started on keppra 1000 mg bolus followed by 500 mg every 12 hours. The patient was started on this and he did well and his symptoms completely resolved.  He was going to be seen by inpatient neurologist but there was no inpatient neurology coverage at hospital this week. 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  He received an EEG but results are pending at discharge.  Pt was advised to follow up with outpatient neurology in 2 weeks for recheck.  He will continue keppra 500 mg BID.  He is stable to discharge back to SNF today.   His other medical problems remained stable.    Discharge Diagnoses:  Principal Problem:   Jerking movements of extremities Active Problems:   Chronic systolic congestive heart failure (HCC)   COPD (chronic obstructive pulmonary disease) (HCC)   Coronary artery disease   Type 2 diabetes mellitus with hyperglycemia, with long-term current use of insulin (HCC)   Apical mural thrombus  Discharge Instructions:  Allergies as of 05/28/2018      Reactions   Pravastatin Other (See Comments)   PT CANNOT NOT WALK OR MOVE.      Medication List    TAKE these medications   amLODipine 10 MG tablet Commonly known as:  NORVASC Take 10 mg by mouth daily.   aspirin 81 MG EC tablet TAKE 1 TABLET BY MOUTH  EVERY DAY   atorvastatin 80 MG tablet Commonly known as:  LIPITOR Take 1 tablet (80 mg total) by mouth daily.   carvedilol 12.5 MG tablet Commonly known as:  COREG Take 1 tablet (12.5 mg total) by mouth 2 (two) times daily. What changed:  how much to take   ergocalciferol 1.25 MG (50000 UT) capsule Commonly known as:  VITAMIN D2 Take 50,000 Units by  mouth every Saturday.   furosemide 20 MG tablet Commonly known as:  LASIX Take 40 mg by mouth every morning.   Insulin Glargine 100 UNIT/ML Solostar Pen Commonly known as:  LANTUS Inject 35 Units into the skin at bedtime.   insulin lispro 100 UNIT/ML injection Commonly known as:  HUMALOG Inject 15 Units into the skin every 6 (six) hours. Hold if blood sugar levels are <150   ipratropium-albuterol 0.5-2.5 (3) MG/3ML Soln Commonly known as:  DUONEB Inhale 3 mLs into the lungs every 6 (six) hours.   levETIRAcetam 500 MG tablet Commonly known as:  KEPPRA Take 1 tablet (500 mg total) by mouth 2 (two) times daily.   lisinopril 20 MG tablet Commonly known as:  PRINIVIL,ZESTRIL Take 20 mg by mouth daily.   metFORMIN 500 MG tablet Commonly known as:  GLUCOPHAGE Take 500 mg by mouth 2 (two) times daily with a meal.   multivitamin capsule Take 1 capsule daily by mouth.   nitroGLYCERIN 0.4 MG SL tablet Commonly known as:  NITROSTAT DISSOLVE 1 TABLET UNDER THE TONGUE EVERY 5 MINUTES AS NEEDED FOR CHEST PAIN. DO NOT EXCEED A TOTAL OF 3 DOSES IN 15 MINUTES.   OXYGEN Inhale 2 L into the lungs daily.   rivaroxaban 20 MG Tabs tablet Commonly known as:  XARELTO Take 1 tablet (20 mg total) by mouth every morning. What changed:  See the new instructions.   senna 8.6 MG tablet Commonly known as:  SENOKOT Take 1 tablet 2 (two) times daily by mouth.   spironolactone 25 MG tablet Commonly known as:  ALDACTONE Take 12.5 mg by mouth daily.      Follow-up Information    Bernerd Limbo, MD. Schedule an appointment as soon as possible for a visit in 1 week(s).   Specialty:  Internal Medicine Why:  Hospital Follow Up  Contact information: [address] [place] [address] [phone]        Beryle Beams, MD. Schedule an appointment as soon as possible for a visit in 2 week(s).   Specialty:  Neurology Why:  Hospital Follow Up  Contact information: [address] [place] [address] [phone]          Allergies  Allergen Reactions  . Pravastatin Other (See Comments)    PT CANNOT NOT WALK OR MOVE.   Allergies as of 05/28/2018      Reactions   Pravastatin Other (See Comments)   PT CANNOT NOT WALK OR MOVE.      Medication List    TAKE these medications   amLODipine 10 MG tablet Commonly known as:  NORVASC Take 10 mg by mouth daily.   aspirin 81 MG EC tablet TAKE 1 TABLET BY MOUTH EVERY DAY   atorvastatin 80 MG tablet Commonly known as:  LIPITOR Take 1 tablet (80 mg total) by mouth daily.   carvedilol 12.5 MG tablet Commonly known as:  COREG Take 1 tablet (12.5 mg total) by mouth 2 (two) times daily. What changed:  how much to take   ergocalciferol 1.25 MG (50000 UT) capsule  Commonly known as:  VITAMIN D2 Take 50,000 Units by mouth every Saturday.   furosemide 20 MG tablet Commonly known as:  LASIX Take 40 mg by mouth every morning.   Insulin Glargine 100 UNIT/ML Solostar Pen Commonly known as:  LANTUS Inject 35 Units into the skin at bedtime.   insulin lispro 100 UNIT/ML injection Commonly known as:  HUMALOG Inject 15 Units into the skin every 6 (six) hours. Hold if blood sugar levels are <150   ipratropium-albuterol 0.5-2.5 (3) MG/3ML Soln Commonly known as:  DUONEB Inhale 3 mLs into the lungs every 6 (six) hours.   levETIRAcetam 500 MG tablet Commonly known as:  KEPPRA Take 1 tablet (500 mg total) by mouth 2 (two) times daily.   lisinopril 20 MG tablet Commonly known as:  PRINIVIL,ZESTRIL Take 20 mg by mouth daily.   metFORMIN 500 MG tablet Commonly known as:  GLUCOPHAGE Take 500 mg by mouth 2 (two) times daily with a meal.   multivitamin capsule Take 1 capsule daily by mouth.   nitroGLYCERIN 0.4 MG SL tablet Commonly known as:  NITROSTAT DISSOLVE 1 TABLET UNDER THE TONGUE EVERY 5 MINUTES AS NEEDED FOR CHEST PAIN. DO NOT EXCEED A TOTAL OF 3 DOSES IN 15 MINUTES.   OXYGEN Inhale 2 L  into the lungs daily.   rivaroxaban 20 MG Tabs tablet Commonly known as:  XARELTO Take 1 tablet (20 mg total) by mouth every morning. What changed:  See the new instructions.   senna 8.6 MG tablet Commonly known as:  SENOKOT Take 1 tablet 2 (two) times daily by mouth.   spironolactone 25 MG tablet Commonly known as:  ALDACTONE Take 12.5 mg by mouth daily.      Procedures/Studies: Ct Head Wo Contrast  Result Date: 05/27/2018 CLINICAL DATA:  52 year old male with uncontrollable body twitching which began this morning. Left side numbness tingling and pain yesterday. EXAM: CT HEAD WITHOUT CONTRAST TECHNIQUE: Contiguous axial images were obtained from the base of the skull through the vertex without intravenous contrast. COMPARISON:  [facility] 0101 hours today. Brain MRI 02/06/2016. FINDINGS: Study is intermittently degraded by motion artifact despite repeated imaging attempts. Brain: Sequelae of extensive ischemic disease in the left hemisphere appears stable since the CT yesterday and similar to that on the 2017 MRI. No associated hemorrhage or mass effect, and there is mild ex vacuo enlargement of the left lateral ventricle. Chronic lacunar infarct in the right thalamus is stable since 2017 with otherwise preserved right hemisphere gray-white matter differentiation. 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 Vascular: Calcified atherosclerosis at the skull base. No suspicious intracranial vascular hyperdensity. Skull: No acute osseous abnormality identified. Sinuses/Orbits: Bilateral mastoid effusions and opacified left tympanic cavity redemonstrated and are progressed since 2017. Paranasal sinuses remain well pneumatized. Other: No acute orbit or scalp soft tissue finding. IMPRESSION: 1. No definite acute intracranial abnormality. 2. Stable CT appearance of the brain since 0101 hours today with widespread ischemic sequelae in the left hemisphere 3. Bilateral mastoid effusions  and opacified left tympanic cavity re-demonstrated and progressed since 2017. Electronically Signed   By: Odessa FlemingH  Hall M.D.   On: 05/27/2018 13:51      Subjective: Pt is without complaints and reports that his symptoms have resolved.  He is having no further twitching movements.  He seems to be tolerating the keppra very well.  He is eating and drinking well with no problems.  He is taking his medications orally with no problems.  He has no complaints  today.    Discharge Exam: Vitals:   05/28/18 0528 05/28/18 0832  BP: 133/80   Pulse: 80   Resp:    Temp: 98.1 F (36.7 C)   SpO2: 91% 90%   Vitals:   05/27/18 2151 05/28/18 0221 05/28/18 0528 05/28/18 0832  BP: 117/71  133/80   Pulse: 84  80   Resp:      Temp: 99 F (37.2 C)  98.1 F (36.7 C)   TempSrc: Oral  Oral   SpO2: 92% 91% 91% 90%  Weight:      Height:       General: Pt is alert, awake, not in acute distress Cardiovascular: RRR, S1/S2 +, no rubs, no gallops Respiratory: CTA bilaterally, no wheezing, no rhonchi Abdominal: Soft, NT, ND, bowel sounds + Extremities: no edema, no cyanosis Neurological: moving all extremities. No myoclonic jerking motions were seen.     The results of significant diagnostics from this hospitalization (including imaging, microbiology, ancillary and laboratory) are listed below for reference.     Microbiology: Recent Results (from the past 240 hour(s))  MRSA PCR Screening     Status: None   Collection Time: 05/27/18  6:55 PM  Result Value Ref Range Status   MRSA by PCR NEGATIVE NEGATIVE Final    Comment:        The GeneXpert MRSA Assay (FDA approved for NASAL specimens only), is one component of a comprehensive MRSA colonization surveillance program. It is not intended to diagnose MRSA infection nor to guide or monitor treatment for MRSA infections. Performed at [facility], [address], [place], [place] [address]      Labs: BNP (last 3 results) No results for input(s): BNP in  the last 8760 hours. Basic Metabolic Panel: Recent Labs  Lab 05/27/18 1132 05/27/18 1530  NA 139  --   K 4.6  --   CL 105  --   CO2 23  --   GLUCOSE 192*  --   BUN 25*  --   CREATININE 1.26*  --   CALCIUM 9.7  --   MG 1.9  --   PHOS  --  3.5   Liver Function Tests: Recent Labs  Lab 05/27/18 1132  AST 34  ALT 32  ALKPHOS 82  BILITOT 0.9  PROT 7.4  ALBUMIN 4.0   No results for input(s): LIPASE, AMYLASE in the last 168 hours. No results for input(s): AMMONIA in the last 168 hours. CBC: Recent Labs  Lab 05/27/18 1132  WBC 9.6  NEUTROABS 6.9  HGB 12.7*  HCT 38.6*  MCV 93.5  PLT 141*   Cardiac Enzymes: No results for input(s): CKTOTAL, CKMB, CKMBINDEX, TROPONINI in the last 168 hours. BNP: Invalid input(s): POCBNP CBG: Recent Labs  Lab 05/27/18 2229 05/28/18 0738 05/28/18 1151  GLUCAP 170* 166* 198*   D-Dimer No results for input(s): DDIMER in the last 72 hours. Hgb A1c No results for input(s): HGBA1C in the last 72 hours. Lipid Profile No results for input(s): CHOL, HDL, LDLCALC, TRIG, CHOLHDL, LDLDIRECT in the last 72 hours. Thyroid function studies No results for input(s): TSH, T4TOTAL, T3FREE, THYROIDAB in the last 72 hours.  Invalid input(s): FREET3 Anemia work up No results for input(s): VITAMINB12, FOLATE, FERRITIN, TIBC, IRON, RETICCTPCT in the last 72 hours. Urinalysis    Component Value Date/Time   COLORURINE YELLOW 02/06/2016 0655   APPEARANCEUR CLEAR 02/06/2016 0655   LABSPEC 1.020 02/06/2016 0655   PHURINE 6.0 02/06/2016 0655   GLUCOSEU 250 (A) 02/06/2016  0655   HGBUR NEGATIVE 02/06/2016 0655   BILIRUBINUR NEGATIVE 02/06/2016 0655   KETONESUR NEGATIVE 02/06/2016 0655   PROTEINUR 30 (A) 02/06/2016 0655   NITRITE NEGATIVE 02/06/2016 0655   LEUKOCYTESUR NEGATIVE 02/06/2016 0655   Sepsis Labs Invalid input(s): PROCALCITONIN,  WBC,  LACTICIDVEN Microbiology Recent Results (from the past 240 hour(s))  MRSA PCR Screening     Status:  None   Collection Time: 05/27/18  6:55 PM  Result Value Ref Range Status   MRSA by PCR NEGATIVE NEGATIVE Final    Comment:        The GeneXpert MRSA Assay (FDA approved for NASAL specimens only), is one component of a comprehensive MRSA colonization surveillance program. It is not intended to diagnose MRSA infection nor to guide or monitor treatment for MRSA infections. Performed at [facility], [address] [place], [place] [address]    Time coordinating discharge:   SIGNED:  Standley Dakins, MD  [facility] 05/28/2018, 12:57 PM Pager [phone]  If 7PM-7AM, please contact night-coverage [email or web address] Password [email or web address]

## 2018-05-28 NOTE — Discharge Instructions (Signed)
Please make appt with neurologist in 2 weeks for recheck.  Seek medical care or return to ER if symptoms come back, worsen or new problem develops.    Myoclonus Myoclonus is a term that refers to brief, involuntary twitching or jerking of a muscle or a group of muscles. It describes a symptom, and generally, is not a diagnosis of a disease. The myoclonic twitches or jerks are usually caused by sudden muscle contractions. They also can result from brief lapses of contraction. Myoclonic twitches or jerks may occur:  Alone or in sequence.  In a pattern or without pattern.  Infrequently or many times each minute.  Often times, myoclonus is one of several symptoms in a wide variety of nervous system disorders such as:  Multiple sclerosis.  Parkinson's disease.  Alzheimer's disease.  Creutzfeldt-Jakob disease.  Familiar examples of normal myoclonus include:  Hiccups and jerks.  "Sleep starts" that some people have while drifting off to sleep.  Severe cases can severely limit a person's ability to:  Eat.  Talk.  Walk.  Myoclonic jerks commonly occur in individuals with epilepsy. The most common types of myoclonus include:  Action.  Cortical reflex.  Essential.  Palatal.  Progressive myoclonus epilepsy.  Reticular reflex.  Sleep.  Stimulus-sensitive.  Treatment Treatment for myoclonus consists of medicines that may help reduce symptoms. These drugs (many of which are also used to treat epilepsy) include:  Barbiturates.  Clonazepam.  Phenytoin.  Primidone.  Sodium valproate.  The complex origins of myoclonus may require the use of multiple drugs.  This information is not intended to replace advice given to you by your health care provider. Make sure you discuss any questions you have with your health care provider. This information is not intended to replace advice given to you by your health care provider. Make sure you discuss any questions you have with  your health care provider. Document Released: 06/08/2002 Document Revised: 06/01/2016 Document Reviewed: 05/21/2013 Elsevier Interactive Patient Education  2018 [employer]   Non-Epileptic Seizures, Adult A seizure can cause:  Involuntary movements, like falling or shaking.  Changes in awareness or consciousness.  Convulsions. These are episodes of uncontrollable, jerking movement caused by sudden, intense tightening (contraction) of the muscles.  Epileptic seizures are caused by abnormal electrical activity in the brain. Non-epileptic seizures are different. They are not caused by abnormal electrical signals in your brain. These seizures may look like epileptic seizures, but they are not caused by epilepsy. There are two types of non-epileptic seizures:  Physiologic non-epileptic seizure. This type results from an underlying problem that causes a disruption in your brains electrical activity.  Psychogenic non-epileptic seizure. This type results from emotional stress. These seizures are sometimes called pseudoseizures.  What are the causes? Causes of physiologic non-epileptic seizures can include:  Sudden drop in blood pressure.  Low blood sugar (glucose).  Low levels of salt (sodium) in your blood.  Low levels of calcium in your blood.  Migraine.  Heart rhythm problems.  Sleep disorders, such as narcolepsy.  Movement disorders, such as Tourette syndrome.  Infection.  Certain medicines.  Drug and alcohol abuse.  Fever.  Common causes of psychogenic non-epileptic seizures can include:  Stress.  Emotional trauma.  Sexual or physical abuse.  Major life events, such as divorce or death of a loved one.  Mental health disorders, including anxiety and depression.  What are the signs or symptoms? Symptoms of a non-epileptic seizure can be similar to those of an epileptic seizure, which may  include:  A change in attention or behavior (altered mental  status).  Loss of consciousness or fainting.  Convulsions with rhythmic jerking movements.  Drooling.  Rapid eye movements.  Grunting.  Loss of bladder control and bowel control.  Bitter taste in the mouth.  Tongue biting.  Some people experience unusual sensations (aura) before having a seizure. These can include:  "Butterflies" in the stomach.  Abnormal smells or tastes.  A feeling of having had a new experience before (dj vu).  After a non-epileptic seizure, you may have a headache or sore muscles or feel confused and sleepy. Non-epileptic seizures usually:  Do not cause physical injuries.  Start slowly.  Include crying or shrieking.  Last longer than 2 minutes.  Include pelvic thrusting.  How is this diagnosed? Non-epileptic seizures may be diagnosed by:  Your medical history.  A physical exam.  Your symptoms. ? Your health care provider may want to talk with your friends or relatives who have seen you have a seizure. ? If possible, it is helpful if you write down your seizure activity, including what led up to the seizure, and share that information with your health care provider.  You may also need to have tests to look for causes of physiologic non-epileptic seizures. These may include:  An electroencephalogram (EEG). This test measures electrical activity in your brain. If you have had a non-epileptic seizure, the results of your EEG will likely be normal.  Video EEG. This test takes place in the hospital over the course of 2-7 days. The test uses a video camera and an EEG to monitor your symptoms and the electrical activity in your brain.  Blood tests.  Lumbar puncture. This test involves pulling fluid from your spine to check for infection.  Electrocardiogram (ECG or EKG). This test checks for an abnormal heart rhythm.  CT scan.  If your health care provider thinks you have had a psychogenic non-epileptic seizure, you may need to see a mental  health specialist for an evaluation. How is this treated? The treatment for your seizures will depend on what is causing them. When the underlying condition is treated, your seizures should stop. If your seizures are being caused by emotional trauma or stress, your health care provider may recommend that you see a mental health professional. Treatment may include:  Relaxation therapy or cognitive behavioral therapy.  Medicines to treat depression or anxiety.  Individual or family counseling.  In some cases, you may have psychogenic seizures in addition to epileptic seizures. If this is the case, you may be prescribed medicine to help with the epileptic seizures. Follow these instructions at home: Home care will depend on the type of non-epileptic seizures that you have. In general:  Follow all instructions from your health care provider. These may include ways to prevent seizures and what to do if you have a seizure.  Take over-the-counter and prescription medicines only as told by your health care provider.  Keep all follow-up visits as told by your health care provider. This is important.  Make sure family members, friends, and coworkers are trained on how to help you if you have a seizure. If you have a seizure, they should: ? Lay you on the ground to prevent a fall. ? Place a pillow or piece of clothing under your head. ? Loosen any clothing around your neck. ? Turn you onto your side. If vomiting occurs, this helps keep your airway clear.  Avoid any substances that may prevent your  medicine from working properly. If you are prescribed medicine for seizures: ? Do not use recreational drugs. ? Limit or avoid alcoholic beverages.  Contact a health care provider if:  Your seizures change or become more frequent.  You continue to have seizures after treatment. Get help right away if:  You injure yourself during a seizure.  You have one seizure after another.  You have  trouble recovering from a seizure.  You have chest pain or trouble breathing.  You have a seizure that lasts longer than 5 minutes. Summary  Non-epileptic seizures may look like epileptic seizures, but they are not caused by epilepsy.  The treatment for your seizures will depend on what is causing them. When the underlying condition is treated, your seizures should stop.  Make sure family members, friends, and coworkers are trained on how to help you if you have a seizure. If you have a seizure, they should lay you on the ground to prevent a fall, protect your head and neck, and turn you onto your side. This information is not intended to replace advice given to you by your health care provider. Make sure you discuss any questions you have with your health care provider. Document Released: 08/03/2005 Document Revised: 03/30/2016 Document Reviewed: 03/30/2016 Elsevier Interactive Patient Education  2018 [employer].  Myoclonus Myoclonus is a term that refers to brief, involuntary twitching or jerking of a muscle or a group of muscles. It describes a symptom, and generally, is not a diagnosis of a disease. The myoclonic twitches or jerks are usually caused by sudden muscle contractions. They also can result from brief lapses of contraction. Myoclonic twitches or jerks may occur:  Alone or in sequence.  In a pattern or without pattern.  Infrequently or many times each minute.  Often times, myoclonus is one of several symptoms in a wide variety of nervous system disorders such as:  Multiple sclerosis.  Parkinson's disease.  Alzheimer's disease.  Creutzfeldt-Jakob disease.  Familiar examples of normal myoclonus include:  Hiccups and jerks.  "Sleep starts" that some people have while drifting off to sleep.  Severe cases can severely limit a person's ability to:  Eat.  Talk.  Walk.  Myoclonic jerks commonly occur in individuals with epilepsy. The most common types of  myoclonus include:  Action.  Cortical reflex.  Essential.  Palatal.  Progressive myoclonus epilepsy.  Reticular reflex.  Sleep.  Stimulus-sensitive.  Treatment Treatment for myoclonus consists of medicines that may help reduce symptoms. These drugs (many of which are also used to treat epilepsy) include:  Barbiturates.  Clonazepam.  Phenytoin.  Primidone.  Sodium valproate.  The complex origins of myoclonus may require the use of multiple drugs.  This information is not intended to replace advice given to you by your health care provider. Make sure you discuss any questions you have with your health care provider. This information is not intended to replace advice given to you by your health care provider. Make sure you discuss any questions you have with your health care provider. Document Released: 06/08/2002 Document Revised: 06/01/2016 Document Reviewed: 05/21/2013 Elsevier Interactive Patient Education  [employer].

## 2018-05-28 NOTE — Progress Notes (Signed)
Patient discharged home today per MD orders. Patient vital signs WDL. IV removed and site WDL. Discharge Instructions including follow up appointments, medications, and education [facility]. Patient legal guardian notified. Patient is transported out via EMS.

## 2018-05-28 NOTE — Clinical Social Work Note (Signed)
Clinical Social Work Assessment  Patient Details  Name: Edwin White MRN: [id number] Date of Birth: 28-Dec-1965  Date of referral:  05/28/18               Reason for consult:  Facility Placement                Permission sought to share information with:    Permission granted to share information::     Name::        Agency::  [facility]  Relationship::     Contact Information:  sister, Edwin White.  Housing/Transportation Living arrangements for the past 2 months:  Skilled Nursing Facility Source of Information:  Adult Children, Facility Patient Interpreter Needed:  None Criminal Activity/Legal Involvement Pertinent to Current Situation/Hospitalization:  No - Comment as needed Significant Relationships:  Siblings Lives with:  Facility Resident Do you feel safe going back to the place where you live?  Yes Need for family participation in patient care:  Yes (Comment)  Care giving concerns:  None identified.   Social Worker assessment / plan:  [facility]  [facility] of [facility] and discharge summary sent. No FL2 sent due to patient being in observation for less than 24 hours.  Patient's sister, Edwin White, notified of discharge.  RN to call EMS to transport.  LCSW signing off.   Employment status:  Disabled (Comment on whether or not currently receiving Disability) Insurance information:  [place] Recommendations:  Not assessed at this time Information / Referral to community resources:     Patient/Family's Response to care:  Family agreeable to return to facility.   Patient/Family's Understanding of and Emotional Response to Diagnosis, Current Treatment, and Prognosis:  Family understands patient's diagnosis, treatment and prognosis and feel LTC is most appropriate plan to continue.   Emotional Assessment Appearance:  Appears stated age Attitude/Demeanor/Rapport:    Affect (typically observed):   Accepting Orientation:  Oriented to Self, Oriented to Place Alcohol / Substance use:  Not Applicable Psych involvement (Current and /or in the community):  No (Comment)  Discharge Needs  Concerns to be addressed:  Other (Comment Required(return to facility ) Readmission within the last 30 days:  No Current discharge risk:  None Barriers to Discharge:  No Barriers Identified   Annice NeedySettle, Jovanni Rash D, LCSW 05/28/2018, 4:05 PM

## 2018-05-28 NOTE — Progress Notes (Signed)
EEG completed; results pending.    

## 2019-04-02 DEATH — deceased

## 2019-07-10 ENCOUNTER — Telehealth: Payer: Medicaid Other | Admitting: Cardiovascular Disease

## 2020-10-01 IMAGING — CT CT HEAD W/O CM
3 of 6 series · 13 of 47 positions shown, 15 images · non-contrast
Comparison: [HOSPITAL] [facility] CT 5757 hours today. Brain
MRI 02/06/2016.

CLINICAL DATA: 52-year-old male with uncontrollable body twitching
which began this morning. Left side numbness tingling and pain
yesterday.

EXAM:
CT HEAD WITHOUT CONTRAST
TECHNIQUE: Contiguous axial images were obtained from the base of the skull
through the vertex without intravenous contrast.

[Series 2: head trauma wo · axial · 0.45mm/px · z∈[-19,+111]mm · 8 of 34 slices shown, 10 images]
[im 4/34  brain]
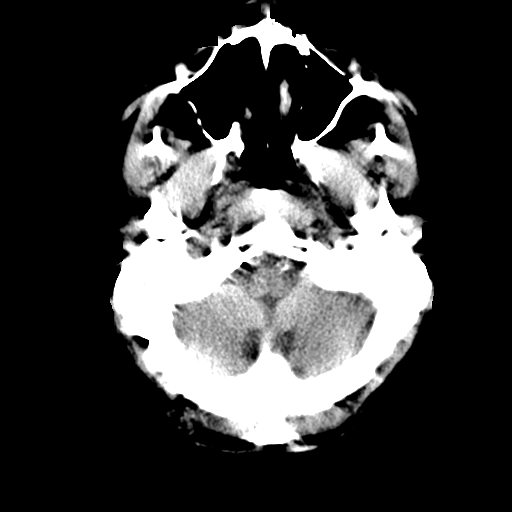
[im 4/34  bone]
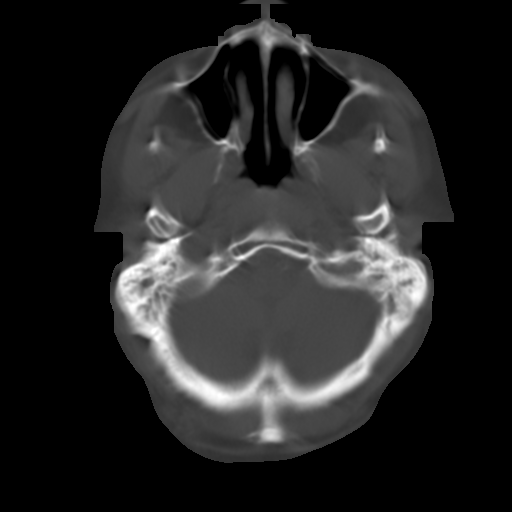
[im 8/34  brain]
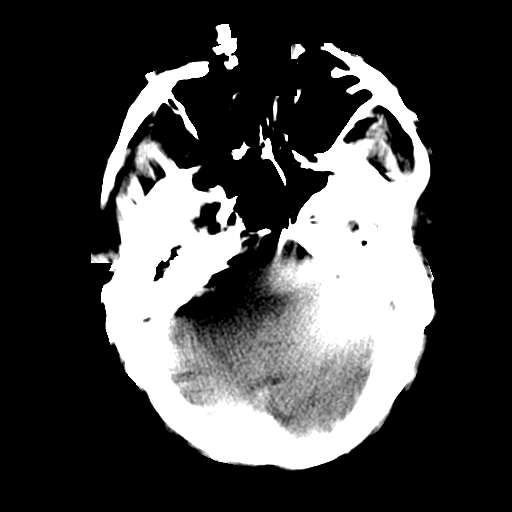
[im 12/34  brain]
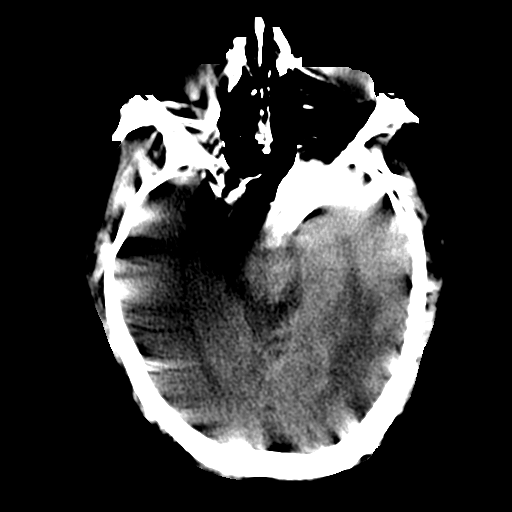
[im 15/34  brain]
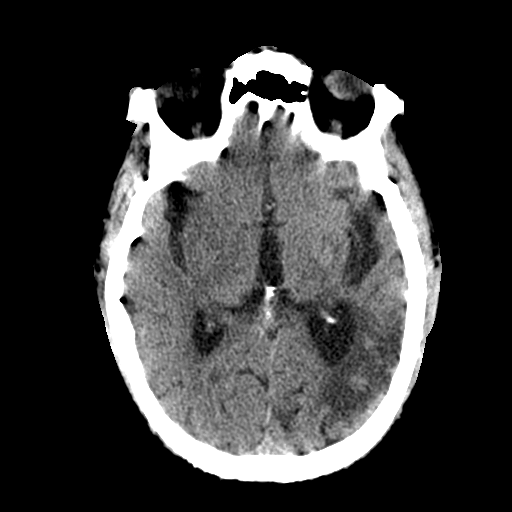
[im 19/34  brain]
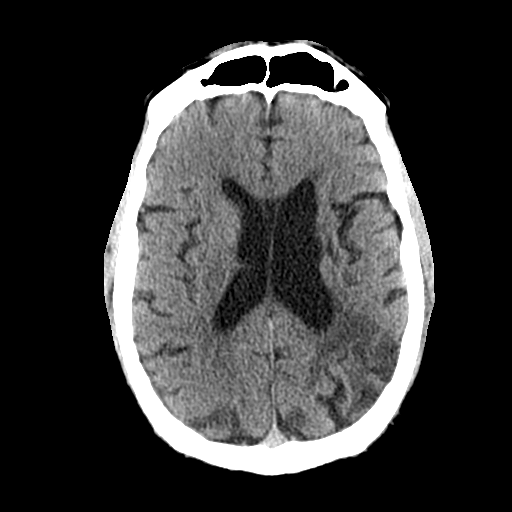
[im 19/34  bone]
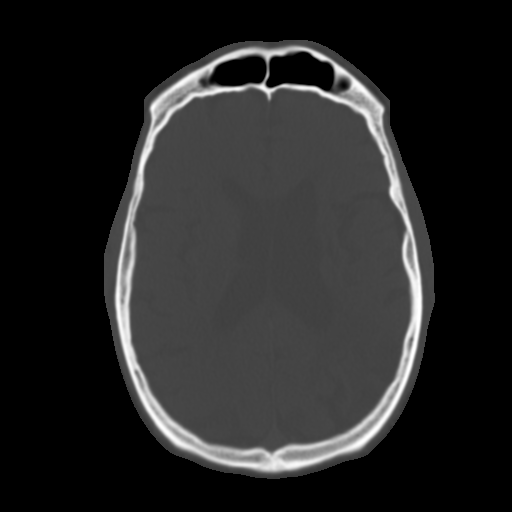
[im 23/34  brain]
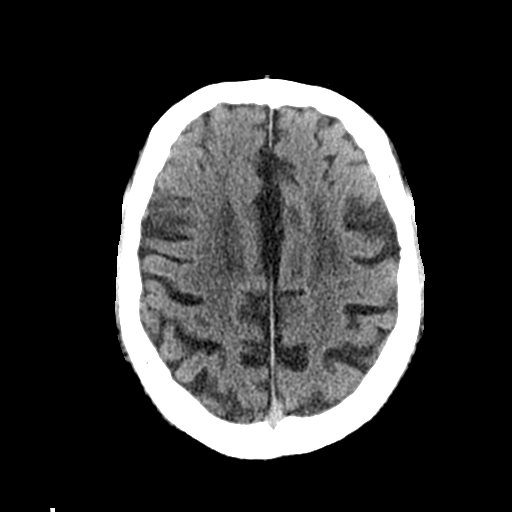
[im 26/34  brain]
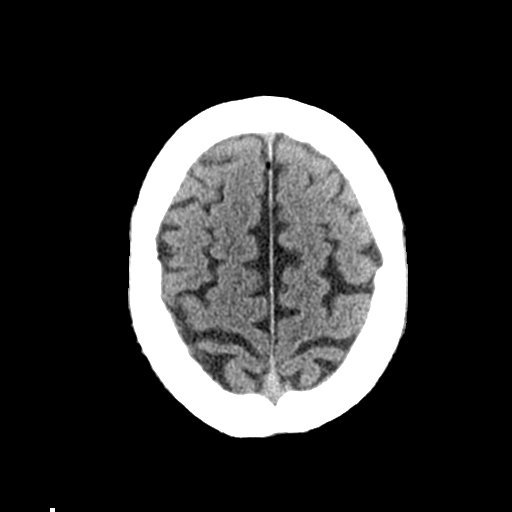
[im 30/34  brain]
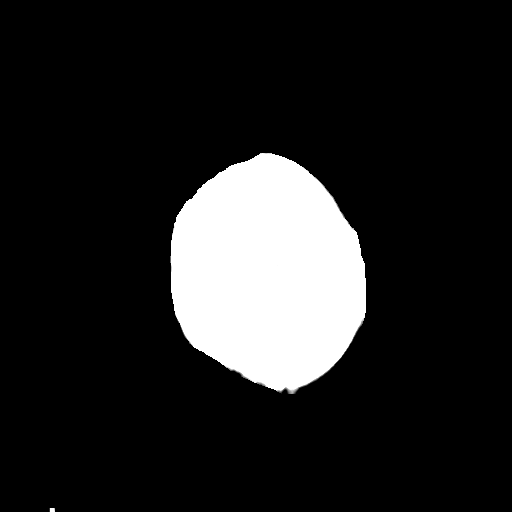

[Series 8: coronal soft tissue · coronal · 0.30mm/px · 3 of 75 slices shown]
[im 15/75  brain]
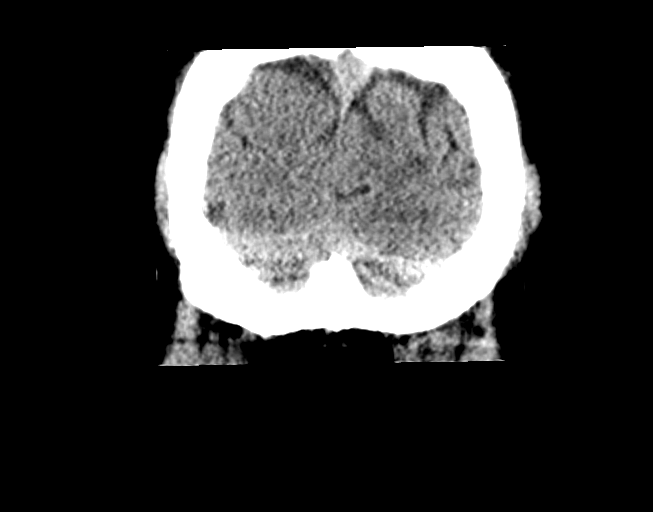
[im 30/75  brain]
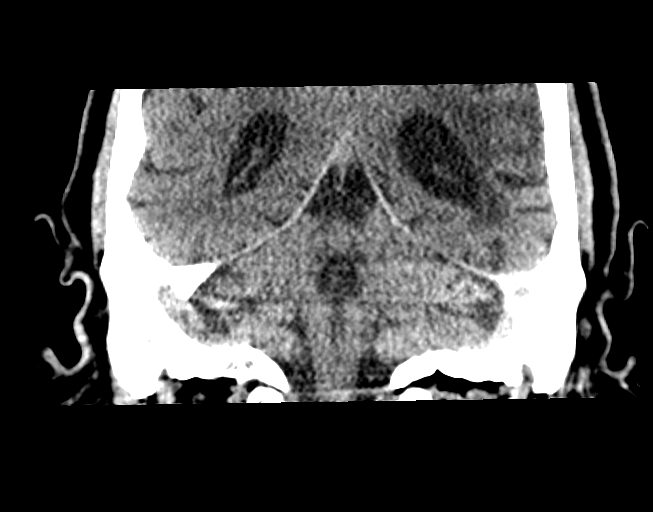
[im 45/75  brain]
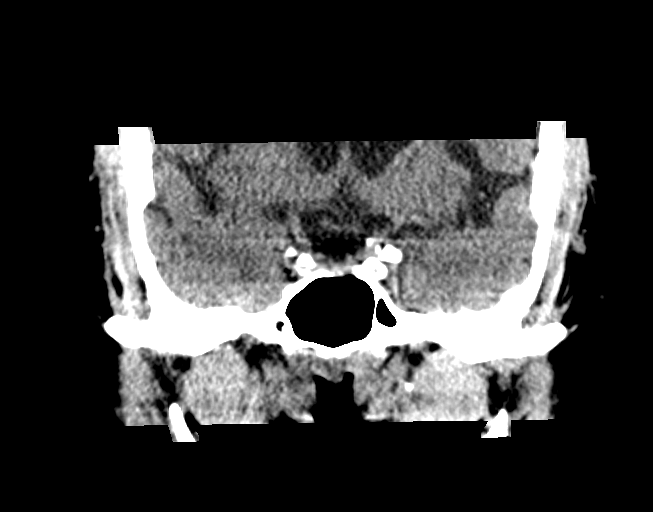

[Series 9: sagittal soft tissue · sagittal · 0.23mm/px · 2 of 67 slices shown]
[im 23/67  brain]
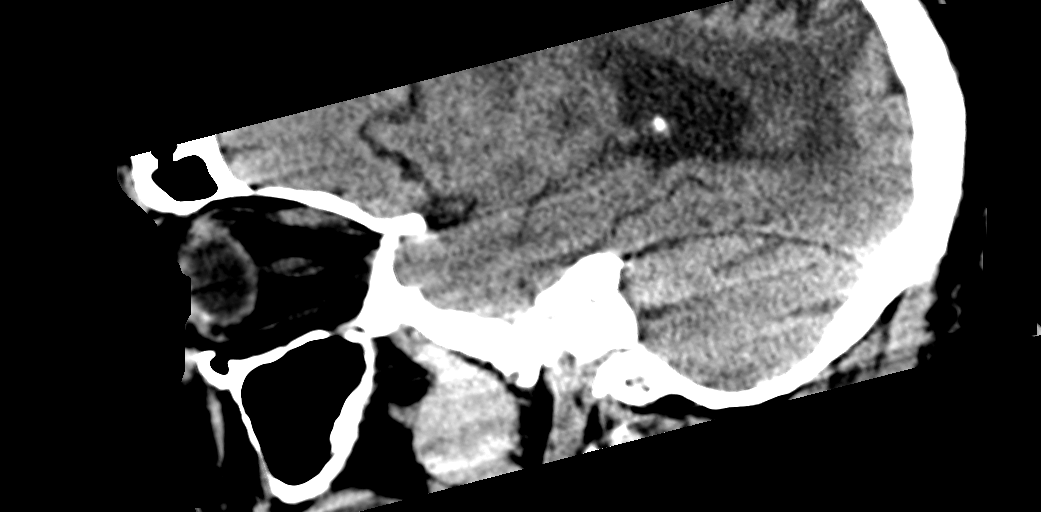
[im 45/67  brain]
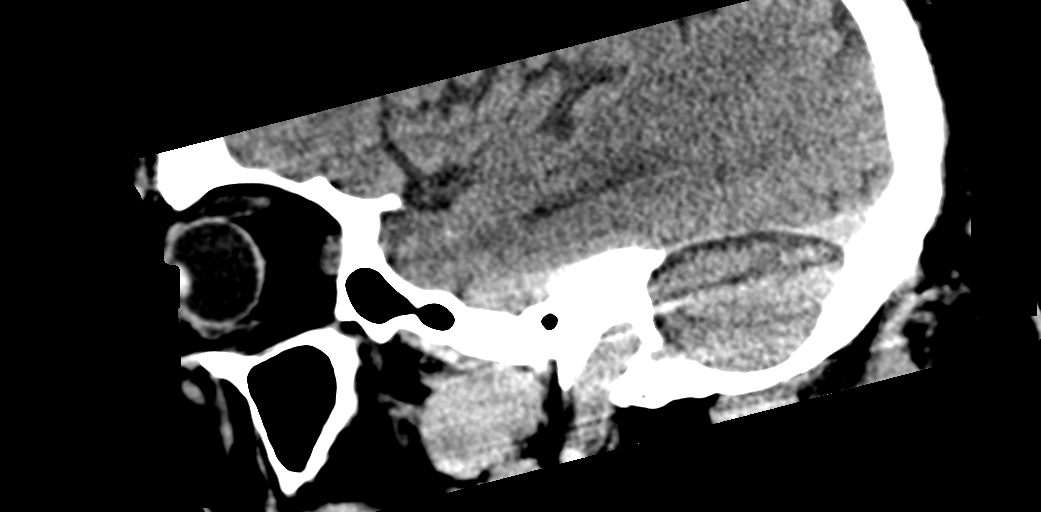

[13 of 47 positions shown; findings below may reference images not displayed]

FINDINGS: Study is intermittently degraded by motion artifact despite repeated
imaging attempts.

Brain: Sequelae of extensive ischemic disease in the left hemisphere
appears stable since the CT yesterday and similar to that on the
7065 MRI. No associated hemorrhage or mass effect, and there is mild
ex vacuo enlargement of the left lateral ventricle.

Chronic lacunar infarct in the right thalamus is stable since 7065
with otherwise preserved right hemisphere gray-white matter
differentiation. Wallerian degeneration in the brainstem.

Vascular: Calcified atherosclerosis at the skull base. No suspicious
intracranial vascular hyperdensity.

Skull: No acute osseous abnormality identified.

Sinuses/Orbits: Bilateral mastoid effusions and opacified left
tympanic cavity redemonstrated and are progressed since 7065.
Paranasal sinuses remain well pneumatized.

Other: No acute orbit or scalp soft tissue finding.
IMPRESSION: 1. No definite acute intracranial abnormality.
2. Stable CT appearance of the brain since 5757 hours today with
widespread ischemic sequelae in the left hemisphere
3. Bilateral mastoid effusions and opacified left tympanic cavity
re-demonstrated and progressed since [DATE].
